# Patient Record
Sex: Female | Born: 1940 | Race: White | Hispanic: No | Smoking: Former smoker
Health system: Southern US, Community
[De-identification: ages and names within clinical notes are randomized; demographics above are authoritative.]

## PROBLEM LIST (undated history)

## (undated) DIAGNOSIS — F32A Depression, unspecified: Secondary | ICD-10-CM

## (undated) DIAGNOSIS — D649 Anemia, unspecified: Secondary | ICD-10-CM

## (undated) DIAGNOSIS — E119 Type 2 diabetes mellitus without complications: Secondary | ICD-10-CM

## (undated) DIAGNOSIS — E039 Hypothyroidism, unspecified: Secondary | ICD-10-CM

## (undated) DIAGNOSIS — Z85038 Personal history of other malignant neoplasm of large intestine: Secondary | ICD-10-CM

## (undated) DIAGNOSIS — C801 Malignant (primary) neoplasm, unspecified: Secondary | ICD-10-CM

## (undated) DIAGNOSIS — N840 Polyp of corpus uteri: Secondary | ICD-10-CM

## (undated) DIAGNOSIS — F329 Major depressive disorder, single episode, unspecified: Secondary | ICD-10-CM

## (undated) DIAGNOSIS — E538 Deficiency of other specified B group vitamins: Secondary | ICD-10-CM

## (undated) HISTORY — DX: Type 2 diabetes mellitus without complications: E11.9

## (undated) HISTORY — PX: CHOLECYSTECTOMY: SHX55

## (undated) HISTORY — DX: Personal history of other malignant neoplasm of large intestine: Z85.038

## (undated) HISTORY — DX: Malignant (primary) neoplasm, unspecified: C80.1

## (undated) HISTORY — DX: Deficiency of other specified B group vitamins: E53.8

## (undated) HISTORY — DX: Anemia, unspecified: D64.9

## (undated) HISTORY — DX: Depression, unspecified: F32.A

## (undated) HISTORY — DX: Hypothyroidism, unspecified: E03.9

## (undated) HISTORY — DX: Polyp of corpus uteri: N84.0

## (undated) HISTORY — DX: Major depressive disorder, single episode, unspecified: F32.9

---

## 1995-10-08 HISTORY — PX: BREAST BIOPSY: SHX20

## 2000-05-02 ENCOUNTER — Encounter: Payer: Self-pay | Admitting: General Surgery

## 2000-05-08 ENCOUNTER — Encounter (INDEPENDENT_AMBULATORY_CARE_PROVIDER_SITE_OTHER): Payer: Self-pay | Admitting: Specialist

## 2000-05-08 ENCOUNTER — Encounter: Payer: Self-pay | Admitting: General Surgery

## 2000-05-09 ENCOUNTER — Encounter: Payer: Self-pay | Admitting: General Surgery

## 2000-05-09 ENCOUNTER — Inpatient Hospital Stay (HOSPITAL_COMMUNITY): Admission: RE | Admit: 2000-05-09 | Discharge: 2000-05-10 | Payer: Self-pay | Admitting: General Surgery

## 2000-09-23 ENCOUNTER — Other Ambulatory Visit: Admission: RE | Admit: 2000-09-23 | Discharge: 2000-09-23 | Payer: Self-pay | Admitting: Obstetrics and Gynecology

## 2004-08-17 ENCOUNTER — Other Ambulatory Visit: Admission: RE | Admit: 2004-08-17 | Discharge: 2004-08-17 | Payer: Self-pay | Admitting: Obstetrics and Gynecology

## 2005-03-18 ENCOUNTER — Emergency Department (HOSPITAL_COMMUNITY): Admission: EM | Admit: 2005-03-18 | Discharge: 2005-03-19 | Payer: Self-pay | Admitting: Emergency Medicine

## 2006-07-23 DIAGNOSIS — Z85038 Personal history of other malignant neoplasm of large intestine: Secondary | ICD-10-CM

## 2006-07-23 HISTORY — PX: COLON SURGERY: SHX602

## 2006-07-23 HISTORY — DX: Personal history of other malignant neoplasm of large intestine: Z85.038

## 2007-01-22 ENCOUNTER — Ambulatory Visit: Payer: Self-pay | Admitting: Internal Medicine

## 2007-01-27 ENCOUNTER — Ambulatory Visit: Payer: Self-pay | Admitting: Internal Medicine

## 2007-01-27 LAB — CONVERTED CEMR LAB
ALT: 12 units/L (ref 0–35)
Albumin: 3.2 g/dL — ABNORMAL LOW (ref 3.5–5.2)
BUN: 17 mg/dL (ref 6–23)
Basophils Absolute: 0.1 10*3/uL (ref 0.0–0.1)
Basophils Relative: 1.1 % — ABNORMAL HIGH (ref 0.0–1.0)
Bilirubin, Direct: 0.1 mg/dL (ref 0.0–0.3)
CO2: 27 meq/L (ref 19–32)
Chloride: 113 meq/L — ABNORMAL HIGH (ref 96–112)
Cholesterol: 162 mg/dL (ref 0–200)
Creatinine, Ser: 1 mg/dL (ref 0.4–1.2)
Eosinophils Relative: 6.3 % — ABNORMAL HIGH (ref 0.0–5.0)
Glucose, Bld: 125 mg/dL — ABNORMAL HIGH (ref 70–99)
HCT: 20.8 % — CL (ref 36.0–46.0)
Hemoglobin, Urine: NEGATIVE
Ketones, ur: NEGATIVE mg/dL
LDL Cholesterol: 114 mg/dL — ABNORMAL HIGH (ref 0–99)
Lymphocytes Relative: 25.9 % (ref 12.0–46.0)
MCHC: 30.2 g/dL (ref 30.0–36.0)
Monocytes Relative: 10 % (ref 3.0–11.0)
Platelets: 557 10*3/uL — ABNORMAL HIGH (ref 150–400)
Potassium: 4.7 meq/L (ref 3.5–5.1)
Sodium: 142 meq/L (ref 135–145)
TSH: 6.82 microintl units/mL — ABNORMAL HIGH (ref 0.35–5.50)
Total Bilirubin: 0.5 mg/dL (ref 0.3–1.2)
Total CHOL/HDL Ratio: 5.4
Total Protein: 6.9 g/dL (ref 6.0–8.3)
WBC: 7.6 10*3/uL (ref 4.5–10.5)
pH: 6 (ref 5.0–8.0)

## 2007-01-29 ENCOUNTER — Inpatient Hospital Stay (HOSPITAL_COMMUNITY): Admission: EM | Admit: 2007-01-29 | Discharge: 2007-01-31 | Payer: Self-pay | Admitting: Emergency Medicine

## 2007-01-29 ENCOUNTER — Ambulatory Visit: Payer: Self-pay | Admitting: Internal Medicine

## 2007-01-30 ENCOUNTER — Ambulatory Visit: Payer: Self-pay | Admitting: Internal Medicine

## 2007-01-31 ENCOUNTER — Encounter (INDEPENDENT_AMBULATORY_CARE_PROVIDER_SITE_OTHER): Payer: Self-pay | Admitting: Gastroenterology

## 2007-04-03 ENCOUNTER — Encounter: Admission: RE | Admit: 2007-04-03 | Discharge: 2007-04-03 | Payer: Self-pay | Admitting: Gastroenterology

## 2007-04-18 ENCOUNTER — Ambulatory Visit: Payer: Self-pay | Admitting: Internal Medicine

## 2007-04-19 ENCOUNTER — Encounter: Payer: Self-pay | Admitting: Internal Medicine

## 2007-04-19 DIAGNOSIS — D509 Iron deficiency anemia, unspecified: Secondary | ICD-10-CM

## 2007-04-19 DIAGNOSIS — E039 Hypothyroidism, unspecified: Secondary | ICD-10-CM

## 2007-05-06 ENCOUNTER — Inpatient Hospital Stay (HOSPITAL_COMMUNITY): Admission: RE | Admit: 2007-05-06 | Discharge: 2007-05-12 | Payer: Self-pay | Admitting: General Surgery

## 2007-05-06 ENCOUNTER — Encounter (INDEPENDENT_AMBULATORY_CARE_PROVIDER_SITE_OTHER): Payer: Self-pay | Admitting: General Surgery

## 2007-05-30 ENCOUNTER — Ambulatory Visit: Payer: Self-pay | Admitting: Hematology and Oncology

## 2007-06-03 ENCOUNTER — Encounter: Payer: Self-pay | Admitting: Internal Medicine

## 2007-06-03 LAB — CBC WITH DIFFERENTIAL/PLATELET
BASO%: 0.5 % (ref 0.0–2.0)
Eosinophils Absolute: 0.4 10*3/uL (ref 0.0–0.5)
HCT: 38 % (ref 34.8–46.6)
MCHC: 34 g/dL (ref 32.0–36.0)
MONO#: 0.6 10*3/uL (ref 0.1–0.9)
NEUT#: 5.3 10*3/uL (ref 1.5–6.5)
NEUT%: 65.6 % (ref 39.6–76.8)
RBC: 4.67 10*6/uL (ref 3.70–5.32)
WBC: 8 10*3/uL (ref 3.9–10.0)
lymph#: 1.8 10*3/uL (ref 0.9–3.3)

## 2007-06-03 LAB — CEA: CEA: 1.4 ng/mL (ref 0.0–5.0)

## 2007-06-03 LAB — COMPREHENSIVE METABOLIC PANEL
ALT: 18 U/L (ref 0–35)
CO2: 24 mEq/L (ref 19–32)
Calcium: 10 mg/dL (ref 8.4–10.5)
Chloride: 103 mEq/L (ref 96–112)
Sodium: 141 mEq/L (ref 135–145)
Total Protein: 7.6 g/dL (ref 6.0–8.3)

## 2007-06-10 ENCOUNTER — Ambulatory Visit (HOSPITAL_COMMUNITY): Admission: RE | Admit: 2007-06-10 | Discharge: 2007-06-10 | Payer: Self-pay | Admitting: Hematology and Oncology

## 2007-07-29 ENCOUNTER — Encounter: Admission: RE | Admit: 2007-07-29 | Discharge: 2007-07-29 | Payer: Self-pay | Admitting: Obstetrics and Gynecology

## 2007-09-01 ENCOUNTER — Ambulatory Visit: Payer: Self-pay | Admitting: Hematology and Oncology

## 2007-09-04 LAB — CBC WITH DIFFERENTIAL/PLATELET
Basophils Absolute: 0 10*3/uL (ref 0.0–0.1)
Eosinophils Absolute: 0.4 10*3/uL (ref 0.0–0.5)
HCT: 38.5 % (ref 34.8–46.6)
HGB: 12.6 g/dL (ref 11.6–15.9)
LYMPH%: 22 % (ref 14.0–48.0)
MCV: 83.9 fL (ref 81.0–101.0)
MONO#: 0.6 10*3/uL (ref 0.1–0.9)
MONO%: 6.7 % (ref 0.0–13.0)
NEUT#: 5.8 10*3/uL (ref 1.5–6.5)
NEUT%: 65.9 % (ref 39.6–76.8)
Platelets: 391 10*3/uL (ref 145–400)
WBC: 8.9 10*3/uL (ref 3.9–10.0)

## 2007-09-05 LAB — COMPREHENSIVE METABOLIC PANEL
ALT: 20 U/L (ref 0–35)
BUN: 14 mg/dL (ref 6–23)
CO2: 25 mEq/L (ref 19–32)
Calcium: 9.6 mg/dL (ref 8.4–10.5)
Chloride: 103 mEq/L (ref 96–112)
Creatinine, Ser: 0.89 mg/dL (ref 0.40–1.20)
Glucose, Bld: 89 mg/dL (ref 70–99)

## 2007-09-05 LAB — CEA: CEA: 1.5 ng/mL (ref 0.0–5.0)

## 2007-09-11 ENCOUNTER — Encounter: Payer: Self-pay | Admitting: Internal Medicine

## 2007-12-25 ENCOUNTER — Ambulatory Visit: Payer: Self-pay | Admitting: Hematology and Oncology

## 2007-12-29 ENCOUNTER — Ambulatory Visit (HOSPITAL_COMMUNITY): Admission: RE | Admit: 2007-12-29 | Discharge: 2007-12-29 | Payer: Self-pay | Admitting: Hematology and Oncology

## 2007-12-29 LAB — CBC WITH DIFFERENTIAL/PLATELET
BASO%: 0.4 % (ref 0.0–2.0)
Eosinophils Absolute: 0.2 10*3/uL (ref 0.0–0.5)
MCHC: 34.2 g/dL (ref 32.0–36.0)
MONO#: 0.6 10*3/uL (ref 0.1–0.9)
NEUT#: 4.2 10*3/uL (ref 1.5–6.5)
RBC: 4.54 10*6/uL (ref 3.70–5.32)
RDW: 13.6 % (ref 11.3–14.5)
WBC: 6.7 10*3/uL (ref 3.9–10.0)

## 2007-12-29 LAB — COMPREHENSIVE METABOLIC PANEL
ALT: 27 U/L (ref 0–35)
Albumin: 4 g/dL (ref 3.5–5.2)
Alkaline Phosphatase: 90 U/L (ref 39–117)
CO2: 26 mEq/L (ref 19–32)
Glucose, Bld: 114 mg/dL — ABNORMAL HIGH (ref 70–99)
Potassium: 4.5 mEq/L (ref 3.5–5.3)
Sodium: 137 mEq/L (ref 135–145)
Total Bilirubin: 0.7 mg/dL (ref 0.3–1.2)
Total Protein: 7.6 g/dL (ref 6.0–8.3)

## 2008-01-01 ENCOUNTER — Encounter: Payer: Self-pay | Admitting: Internal Medicine

## 2008-04-29 ENCOUNTER — Telehealth: Payer: Self-pay | Admitting: Internal Medicine

## 2008-05-07 ENCOUNTER — Ambulatory Visit: Payer: Self-pay | Admitting: Internal Medicine

## 2008-05-12 ENCOUNTER — Encounter: Payer: Self-pay | Admitting: Internal Medicine

## 2008-05-21 ENCOUNTER — Ambulatory Visit: Payer: Self-pay | Admitting: Internal Medicine

## 2008-05-26 ENCOUNTER — Ambulatory Visit: Payer: Self-pay | Admitting: Internal Medicine

## 2008-05-26 DIAGNOSIS — Z85038 Personal history of other malignant neoplasm of large intestine: Secondary | ICD-10-CM | POA: Insufficient documentation

## 2008-06-22 ENCOUNTER — Encounter: Payer: Self-pay | Admitting: Internal Medicine

## 2008-06-24 ENCOUNTER — Ambulatory Visit: Payer: Self-pay | Admitting: Hematology and Oncology

## 2008-06-29 LAB — CBC WITH DIFFERENTIAL/PLATELET
Basophils Absolute: 0 10*3/uL (ref 0.0–0.1)
Eosinophils Absolute: 0.2 10*3/uL (ref 0.0–0.5)
HGB: 13 g/dL (ref 11.6–15.9)
MCV: 87.3 fL (ref 81.0–101.0)
MONO#: 0.7 10*3/uL (ref 0.1–0.9)
MONO%: 8.2 % (ref 0.0–13.0)
NEUT#: 5.4 10*3/uL (ref 1.5–6.5)
RDW: 13.6 % (ref 11.3–14.5)
WBC: 8 10*3/uL (ref 3.9–10.0)
lymph#: 1.8 10*3/uL (ref 0.9–3.3)

## 2008-06-30 LAB — COMPREHENSIVE METABOLIC PANEL
Albumin: 4.5 g/dL (ref 3.5–5.2)
BUN: 14 mg/dL (ref 6–23)
CO2: 26 mEq/L (ref 19–32)
Calcium: 9.6 mg/dL (ref 8.4–10.5)
Chloride: 104 mEq/L (ref 96–112)
Glucose, Bld: 95 mg/dL (ref 70–99)
Potassium: 3.9 mEq/L (ref 3.5–5.3)
Total Protein: 7.5 g/dL (ref 6.0–8.3)

## 2008-06-30 LAB — CEA: CEA: 1.4 ng/mL (ref 0.0–5.0)

## 2008-07-14 ENCOUNTER — Encounter: Payer: Self-pay | Admitting: Internal Medicine

## 2008-09-21 ENCOUNTER — Encounter: Admission: RE | Admit: 2008-09-21 | Discharge: 2008-09-21 | Payer: Self-pay | Admitting: Obstetrics and Gynecology

## 2008-11-24 ENCOUNTER — Ambulatory Visit: Payer: Self-pay | Admitting: Internal Medicine

## 2008-11-26 LAB — CONVERTED CEMR LAB
Albumin: 3.9 g/dL (ref 3.5–5.2)
Alkaline Phosphatase: 113 units/L (ref 39–117)
BUN: 11 mg/dL (ref 6–23)
Bilirubin, Direct: 0.2 mg/dL (ref 0.0–0.3)
CO2: 29 meq/L (ref 19–32)
Chloride: 110 meq/L (ref 96–112)
Creatinine, Ser: 0.8 mg/dL (ref 0.4–1.2)
GFR calc non Af Amer: 76.2 mL/min (ref >60)
Iron: 53 ug/dL (ref 42–145)
Potassium: 4.9 meq/L (ref 3.5–5.1)
Total Protein: 7.7 g/dL (ref 6.0–8.3)
Vitamin B-12: 392 pg/mL (ref 211–911)

## 2008-11-29 ENCOUNTER — Ambulatory Visit: Payer: Self-pay | Admitting: Internal Medicine

## 2008-12-30 ENCOUNTER — Ambulatory Visit: Payer: Self-pay | Admitting: Hematology and Oncology

## 2009-01-03 ENCOUNTER — Ambulatory Visit (HOSPITAL_COMMUNITY): Admission: RE | Admit: 2009-01-03 | Discharge: 2009-01-03 | Payer: Self-pay | Admitting: Hematology and Oncology

## 2009-01-03 LAB — COMPREHENSIVE METABOLIC PANEL
AST: 20 U/L (ref 0–37)
Albumin: 3.9 g/dL (ref 3.5–5.2)
Alkaline Phosphatase: 114 U/L (ref 39–117)
BUN: 10 mg/dL (ref 6–23)
Creatinine, Ser: 0.74 mg/dL (ref 0.40–1.20)
Potassium: 4.2 mEq/L (ref 3.5–5.3)

## 2009-01-03 LAB — CBC WITH DIFFERENTIAL/PLATELET
BASO%: 0.4 % (ref 0.0–2.0)
Basophils Absolute: 0 10*3/uL (ref 0.0–0.1)
EOS%: 2 % (ref 0.0–7.0)
HGB: 13.3 g/dL (ref 11.6–15.9)
MCH: 29.8 pg (ref 25.1–34.0)
MCHC: 34.7 g/dL (ref 31.5–36.0)
MCV: 85.9 fL (ref 79.5–101.0)
MONO%: 7.3 % (ref 0.0–14.0)
RDW: 13.6 % (ref 11.2–14.5)

## 2009-01-04 ENCOUNTER — Telehealth (INDEPENDENT_AMBULATORY_CARE_PROVIDER_SITE_OTHER): Payer: Self-pay | Admitting: *Deleted

## 2009-01-05 ENCOUNTER — Encounter: Payer: Self-pay | Admitting: Internal Medicine

## 2009-02-25 ENCOUNTER — Ambulatory Visit: Payer: Self-pay | Admitting: Internal Medicine

## 2009-02-25 LAB — CONVERTED CEMR LAB
Basophils Absolute: 0 10*3/uL (ref 0.0–0.1)
Chloride: 108 meq/L (ref 96–112)
Creatinine, Ser: 0.8 mg/dL (ref 0.4–1.2)
Eosinophils Absolute: 0.2 10*3/uL (ref 0.0–0.7)
Glucose, Bld: 118 mg/dL — ABNORMAL HIGH (ref 70–99)
Hemoglobin: 13 g/dL (ref 12.0–15.0)
Iron: 40 ug/dL — ABNORMAL LOW (ref 42–145)
MCV: 86.6 fL (ref 78.0–100.0)
Monocytes Absolute: 0.7 10*3/uL (ref 0.1–1.0)
Neutro Abs: 5.3 10*3/uL (ref 1.4–7.7)
Platelets: 323 10*3/uL (ref 150.0–400.0)
Potassium: 3.9 meq/L (ref 3.5–5.1)
RBC: 4.21 M/uL (ref 3.87–5.11)
RDW: 13.5 % (ref 11.5–14.6)
Saturation Ratios: 17.4 % — ABNORMAL LOW (ref 20.0–50.0)
Transferrin: 164.1 mg/dL — ABNORMAL LOW (ref 212.0–360.0)

## 2009-03-02 ENCOUNTER — Ambulatory Visit: Payer: Self-pay | Admitting: Internal Medicine

## 2009-03-02 DIAGNOSIS — R197 Diarrhea, unspecified: Secondary | ICD-10-CM | POA: Insufficient documentation

## 2009-03-02 DIAGNOSIS — L299 Pruritus, unspecified: Secondary | ICD-10-CM | POA: Insufficient documentation

## 2009-03-02 LAB — CONVERTED CEMR LAB
Hemoglobin, Urine: NEGATIVE
Ketones, ur: NEGATIVE mg/dL
Leukocytes, UA: NEGATIVE
Urine Glucose: NEGATIVE mg/dL
Urobilinogen, UA: 0.2 (ref 0.0–1.0)
pH: 6.5 (ref 5.0–8.0)

## 2009-03-04 ENCOUNTER — Encounter: Payer: Self-pay | Admitting: Internal Medicine

## 2009-03-04 LAB — CONVERTED CEMR LAB
5-HIAA, 24 Hr Urine: 2.8 mg/(24.h) (ref <=6.0)
AST: 24 units/L (ref 0–37)
Albumin: 4.1 g/dL (ref 3.5–5.2)
Alkaline Phosphatase: 122 units/L — ABNORMAL HIGH (ref 39–117)
Basophils Relative: 0 % (ref 0.0–3.0)
Bilirubin, Direct: 0.1 mg/dL (ref 0.0–0.3)
CO2: 29 meq/L (ref 19–32)
Dopamine 24 Hr Urine: 114 mcg/24hr (ref <500)
GFR calc non Af Amer: 66.46 mL/min (ref >60)
Hemoglobin: 13.1 g/dL (ref 12.0–15.0)
Metaneph Total, Ur: 328 ug/24hr (ref 224–832)
Metanephrines, Ur: 83 — ABNORMAL LOW (ref 90–315)
Norepinephrine 24 Hr Urine: 35 mcg/24hr (ref <80)
Normetanephrine, 24H Ur: 245 (ref 122–676)
RBC: 4.34 M/uL (ref 3.87–5.11)
Sodium: 138 meq/L (ref 135–145)
Total Protein: 7.7 g/dL (ref 6.0–8.3)
Vitamin B-12: 289 pg/mL (ref 211–911)

## 2009-04-04 ENCOUNTER — Ambulatory Visit: Payer: Self-pay | Admitting: Internal Medicine

## 2009-04-04 DIAGNOSIS — F329 Major depressive disorder, single episode, unspecified: Secondary | ICD-10-CM

## 2009-04-04 DIAGNOSIS — E538 Deficiency of other specified B group vitamins: Secondary | ICD-10-CM

## 2009-04-04 DIAGNOSIS — F3289 Other specified depressive episodes: Secondary | ICD-10-CM | POA: Insufficient documentation

## 2009-05-05 ENCOUNTER — Ambulatory Visit: Payer: Self-pay | Admitting: Internal Medicine

## 2009-09-30 ENCOUNTER — Ambulatory Visit: Payer: Self-pay | Admitting: Hematology and Oncology

## 2009-10-03 ENCOUNTER — Ambulatory Visit: Payer: Self-pay | Admitting: Internal Medicine

## 2009-10-04 LAB — COMPREHENSIVE METABOLIC PANEL
ALT: 15 U/L (ref 0–35)
AST: 16 U/L (ref 0–37)
Albumin: 4.4 g/dL (ref 3.5–5.2)
CO2: 28 mEq/L (ref 19–32)
Chloride: 102 mEq/L (ref 96–112)
Creatinine, Ser: 0.82 mg/dL (ref 0.40–1.20)
Glucose, Bld: 87 mg/dL (ref 70–99)
Potassium: 3.7 mEq/L (ref 3.5–5.3)
Sodium: 141 mEq/L (ref 135–145)
Total Bilirubin: 0.5 mg/dL (ref 0.3–1.2)
Total Protein: 7.3 g/dL (ref 6.0–8.3)

## 2009-10-04 LAB — CBC WITH DIFFERENTIAL/PLATELET
Basophils Absolute: 0 10*3/uL (ref 0.0–0.1)
EOS%: 3.8 % (ref 0.0–7.0)
HCT: 36.9 % (ref 34.8–46.6)
LYMPH%: 21.4 % (ref 14.0–49.7)
MCH: 30.5 pg (ref 25.1–34.0)
MONO%: 8.5 % (ref 0.0–14.0)
Platelets: 345 10*3/uL (ref 145–400)
lymph#: 1.7 10*3/uL (ref 0.9–3.3)

## 2009-10-05 LAB — CONVERTED CEMR LAB
ALT: 19 units/L (ref 0–35)
Alkaline Phosphatase: 104 units/L (ref 39–117)
BUN: 12 mg/dL (ref 6–23)
CO2: 29 meq/L (ref 19–32)
Chloride: 108 meq/L (ref 96–112)
Creatinine, Ser: 0.8 mg/dL (ref 0.4–1.2)
Glucose, Bld: 116 mg/dL — ABNORMAL HIGH (ref 70–99)
Potassium: 4 meq/L (ref 3.5–5.1)
Total Bilirubin: 0.5 mg/dL (ref 0.3–1.2)
Vitamin B-12: 327 pg/mL (ref 211–911)

## 2009-10-06 ENCOUNTER — Encounter: Payer: Self-pay | Admitting: Internal Medicine

## 2009-10-07 ENCOUNTER — Ambulatory Visit: Payer: Self-pay | Admitting: Internal Medicine

## 2009-10-07 DIAGNOSIS — Z87891 Personal history of nicotine dependence: Secondary | ICD-10-CM | POA: Insufficient documentation

## 2009-10-11 ENCOUNTER — Encounter: Admission: RE | Admit: 2009-10-11 | Discharge: 2009-10-11 | Payer: Self-pay | Admitting: Hematology and Oncology

## 2009-10-11 LAB — HM MAMMOGRAPHY: HM Mammogram: NEGATIVE

## 2010-02-03 ENCOUNTER — Ambulatory Visit: Payer: Self-pay | Admitting: Internal Medicine

## 2010-02-03 LAB — CONVERTED CEMR LAB
BUN: 16 mg/dL (ref 6–23)
Basophils Relative: 0.5 % (ref 0.0–3.0)
Eosinophils Absolute: 0.2 10*3/uL (ref 0.0–0.7)
Eosinophils Relative: 2.8 % (ref 0.0–5.0)
HCT: 35.6 % — ABNORMAL LOW (ref 36.0–46.0)
Hemoglobin: 12.4 g/dL (ref 12.0–15.0)
Hgb A1c MFr Bld: 6.1 % (ref 4.6–6.5)
MCV: 87.7 fL (ref 78.0–100.0)
Monocytes Absolute: 0.6 10*3/uL (ref 0.1–1.0)
Monocytes Relative: 9 % (ref 3.0–12.0)
Neutro Abs: 4.2 10*3/uL (ref 1.4–7.7)
Neutrophils Relative %: 61.4 % (ref 43.0–77.0)
Platelets: 297 10*3/uL (ref 150.0–400.0)

## 2010-02-07 ENCOUNTER — Ambulatory Visit: Payer: Self-pay | Admitting: Internal Medicine

## 2010-06-23 ENCOUNTER — Ambulatory Visit: Payer: Self-pay | Admitting: Hematology and Oncology

## 2010-06-27 ENCOUNTER — Ambulatory Visit (HOSPITAL_COMMUNITY)
Admission: RE | Admit: 2010-06-27 | Discharge: 2010-06-27 | Payer: Self-pay | Source: Home / Self Care | Attending: Hematology and Oncology | Admitting: Hematology and Oncology

## 2010-06-27 LAB — CBC WITH DIFFERENTIAL/PLATELET
Basophils Absolute: 0.1 10*3/uL (ref 0.0–0.1)
HCT: 38 % (ref 34.8–46.6)
HGB: 13 g/dL (ref 11.6–15.9)
MCH: 30.1 pg (ref 25.1–34.0)
MCV: 87.8 fL (ref 79.5–101.0)
MONO#: 0.7 10*3/uL (ref 0.1–0.9)
NEUT#: 6.6 10*3/uL — ABNORMAL HIGH (ref 1.5–6.5)
WBC: 9.7 10*3/uL (ref 3.9–10.3)

## 2010-06-27 LAB — COMPREHENSIVE METABOLIC PANEL
ALT: 22 U/L (ref 0–35)
AST: 21 U/L (ref 0–37)
Albumin: 4 g/dL (ref 3.5–5.2)
Calcium: 9.8 mg/dL (ref 8.4–10.5)
Chloride: 99 mEq/L (ref 96–112)
Potassium: 3.6 mEq/L (ref 3.5–5.3)
Sodium: 137 mEq/L (ref 135–145)
Total Bilirubin: 0.5 mg/dL (ref 0.3–1.2)
Total Protein: 7.6 g/dL (ref 6.0–8.3)

## 2010-06-27 LAB — CEA: CEA: 1.5 ng/mL (ref 0.0–5.0)

## 2010-08-24 NOTE — Assessment & Plan Note (Signed)
Summary: 6 mos f/u # cd   Vital Signs:  Patient profile:   70 year old female Weight:      160 pounds BMI:     29.37 Temp:     97.9 degrees F oral Pulse rate:   77 / minute BP sitting:   140 / 70  (left arm)  Vitals Entered By: Tora Perches (October 07, 2009 2:23 PM) CC: f/u Is Patient Diabetic? No   CC:  f/u.  History of Present Illness: The patient presents for a follow up of hypothyroidism, B 12 def, stress. Seeing a councellor.   Preventive Screening-Counseling & Management  Alcohol-Tobacco     Smoking Status: quit  Current Medications (verified): 1)  Synthroid 100 Mcg Tabs (Levothyroxine Sodium) .Marland Kitchen.. 1 Qd 2)  Vitamin B-12 Cr 1000 Mcg  Tbcr (Cyanocobalamin) .... Take One Tablet By Mouth Daily  Allergies: 1)  ! Codeine Phosphate (Codeine Phosphate) 2)  ! 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 ingidient B12 low 2010  Past Surgical History: Last updated: 05/26/2008 Cholecystectomy Colon resection 2008  Social History: Married - stressful marriage Former Smoker Retired Materials engineer  Review of Systems       stressed  Physical Exam  General:  overweight-appearing.   Nose:  External nasal examination shows no deformity or inflammation. Nasal mucosa are pink and moist without lesions or exudates. Mouth:  Oral mucosa and oropharynx without lesions or exudates.  Teeth in good repair. Lungs:  Normal respiratory effort, chest expands symmetrically. Lungs are clear to auscultation, no crackles or wheezes. Heart:  Normal rate and regular rhythm. S1 and S2 normal without gallop, murmur, click, rub or other extra sounds. Abdomen:  Bowel sounds positive,abdomen soft and non-tender without masses, organomegaly or hernias  noted. Msk:  No deformity or scoliosis noted of thoracic or lumbar spine.   Extremities:  No clubbing, cyanosis, edema, or deformity noted with normal full range of motion of all joints.   Neurologic:  No cranial nerve deficits noted. Station and gait are normal. Plantar reflexes are down-going bilaterally. DTRs are symmetrical throughout. Sensory, motor and coordinative functions appear intact. Skin:  Intact without suspicious lesions or rashes Psych:  Cognition and judgment appear intact. Alert and cooperative with normal attention span and concentration.not suicidal and depressed affect.     Impression & Recommendations:  Problem # 1:  DEPRESSION (ICD-311) Assessment Unchanged Refused Rx. Cont w/councelling  Problem # 2:  HYPOTHYROIDISM (ICD-244.9) Assessment: Unchanged  Her updated medication list for this problem includes:    Synthroid 100 Mcg Tabs (Levothyroxine sodium) .Marland Kitchen... 1 qd  Problem # 3:  ANEMIA-IRON DEFICIENCY (ICD-280.9)  The following medications were removed from the medication list:    Ferrous Sulfate 325 (65 Fe) Mg Tbec (Ferrous sulfate) .Marland Kitchen... 1 by mouth qd Her updated medication list for this problem includes:    Vitamin B-12 Cr 1000 Mcg Tbcr (Cyanocobalamin) .Marland Kitchen... Take one tablet by mouth daily  Problem # 4:  VITAMIN B12 DEFICIENCY (ICD-266.2) Assessment: Comment Only Risks of noncompliance with treatment discussed. Compliance encouraged.   Complete Medication List: 1)  Synthroid 100 Mcg Tabs (Levothyroxine sodium) .Marland Kitchen.. 1 qd 2)  Vitamin B-12 Cr 1000 Mcg Tbcr (Cyanocobalamin) .... Take one tablet by mouth daily 3)  Vitamin D 1000 Unit Tabs (Cholecalciferol) .Marland KitchenMarland KitchenMarland Kitchen 1  by mouth qd  Patient Instructions: 1)  Please schedule a follow-up appointment in 4 months. 2)  BMP prior to visit, ICD-9: 3)  CBC w/ Diff prior to visit, ICD-9: 4)  HbgA1C prior to visit, ICD-9:790.29  995.20 266.20 5)  Try to eat more raw plant food, fresh and dry fruit, raw almonds, leafy  vegetables, whole foods and less red meat, less animal fat. Poultry and fish is better for you than pork and beef. Avoid processed foods (canned soups, hot dogs, sausage, bacon , frozen dinners). Avoid corn syrup, high fructose syrup or aspartam and Splenda  containing drinks. Honey, Agave and Stevia are better sweeteners. Make your own  dressing with olive oil, wine vinegar, lemon juce, garlic etc. for your salads.

## 2010-08-24 NOTE — Assessment & Plan Note (Signed)
Summary: 4 mos f/u / #/ cd   Vital Signs:  Patient profile:   70 year old female Height:      62 inches (157.48 cm) Weight:      156 pounds (70.91 kg) BMI:     28.64 O2 Sat:      96 % on Room air Temp:     98.3 degrees F (36.83 degrees C) oral Pulse rate:   74 / minute Pulse rhythm:   regular Resp:     16 per minute BP sitting:   140 / 82  (right arm) Cuff size:   large  Vitals Entered By: Lanier Prude, CMA(AAMA) (February 07, 2010 1:28 PM)  O2 Flow:  Room air CC: 4 mo f/u Is Patient Diabetic? No Comments pt is not taking Vit B12   CC:  4 mo f/u.  History of Present Illness: The patient presents for a follow up of hypertension, depression, Vit B12 C/o stress   Current Medications (verified): 1)  Synthroid 100 Mcg Tabs (Levothyroxine Sodium) .Marland Kitchen.. 1 Qd 2)  Vitamin B-12 Cr 1000 Mcg  Tbcr (Cyanocobalamin) .... Take One Tablet By Mouth Daily 3)  Vitamin D 1000 Unit Tabs (Cholecalciferol) .Marland Kitchen.. 1 By Mouth Qd 4)  Calcium Gluconate 500 Mg Tabs (Calcium Gluconate) .... 2 Once Daily  Allergies (verified): 1)  ! Codeine Phosphate (Codeine Phosphate) 2)  ! Penicillin V Potassium (Penicillin V Potassium)  Past History:  Past Medical History: Last updated: 04/04/2009 Hypothyroidism Anemia-iron deficiency from GI bleed 08 Colon cancer, hx of 2008  Dr  Purnell Shoemaker, Dr [facility] Uterine polyp Dr Rana Snare Depression Food allergy to ? ingidient B12 low 2010  Past Surgical History: Last updated: 05/26/2008 Cholecystectomy Colon resection 2008  Social History: Last updated: 10/07/2009 Married - stressful marriage Former Smoker Retired Materials engineer  Review of Systems       The patient complains of depression.  The patient denies anorexia, fever, weight loss, weight gain, dyspnea on exertion, prolonged cough, and abdominal pain.    Physical Exam  General:  overweight-appearing.   Nose:  External nasal examination shows no deformity or inflammation. Nasal mucosa are pink  and moist without lesions or exudates. Mouth:  Oral mucosa and oropharynx without lesions or exudates.  Teeth in good repair. Neck:  No deformities, masses, or tenderness noted. Lungs:  Normal respiratory effort, chest expands symmetrically. Lungs are clear to auscultation, no crackles or wheezes. Heart:  Normal rate and regular rhythm. S1 and S2 normal without gallop, murmur, click, rub or other extra sounds. Abdomen:  Bowel sounds positive,abdomen soft and non-tender without masses, organomegaly or hernias noted. Msk:  No deformity or scoliosis noted of thoracic or lumbar spine.   Extremities:  No clubbing, cyanosis, edema, or deformity noted with normal full range of motion of all joints.   Neurologic:  No cranial nerve deficits noted. Station and gait are normal. Plantar reflexes are down-going bilaterally. DTRs are symmetrical throughout. Sensory, motor and coordinative functions appear intact. Skin:  Intact without suspicious lesions or rashes Psych:  Cognition and judgment appear intact. Alert and cooperative with normal attention span and concentration.not suicidal and depressed affect.     Impression & Recommendations:  Problem # 1:  VITAMIN B12 DEFICIENCY (ICD-266.2) Assessment Improved  Restart Rx  Orders: Vit B12 1000 mcg (J3420) Admin of Therapeutic Inj  intramuscular or subcutaneous (62130)  Problem # 2:  DEPRESSION (ICD-311) Assessment: Deteriorated  Her updated medication list for this problem includes:    Fluoxetine Hcl 10 Mg  Tabs (Fluoxetine hcl) .Marland Kitchen... 1 by mouth once daily    Lorazepam 0.5 Mg Tabs (Lorazepam) .Marland Kitchen... 1 by mouth two times a day as needed anxiety  Problem # 3:  HYPOTHYROIDISM (ICD-244.9) Assessment: Improved  Her updated medication list for this problem includes:    Synthroid 100 Mcg Tabs (Levothyroxine sodium) .Marland Kitchen... 1 qd  Problem # 4:  COLON CANCER, HX OF (ICD-V10.05) Assessment: Comment Only  Problem # 5:  ANEMIA-IRON DEFICIENCY  (ICD-280.9) Assessment: Improved  Her updated medication list for this problem includes:    Vitamin B-12 Cr 1000 Mcg Tbcr (Cyanocobalamin) .Marland Kitchen... Take one tablet by mouth daily  Hgb: 12.4 (02/03/2010)   Hct: 35.6 (02/03/2010)   Platelets: 297.0 (02/03/2010) RBC: 4.06 (02/03/2010)   RDW: 13.8 (02/03/2010)   WBC: 6.8 (02/03/2010) MCV: 87.7 (02/03/2010)   MCHC: 34.9 (02/03/2010) Iron: 40 (02/25/2009)   % Sat: 17.4 (02/25/2009) B12: 327 (10/03/2009)   TSH: 2.08 (11/24/2008)  Complete Medication List: 1)  Synthroid 100 Mcg Tabs (Levothyroxine sodium) .Marland Kitchen.. 1 qd 2)  Vitamin B-12 Cr 1000 Mcg Tbcr (Cyanocobalamin) .... Take one tablet by mouth daily 3)  Vitamin D 1000 Unit Tabs (Cholecalciferol) .Marland Kitchen.. 1 by mouth qd 4)  Calcium Gluconate 500 Mg Tabs (Calcium gluconate) .... 2 once daily 5)  Fluoxetine Hcl 10 Mg Tabs (Fluoxetine hcl) .Marland Kitchen.. 1 by mouth once daily 6)  Lorazepam 0.5 Mg Tabs (Lorazepam) .Marland Kitchen.. 1 by mouth two times a day as needed anxiety  Patient Instructions: 1)  Please schedule a follow-up appointment in 3 months. Prescriptions: LORAZEPAM 0.5 MG TABS (LORAZEPAM) 1 by mouth two times a day as needed anxiety  #30 x 1   Entered and Authorized by:   Tresa Garter MD   Signed by:   Tresa Garter MD on 02/07/2010   Method used:   Print then Give to Patient   RxID:   [id number] FLUOXETINE HCL 10 MG TABS (FLUOXETINE HCL) 1 by mouth once daily  #30 x 6   Entered and Authorized by:   Tresa Garter MD   Signed by:   Tresa Garter MD on 02/07/2010   Method used:   Print then Give to Patient   RxID:   [id number]    Medication Administration  Injection # 1:    Medication: Vit B12 1000 mcg    Diagnosis: VITAMIN B12 DEFICIENCY (ICD-266.2)    Route: IM    Site: L deltoid    Exp Date: 10/22/2011    Lot #: [id number]    Mfr: [employer]    Patient tolerated injection without complications    Given by: Lanier Prude, CMA(AAMA) (February 07, 2010 2:18  PM)  Orders Added: 1)  Vit B12 1000 mcg [J3420] 2)  Admin of Therapeutic Inj  intramuscular or subcutaneous [96372] 3)  Est. Patient Level IV [29528]

## 2010-08-24 NOTE — Miscellaneous (Signed)
[facility]   Imported By: Lester Shelby 10/14/2009 09:44:05  _____________________________________________________________________  External Attachment:    Type:   Image     Comment:   External Document

## 2010-08-24 NOTE — Letter (Signed)
[facility]   Imported By: Lennie Odor 10/20/2009 15:29:17  _____________________________________________________________________  External Attachment:    Type:   Image     Comment:   External Document

## 2010-11-15 ENCOUNTER — Other Ambulatory Visit: Payer: Self-pay | Admitting: Internal Medicine

## 2010-11-15 DIAGNOSIS — Z1231 Encounter for screening mammogram for malignant neoplasm of breast: Secondary | ICD-10-CM

## 2010-11-24 ENCOUNTER — Ambulatory Visit
Admission: RE | Admit: 2010-11-24 | Discharge: 2010-11-24 | Disposition: A | Discharge status: Home / Self Care | Payer: Medicare Other | Source: Ambulatory Visit | Attending: Internal Medicine | Admitting: Internal Medicine

## 2010-11-24 DIAGNOSIS — Z1231 Encounter for screening mammogram for malignant neoplasm of breast: Secondary | ICD-10-CM

## 2010-11-29 ENCOUNTER — Other Ambulatory Visit: Payer: Self-pay | Admitting: Internal Medicine

## 2010-12-05 NOTE — Discharge Summary (Signed)
NAMECHRISHAWNA, Whitney [name]          ACCOUNT NO.:  [id number]   MEDICAL RECORD NO.:  [id number]          PATIENT TYPE:  INP   LOCATION:  1405                         FACILITY[facility]  [facility]   PHYSICIAN:  Georgina Quint. Plotnikov, MDDATE OF BIRTH:  21-Jan-1941   DATE OF ADMISSION:  01/29/2007  DATE OF DISCHARGE:                               DISCHARGE SUMMARY   DISCHARGE DIAGNOSES:  1. Profound anemia, likely of blood loss, unknown source.  2. Weakness and dyspnea due to problem #1.  3. Upper respiratory tract infection/bronchitis.  4. Hypothyroidism.   DISCHARGE MEDICATIONS:  1. Levoxyl 100 mg daily.  2. Levaquin to finish at home (three days left).  3. Protonix 40 mg daily for two months.  4. Niferex Forte 1 daily for anemia.   FOLLOW-UP PLANS:  1. Dr. Ewing Schlein in 1-3 weeks. Capsule video enteroscopy; colonoscopy      recommended as an outpatient.  2. Dr. Posey Rea in two months with blood work.   CONSULTATIONS:  GI consult, Dr. Matthias Hughs.   PROCEDURES:  EGD on January 31, 2007 with normal results.  RBC transfusion 4u.   DISCHARGE INSTRUCTIONS:  Increase activity slowly.  To work on February 03, 2007 if okay.  Call if problems.   HISTORY:  The patient is a 70 year old female who was hospitalized for  profound weakness and severe anemia.  It was thought that the source of  anemia was gastrointestinal blood loss.  Dr. Matthias Hughs saw the patient in  the hospital.  EGD was performed on the day of discharge without acute  findings.   DISCHARGE PHYSICAL EXAMINATION:  GENERAL:  On the day of discharge, she  is feeling well.  She is not in any distress.  VITAL SIGNS:  Temperature 96.7, heart rate 69, respirations 20, blood  pressure 129/68.  Sats 98% on room air. Hemoglobin 11.3.  HEENT:  No pallor.  Moist mucosa.  NECK:  Supple.  No thyromegaly or bruit.  LUNGS:  Clear.  No wheezes or rales.  HEART:  S1 and S2.  No murmur, no gallop.  ABDOMEN:  Soft and nontender.  No organomegaly.  No masses felt.  LOWER EXTREMITIES:  Without edema.  NEUROLOGIC:  She is alert, oriented, and cooperative.   LABS:  Normal.  Sodium 134, potassium 3.8, glucose 124.  White count  11.8; originally with a hemoglobin of 6.5.  Iron 10, TIBC 317, percent  saturation 3.      Georgina Quint. Plotnikov, MD  Electronically Signed     AVP/MEDQ  D:  01/31/2007  T:  02/01/2007  Job:  [id number]   cc:   Petra Kuba, M.D.  Fax: [phone]

## 2010-12-05 NOTE — Consult Note (Signed)
NAMEMICHALINA, Whitney [name]          ACCOUNT NO.:  [id number]   MEDICAL RECORD NO.:  [id number]          PATIENT TYPE:  INP   LOCATION:  1405                         [facility]ACILITY:  [facility]   PHYSICIAN:  Bernette Redbird, M.D.   DATE OF BIRTH:  1940/10/22   DATE OF CONSULTATION:  DATE OF DISCHARGE:                                 CONSULTATION   REFERRING PHYSICIAN:  Dr. Raenette Rover. Leschber.   REASON FOR CONSULTATION:  Dr. Matthias Hughs was asked to see Whitney [name]  today in consultation for severe iron deficiency anemia by Dr. Felicity Coyer  of [facility]   HISTORY OF PRESENT ILLNESS:  This is a 70 year old female with a history  of a cholecystectomy and subsequent ERCP for common bile duct stone.  She presented on July 9 with a hemoglobin of 6.4 and an MCV value of  61.9/  She reports abdominal pain in her left upper quadrant that she  associates with her chronic cough, congestion and sinus problems.  She  reports positive for fatigue and weakness for 2 months, chronic cough  for many months.  She says her cough awakens her from sleep and she  feels like she has continuous sinus drainage.  She denies any change in  her bowel habits; she says that her bowel habits are relatively  __________ .  She denies any melena or hematochezia as well as vomiting  and hematemesis.  She denies any dysphasia, indigestion or heartburn.  She denies use of any aspirin or NSAIDs including Alka-Seltzer.   PAST MEDICAL HISTORY:  Significant for a colonoscopy that was done by  Dr. Ewing Schlein on April 02, 2005, which was negative.  She has had a  previous cholecystectomy in October 2001 followed by a subsequent ERCP  for a common bile duct stone in October 2001.  She had a cesarean  section at 70 years old.  She has a history of osteoporosis and  hypothyroidism.   PRIMARY CARE PHYSICIAN:  Dr. Posey Rea.   CURRENT MEDICATIONS:  Include Levaquin, which she has almost completed,  Synthroid, calcium and a supplement called  Q-10.   ALLERGIES:  She has allergies to CODEINE and PENICILLIN.   REVIEW OF SYSTEMS:  Significant for her chronic sinus congestion and  cough.   SOCIAL HISTORY:  She denies any alcohol or tobacco.  She is married and  has 1 son.   FAMILY HISTORY:  Significant for a first cousin with colon cancer.  No  ulcer disease in her family, but her mother did have a hiatal hernia.   PHYSICAL EXAM:  GENERAL:  She is alert and oriented, very pleasant to  speak with.  VITAL SIGNS:  Her temperature is 97.5, pulse 63, respirations are 16 and  blood pressure is 123/66.  HEART:  Regular rate and rhythm with no murmurs, rubs or gallops  appreciated.  LUNGS:  Clear to auscultation with no wheezes or crackles appreciated.  ABDOMEN:  Soft, nontender and nondistended with good bowel sounds.  RECTAL:  She did have external hemorrhoids; they were nonthrombosed.  She has brown stool in her rectal vault which was guaiac-negative.   ACCESSORY CLINICAL DATA:  On January 22, 2007, she had a chest x-ray done  that shows no active cardiopulmonary disease.   ASSESSMENT:  Dr. Molly Maduro Buccini has seen and examined the patient and  collected a history.  He lists the following as her gastrointestinal  issues:  1. Profound microcytic anemia, heme-negative at present, negative      colon 2 years ago, no clear localizing symptoms and no use of      aspirin or nonsteroidal anti-inflammatory drugs.  2. Nocturnal cough possibly gastroesophageal reflux disease.  3. Fleeting abdominal pain.   PLAN:  1. Check serum tissue transglutaminase.  2. EGD in the morning with Dr. Charlott Rakes.  Consider small-bowel      biopsies, if EGD is negative.  Possibly an outpatient repeat      colonoscopy plus or minus a capsule endoscopy of the small bowel.  3. Continue proton pump inhibitor for 2 months.   Thank you very much for this consultation.      Stephani Police, PA    ______________________________  Bernette Redbird,  M.D.    MLY/MEDQ  D:  01/30/2007  T:  01/31/2007  Job:  [id number]   cc:   Georgina Quint. Plotnikov, MD  [address]  [place]  [place] [address]   Vikki Ports A. Felicity Coyer, MD  [address] [place], [place] [address]   Petra Kuba, M.D.  Fax: [phone]   Bernette Redbird, M.D.  Fax: [phone]

## 2010-12-05 NOTE — H&P (Signed)
[name], [name]          ACCOUNT NO.:  [id number]   MEDICAL RECORD NO.:  [id number]          PATIENT TYPE:  INP   LOCATION:  1405                         FACILITY:  [facility]   PHYSICIAN:  Whitney [name], MDDATE OF BIRTH:  [birth year]   DATE OF ADMISSION:  01/29/2007  DATE OF DISCHARGE:                              HISTORY & PHYSICAL   CHIEF COMPLAINT:  Weakness.   HISTORY OF PRESENT ILLNESS:  The patient is a 70 year old female who was  seen recently in the office with the complaint of being tired over a  period of the last 6-8 weeks.  Denies chest pain, short of breath with  exertion.  Some vague lower abdominal discomfort.  Denies black tarry-  looking stool or bright red blood in the stool.  No nausea or vomiting.  No significant indigestion.   MEDICATIONS:  1. Synthroid 100 mcg q day.  2. Folic acid.  3. No excessive antiinflammatories.   ALLERGIES:  1. PENICILLIN.  2. CODEINE.   PAST MEDICAL HISTORY:  1. Hypothyroidism.  2. Cholecystectomy in 2001.  3. History of elevated glucose.   SOCIAL HISTORY:  She is married.  Does not smoke or drink.   FAMILY HISTORY:  Positive for gallstones.   REVIEW OF SYSTEMS:  No chest pain.  Abdominal pain as described above.  No urinary symptoms.  No melena.  No bright red blood in bowel  movements.  No syncope. Colonoscopy 2 years ago or so. The rest of the  18-point review of systems is negative.   PHYSICAL EXAMINATION:  VITAL SIGNS:  Blood pressure 151/75, pulse 88,  temperature 96.9, weight 166 pounds.  GENERAL:  She is in no acute distress.  Appears tired and slightly pale.  HEENT:  With moist mucosa.  NECK:  Supple.  No thyromegaly or bruits.  LUNGS:  Clear.  No wheezes or rales.  HEART:  S1, S2.  Grade 2/6 systolic murmur.  Slight tachycardia.  ABDOMEN:  Soft, nondistended.  No masses felt.  Bowel sounds present.  RECTAL:  Declined.  LOWER EXTREMITIES:  Without edema.  Calves nontender.  SKIN:  Clear.  No  rashes.  NEUROLOGIC:  She is alert, oriented, and cooperative.   LABORATORIES:  On January 27, 2007, hemoglobin 6.3, MCV 62.3, platelets  567,000, white cell count 7600.  Glucose 125, potassium 4.7.  Liver  tests normal.  Cholesterol 162, LDL 114.  A1c 5.9%.  TSH 6.82.  Urinalysis normal.   ASSESSMENT AND PLAN:  1. Profound subacute anemia, microcytic, most likely related to GI      blood loss.  GI consultation, Dr. Ewing [name].  Will transfuse with 4      units of red blood cells.  Premedicate with Benadryl and Tylenol.  2. Hypothyroidism, with elevated TSH.  Continue with the same dose of      thyroid replacement at present.  3. Profound fatigue due to the above.  4. Abdominal discomfort, lower.  Plan as above.  5. Upper respiratory infection.  She is finishing Levaquin orally.      Will continue for another 3 days.      Whitney Quint. Plotnikov,  MD  Electronically Signed     AVP/MEDQ  D:  01/29/2007  T:  01/30/2007  Job:  [id number]

## 2010-12-05 NOTE — Op Note (Signed)
Whitney [name], Whitney [name]          ACCOUNT NO.:  [id number]   MEDICAL RECORD NO.:  [id number]          PATIENT TYPE:  INP   LOCATION:  1405                         [facility]   PHYSICIAN:  Shirley Friar, MDDATE OF BIRTH:  1941/02/10   DATE OF PROCEDURE:  DATE OF DISCHARGE:                               OPERATIVE REPORT   UPPER ENDOSCOPY:   INDICATION:  Anemia.   MEDICATIONS:  Fentanyl 75 mcg IV, Versed 6 mg IV.   FINDINGS:  The endoscope was inserted into the oropharynx and the  esophagus was intubated, which was normal in its entirety.  The GE  junction was noted to be approximately 40 cm from incisors.  The  endoscope was advanced into the stomach, which  revealed normal stomach  mucosa throughout the stomach.  Retroflexion was done, which revealed a  normal proximal stomach.  The endoscope was straightened and advanced  into the duodenal bulb, which was normal in appearance.  It was then  advanced into the second portion of duodenum, which revealed normal-  appearing mucosa.  Six random biopsies were taken in the second portion  of the duodenum.  The endoscope was withdrawn to confirmed above  findings.   ASSESSMENT:  Normal esophagogastroduodenoscopy, status post duodenal  biopsies to evaluate for celiac sprue.   PLAN:  1. Follow up on pathology.  2. If pathology unrevealing, then the patient likely will need a small      bowel capsule endoscopy to further evaluate anemia.  Also may need      to consider repeat colonoscopy in the near future.      Shirley Friar, MD  Electronically Signed     VCS/MEDQ  D:  01/31/2007  T:  02/01/2007  Job:  [id number]   cc:   Georgina Quint. Plotnikov, MD  [address]  [place]  [place] [address]   Petra Kuba, M.D.  Fax: [phone]

## 2010-12-05 NOTE — Op Note (Signed)
Whitney [name], Whitney [name]          ACCOUNT NO.:  [id number]   MEDICAL RECORD NO.:  [id number]          PATIENT TYPE:  INP   LOCATION:  1525                         FACILITY:  [facility]   PHYSICIAN:  Adolph Pollack, M.D.DATE OF BIRTH:  Mar 31, 1941   DATE OF PROCEDURE:  05/06/2007  DATE OF DISCHARGE:                               OPERATIVE REPORT   PREOPERATIVE DIAGNOSIS:  Cecal cancer.   POSTOPERATIVE DIAGNOSIS:  Cecal cancer.   PROCEDURE:  Laparoscopic-assisted right colectomy.   SURGEON:  Adolph Pollack, M.D.   ASSISTANT:  Ollen Gross. Vernell Morgans, M.D.   ANESTHESIA:  General.   INDICATIONS FOR PROCEDURE:  Whitney [name] is a 70 year old female with  progressively increasing fatigue and was noted be profoundly anemic.  Workup included endoscopies.  Colonoscopy demonstrated a lesion in the  cecum that was biopsied and positive for adenocarcinoma.  She now  presents for the above procedure.   TECHNIQUE:  She was seen in the holding area and then brought to the  operating room, placed on the operating table, and general anesthetic  was administered.  A Foley catheter was inserted.  The abdominal wall  was sterilely prepped and draped.   A small incision was made in the left upper quadrant subcostal region.  Using a 5-mm Optiview, access was gained into the peritoneal cavity and  pneumoperitoneum was created.  Careful inspection below the trocar site  demonstrated no injury to intra-abdominal organs and no bleeding.   Following this, a 5-mm trocar was placed in the left midabdomen, 1 in  the subumbilical region, and 1 in the right lower quadrant.  We began by  inspecting the right colon and cecum and mobilizing it by dividing its  lateral attachments using blunt dissection to sweep the right colon  medially.  There were omental adhesions to the liver from previous  laparoscopic cholecystectomy, and these were taken down sharply.  The  hepatic flexure was then mobilized by dividing  its peritoneal  attachments.  I was able to mobilize the colon from the right side to  the mid transverse area so it could be pulled under the umbilicus.   Following this, I removed the subumbilical trocar and made a  periumbilical extraction site incision, dividing the skin, subcutaneous  tissue, fascia, and peritoneum.  A wound protection device was placed.  The distal ileum, right colon, and proximal half of the transverse colon  were then extracted through the incision.  I picked 2 points of  division, 1 in the proximal third of the transverse colon and the other  in the distal ileum proximal to the ileocecal valve.  I then divided the  mesentery in a wedge-shape using the LigaSure device as well as tying  the right colic and ileocolic vessels.  Following this, I then created a  side-to-side anastomosis between the distal ileum and transverse colon  using the GIA stapler.  I closed the common defect and completed the  resection using the linear non-cutting stapler.  The specimen was handed  off the field.   A crotch suture was placed.  I inspected the anastomosis.  It was  patent, viable, and under no tension.  There was no bleeding.   Gloves were changed.  I dropped the intestine back into the abdominal  cavity.  Irrigation was performed and fluid evacuated.  Needle, sponge,  and instrument counts were reported to be correct at this time.   The extraction site fascia was then closed with running #1 PDS suture.  The abdomen was reinsufflated, and the laparoscope was reintroduced.  I  noted some blood clots in the right upper quadrant region where I  mobilized the omentum off the liver.  I evacuated all these, and there  was a little bit of bleeding from the some of the appendices epiploica  off the transverse colon and some of the omentum which I controlled with  electrocautery.  I irrigated this area and inspected it very carefully,  and hemostasis was now adequate.  I evacuated  as much of the irrigation  fluid as possible.  I inspected the midline closure, and it was solid.   Following this, I removed the trocars and released the pneumoperitoneum.  All skin incisions were then closed with staples, and sterile dressings  were applied.   She tolerated the procedure without any apparent complications and was  taken to recovery in satisfactory condition.      Adolph Pollack, M.D.  Electronically Signed     TJR/MEDQ  D:  05/06/2007  T:  05/06/2007  Job:  [id number]   cc:   Whitney [name], M.D.  Fax: [phone]   Whitney Quint. Plotnikov, MD  [address]  [place]  [place] [address]

## 2010-12-05 NOTE — Discharge Summary (Signed)
Whitney [name], Whitney [name]          ACCOUNT NO.:  [id number]   MEDICAL RECORD NO.:  [id number]          PATIENT TYPE:  INP   LOCATION:  1525                         FACILITY:  [facility]   PHYSICIAN:  Adolph Pollack, M.D.DATE OF BIRTH:  [birth year]   DATE OF ADMISSION:  05/06/2007  DATE OF DISCHARGE:  05/12/2007                               DISCHARGE SUMMARY   PRINCIPAL DIAGNOSIS:  Stage II cecal cancer.   SECONDARY DIAGNOSES:  1. Postoperative ileus.  2. Hypothyroidism.  3. Asthma.  4. Acute on chronic anemia.   PROCEDURE:  Laparoscopic-assisted right colectomy May 06, 2007.   REASON FOR ADMISSION:  This 70 year old female was noted to have anemia  and workup for the anemia included a colonoscopy.  She had a lesion in  the cecum that was biopsied and positive for adenocarcinoma.  She was  admitted for the above procedure.   HOSPITAL COURSE:  She underwent laparoscopic-assisted right colectomy  and tolerated it well.  She did have a low hemoglobin preop and 17.7  postop and was given blood transfusion and hemoglobin rose to 9.9.  Pathology was T3N0 lesion consistent with stage II.  She had somewhat of  an ileus but this slowly started to improve.  By her 6th postoperative  day she was having bowel function, tolerating her diet, wound was clean,  dry and intact and she was able to be discharged.   DISPOSITION:  Discharged to home in satisfactory condition on May 12, 2007.  She was given discharge instructions and analgesic.  She will  follow up in the office in 2 to 3 weeks.      Adolph Pollack, M.D.  Electronically Signed     TJR/MEDQ  D:  06/06/2007  T:  06/06/2007  Job:  [id number]   cc:   Petra Kuba, M.D.  Fax: [phone]   Georgina Quint. Plotnikov, MD  [address]  [place]  [place] [address]

## 2010-12-05 NOTE — Discharge Summary (Signed)
Whitney [name], Whitney [name]          ACCOUNT NO.:  [id number]   MEDICAL RECORD NO.:  [id number]          PATIENT TYPE:  INP   LOCATION:  1405                         FACILITY:  [facility]   PHYSICIAN:  Georgina Quint. Plotnikov, MDDATE OF BIRTH:  1941-02-15   DATE OF ADMISSION:  01/29/2007  DATE OF DISCHARGE:  01/31/2007                               DISCHARGE SUMMARY   ADDENDUM TO JOB #[id number]:  __________      Georgina Quint. Plotnikov, MD  Electronically Signed     AVP/MEDQ  D:  01/31/2007  T:  02/02/2007  Job:  [id number]   cc:   Petra Kuba, M.D.  Fax: [phone]

## 2010-12-08 NOTE — Op Note (Signed)
[place]  Patient:    Whitney [name], Whitney [name]                 MRN: [id number] Proc. Date: 05/08/00 Adm. Date:  [id number] Attending:  Glenna Fellows Tappan                           Operative Report  PREOPERATIVE DIAGNOSES: 1. Cholelithiasis, cholecystitis. 2. Abnormal liver function tests.  POSTOPERATIVE DIAGNOSES: 1. Cholelithiasis, cholecystitis. 2. Abnormal liver function tests.  SURGICAL PROCEDURE:  Laparoscopic cholecystectomy with interoperative cholangiogram and core needle biopsy of the liver.  SURGEON:  Lorne Skeens. Hoxworth, M.D.  ASSISTANT:  Dr. Derrell Lolling  HISTORY OF PRESENT ILLNESS:  Ms. Marga Hoots is a 70 year old white female who has an approximately 2 year history of gradually worsening episodic upper abdominal pain associated with nausea.  She has also recently been found to have elevated liver function tests in a cholestatic pattern.  She has had extensive workup including bilateral ultrasound which shows some rounded mobile filling defects with nonshadowing felt to be likely secondary to gallstones.  She has had an extensive work-up for abnormal liver function tests that has been unrevealing and she is felt likely to have a cholestatic process based on her pattern of elevated liver function tests rather than common duct obstruction.  With her symptoms and abnormal gallbladder ultrasound and elevated liver function tests we have elected to proceed with laparoscopic cholecystectomy with intraoperative cholangiogram and core needle liver biopsy.  INDICATIONS:  The nature of the procedures, its indications, risks of bleeding, infection, bile leak, bile duct injury were discussed and understood preoperatively.  She is now brought to the operating room for this procedure.  DESCRIPTION OF PROCEDURE:  The patient was brought to the operating room, placed in the supine position on the operating table and general  endotracheal anesthesia was induced.  The abdomen was sterilely prepped and draped.  PSA hose were in place.  The patient received antibiotics preoperatively.  Local anesthesia was used to infiltrate the trocar sites.  A 1 cm incision was made in the umbilicus and dissection carried down to the midline fascia.  This was sharply incised for 1 cm and the peritoneum identified under direct vision.  A Hasson trocar was placed with a pursestring suture of 0 Vicryl and pneumoperitoneum established.  Under direct vision a 10 mm trocar was placed in the sub-xiphoid and two 5 mm trocars, one in the right subcostal margin. There were some inflammatory appearing adhesions of omentum up to the gallbladder that were taken down sharply and with cautery.  The gallbladder appeared somewhat contracted and chronically thickened and inflamed.  The fundus was grasped and the infundibulum retracted inferolaterally.  Fibrofatty tissue was stripped off the neck of the gallbladder and the toward the porta hepatis.  The cystic duct was identified and was dissected free for about 1 cm and cystic duct gallbladder junction dissected 360 degrees.  Following this operative operative cholangiograms were obtained through the cystic duct.  This showed a upper limits of normal common bile duct and intrahepatic ducts with free flow into the duodenum.  There was a questionable small filling defect at the distal common bile duct which did not persist throughout the entire series.  Following this, the cholangiocatheter was removed and the cystic duct was doubly clipped proximally and divided.  The gallbladder was then dissected free from its bed using hook and spatula cautery.  Anterior and posterior  branches of the cystic artery were identified coursing up the gallbladder wall and were divided between clips.  There appeared to be a lot of chronic inflammation posteriorly along the gallbladder wall which made the  dissection somewhat tedious but the gallbladder was removed laparoscopically through an endocatch bag.  The gallbladder fossa was irrigated and complete hemostasis was assured with cautery and Surgicel pack placed.  Following this two core biopsies of the right lobe of the liver were obtained through a small separate skin stabilized wound.  Good cores were obtained. Bleeding was controlled with cautery and a Surgicel patch placed over this. The right upper quadrant was then irrigated thoroughly and inspected for hemostasis which appeared complete. Trocars were removed under direct vision, and all CO2 evacuated from the peritoneal cavity.  A pursestring suture was carried was secured at the umbilicus.  Skin incisions were closed with interrupted subcuticular 4-0 Monocryl and Steri-Strips.  Sponge, needle and instrument counts were correct.  Dry sterile dressing was applied, and the patient taken to the recovery in good condition. DD:  05/08/00 TD:  05/08/00 Job: [id number]

## 2010-12-08 NOTE — Procedures (Signed)
[facility]  Patient:    Whitney [name], Whitney [name]                 MRN: [id number] Adm. Date:  [id number] Attending:  Delsa Bern CC:         Lorne Skeens. Hoxworth, M.D.                           Procedure Report  PROCEDURE:  Endoscopic retrograde cholangiopancreatography.  INDICATIONS:  This 70 year old white female underwent laparoscopic cholecystectomy yesterday for cholelithiasis. Intraoperative cholangiogram showed a retained common bile duct stone in the distal common bile duct, which appeared to be of normal diameter, now undergoing ERCP with removal of stone.  ENDOSCOPE:  Olympus single-channel side-viewing duodenoscope.  SEDATION:  Versed 8 mg IV, Demerol 30 mg IV, glucagon 1.5 mg IV.  FINDINGS:  Olympus single-channel side-viewing duodenoscope was advanced blindly through the posterior pharynx, into the esophagus, and into the stomach. The gastric mucosa and pylorus were unremarkable. The duodenal bulb and papilla were located and appeared normal. The papilla did not appear to be enlarged or edematous. It was cannulated with a sphincterotome and cholangiogram was obtained with fluoroscopy showing normal size common bile duct. The stone could not be seen, but after endoscopic sphincterotomy and sweep over the common bile duct with an 8.5 mm balloon there was a recovery of a 3 to 5 mm stone with a few fragments surrounding it and thick dark bile. Occlusion cholangiogram showed normal common bile duct, normal intrahepatic radicals, no retained stones after sphincterotomy. The main pancreatic duct was avoided.  The patient tolerated the procedure well.  IMPRESSION:  Choledocholithiasis status post endoscopic sphincterotomy and removal of common bile duct stone.  PLAN:  Observation today. Advance diet as per Dr. Johna Sheriff. DD:  05/09/00 TD:  05/09/00 Job: [id number]

## 2011-02-22 ENCOUNTER — Other Ambulatory Visit: Payer: Self-pay | Admitting: Internal Medicine

## 2011-04-03 ENCOUNTER — Ambulatory Visit (INDEPENDENT_AMBULATORY_CARE_PROVIDER_SITE_OTHER)
Admission: RE | Admit: 2011-04-03 | Discharge: 2011-04-03 | Disposition: A | Discharge status: Home / Self Care | Payer: BC Managed Care – PPO | Source: Ambulatory Visit | Attending: Endocrinology | Admitting: Endocrinology

## 2011-04-03 ENCOUNTER — Encounter: Payer: Self-pay | Admitting: Endocrinology

## 2011-04-03 ENCOUNTER — Ambulatory Visit (INDEPENDENT_AMBULATORY_CARE_PROVIDER_SITE_OTHER): Payer: BC Managed Care – PPO | Admitting: Endocrinology

## 2011-04-03 VITALS — BP 126/72 | HR 81 | Temp 99.2°F | Ht 61.0 in | Wt 158.0 lb

## 2011-04-03 DIAGNOSIS — R059 Cough, unspecified: Secondary | ICD-10-CM | POA: Insufficient documentation

## 2011-04-03 DIAGNOSIS — R05 Cough: Secondary | ICD-10-CM

## 2011-04-03 MED ORDER — DOXYCYCLINE HYCLATE 100 MG PO TABS: 100.0000 mg | ORAL_TABLET | Freq: Two times a day (BID) | ORAL | Status: AC

## 2011-04-03 MED ORDER — BENZONATATE 100 MG PO CAPS: 100.0000 mg | ORAL_CAPSULE | Freq: Three times a day (TID) | ORAL | Status: AC | PRN

## 2011-04-03 NOTE — Progress Notes (Signed)
  Subjective:    Patient ID: Whitney [name], female    DOB: [birth year], 70 y.o.   MRN: [id number]  HPI Pt states 1 week of mod prod-quality cough in the chest, and assoc pain.  She has some nasal congestion.   Past Medical History  Diagnosis Date  . Hypothyroidism   . Anemia     Iron def  . History of colon cancer 2008  . Uterine polyp   . Depression   . B12 deficiency     Past Surgical History  Procedure Date  . Cholecystectomy   . Colon surgery 2008    Resection    History   Social History  . Marital Status: Married    Spouse Name: N/A    Number of Children: N/A  . Years of Education: N/A   Occupational History  . Not on file.   Social History Main Topics  . Smoking status: Former Games developer  . Smokeless tobacco: Not on file  . Alcohol Use:   . Drug Use:   . Sexually Active:    Other Topics Concern  . Not on file   Social History Narrative  . No narrative on file    Current Outpatient Prescriptions on File Prior to Visit  Medication Sig Dispense Refill  . SYNTHROID 100 MCG tablet TAKE ONE TABLET BY MOUTH ONE TIME DAILY  30 each  4  . FLUoxetine (PROZAC) 10 MG capsule TAKE ONE CAPSULE BY MOUTH EVERY DAY  90 capsule  1    Allergies  Allergen Reactions  . Codeine Phosphate   . Penicillins     Family History  Problem Relation Age of Onset  . Heart disease Other     CAD and CHF    BP 126/72  Pulse 81  Temp(Src) 99.2 F (37.3 C) (Oral)  Ht 5\' 1"  (1.549 m)  Wt 158 lb (71.668 kg)  BMI 29.85 kg/m2  SpO2 96%  Review of Systems She has wheezing, but no fever.      Objective:   Physical Exam VITAL SIGNS:  See vs page GENERAL: no distress head: no deformity eyes: no periorbital swelling, no proptosis external nose and ears are normal mouth: no lesion seen Both eac's and tm's are normal Neck: supple.  There is no palpable thyroid enlargement.  No thyroid nodule is palpable.  No palpable lymphadenopathy at the anterior neck. LUNGS:  Clear to  auscultation HEART: Regular rate and rhythm without murmurs noted. Normal S1,S2.     Cxr: early infiltrate    Assessment & Plan:  Glenford Peers, new Early pneumonia, new.  There is risk of progression Wheezing, new

## 2011-04-03 NOTE — Patient Instructions (Addendum)
Let's check a chest-x-ray.  please call [phone] to hear your test results.  You will be prompted to enter the 9-digit "MRN" number that appears at the top left of this page, followed by #.  Then you will hear the message. here is a sample of "symbicort-80."  take 1 puff 2x a day.  rinse mouth after using. i have sent a prescription to your pharmacy, for and antibiotic,and non-drowsy cough pills. I hope you feel better soon.  If you don't feel better by next week, please call doctor plotnikov. (update: i left message on phone-tree:  rx as we discussed.  If you get better in the next few days, see dr plotnikov in 1 month to recheck cxr.  If not, please call him in a few days).

## 2011-04-16 ENCOUNTER — Encounter: Payer: Self-pay | Admitting: Internal Medicine

## 2011-04-16 ENCOUNTER — Ambulatory Visit (INDEPENDENT_AMBULATORY_CARE_PROVIDER_SITE_OTHER): Payer: BC Managed Care – PPO | Admitting: Internal Medicine

## 2011-04-16 ENCOUNTER — Ambulatory Visit (INDEPENDENT_AMBULATORY_CARE_PROVIDER_SITE_OTHER)
Admission: RE | Admit: 2011-04-16 | Discharge: 2011-04-16 | Disposition: A | Discharge status: Home / Self Care | Payer: BC Managed Care – PPO | Source: Ambulatory Visit | Attending: Internal Medicine | Admitting: Internal Medicine

## 2011-04-16 VITALS — BP 118/70 | HR 80 | Temp 99.3°F | Resp 16 | Wt 157.0 lb

## 2011-04-16 DIAGNOSIS — E039 Hypothyroidism, unspecified: Secondary | ICD-10-CM

## 2011-04-16 DIAGNOSIS — R05 Cough: Secondary | ICD-10-CM

## 2011-04-16 DIAGNOSIS — E538 Deficiency of other specified B group vitamins: Secondary | ICD-10-CM

## 2011-04-16 DIAGNOSIS — Z0000 Encounter for general adult medical examination without abnormal findings: Secondary | ICD-10-CM

## 2011-04-16 DIAGNOSIS — J40 Bronchitis, not specified as acute or chronic: Secondary | ICD-10-CM | POA: Insufficient documentation

## 2011-04-16 MED ORDER — CHLORPHENIRAMINE-HYDROCODONE 8-10 MG/5ML PO LQCR: 5.0000 mL | Freq: Two times a day (BID) | ORAL | Status: DC | PRN

## 2011-04-16 MED ORDER — VITAMIN D 1000 UNITS PO TABS: 1000.0000 [IU] | ORAL_TABLET | Freq: Every day | ORAL | Status: DC

## 2011-04-16 MED ORDER — VITAMIN B-12 1000 MCG SL SUBL: 1.0000 | SUBLINGUAL_TABLET | Freq: Every day | SUBLINGUAL | Status: AC

## 2011-04-16 MED ORDER — MOXIFLOXACIN HCL 400 MG PO TABS: 400.0000 mg | ORAL_TABLET | Freq: Every day | ORAL | Status: AC

## 2011-04-16 MED ORDER — CYANOCOBALAMIN 1000 MCG/ML IJ SOLN
1000.0000 ug | Freq: Once | INTRAMUSCULAR | Status: AC
  Administered 2011-04-16: 1000 ug via INTRAMUSCULAR

## 2011-04-16 MED ORDER — BUDESONIDE-FORMOTEROL FUMARATE 80-4.5 MCG/ACT IN AERO: 1.0000 | INHALATION_SPRAY | Freq: Two times a day (BID) | RESPIRATORY_TRACT | Status: DC

## 2011-04-16 NOTE — Assessment & Plan Note (Signed)
Risks associated with treatment noncompliance were discussed. Compliance was encouraged. 

## 2011-04-16 NOTE — Assessment & Plan Note (Signed)
R/o pneumonia Avelox x 10 d CXR

## 2011-04-16 NOTE — Assessment & Plan Note (Signed)
Continue with current prescription therapy as reflected on the Med list.  

## 2011-04-16 NOTE — Progress Notes (Signed)
  Subjective:    Patient ID: Whitney [name], female    DOB: 01-08-1941, 70 y.o.   MRN: [id number]  HPI   HPI  C/o URI sx's x  14-17 days. C/o ST, cough, weakness. Not better with OTC medicines. Actually, the patient is getting worse. The patient did not sleep last night due to cough.  Review of Systems  Constitutional: Positive for fever, chills and fatigue.  HENT: Positive for congestion, rhinorrhea, sneezing and postnasal drip.   Eyes: Positive for photophobia and pain. Negative for discharge and visual disturbance.  Respiratory: Positive for cough and wheezing.   Positive for chest pain.  Gastrointestinal: Negative for vomiting, abdominal pain, diarrhea and abdominal distention.  Genitourinary: Negative for dysuria and difficulty urinating.  Skin: Negative for rash.  Neurological: Positive for dizziness, weakness and light-headedness.      Review of Systems     Objective:   Physical Exam  Constitutional: She appears well-developed and well-nourished. No distress.  HENT:  Head: Normocephalic.  Right Ear: External ear normal.  Left Ear: External ear normal.  Nose: Nose normal.  Mouth/Throat: Oropharynx is clear and moist.  Eyes: Conjunctivae are normal. Pupils are equal, round, and reactive to light. Right eye exhibits no discharge. Left eye exhibits no discharge.  Neck: Normal range of motion. Neck supple. No JVD present. No tracheal deviation present. No thyromegaly present.  Cardiovascular: Normal rate, regular rhythm and normal heart sounds.   Pulmonary/Chest: No stridor. No respiratory distress. She has wheezes. She has rales (L base).  Abdominal: Soft. Bowel sounds are normal. She exhibits no distension and no mass. There is no tenderness. There is no rebound and no guarding.  Musculoskeletal: She exhibits no edema and no tenderness.  Lymphadenopathy:    She has no cervical adenopathy.  Neurological: She displays normal reflexes. No cranial nerve deficit. She  exhibits normal muscle tone. Coordination normal.  Skin: No rash noted. No erythema.  Psychiatric: She has a normal mood and affect. Her behavior is normal. Judgment and thought content normal.          Assessment & Plan:

## 2011-04-16 NOTE — Patient Instructions (Signed)
Use over-the-counter  "cold" medicines  such as "Tylenol cold" , "Advil cold",  "Mucinex" or" Mucinex D"  for cough and congestion.   Avoid decongestants if you have high blood pressure and use "Afrin" nasal spray for nasal congestion as directed instead. Use" Delsym" or" Robitussin" cough syrup varietis for cough.  You can use plain "Tylenol" or "Advil" for fever, chills and achyness.  Please, make an appointment if you are not better or if you're worse.  

## 2011-04-16 NOTE — Assessment & Plan Note (Signed)
Symbicort Tussionex

## 2011-04-17 ENCOUNTER — Telehealth: Payer: Self-pay | Admitting: Internal Medicine

## 2011-04-17 NOTE — Telephone Encounter (Signed)
Called patient and notified per MD 

## 2011-04-17 NOTE — Telephone Encounter (Signed)
Misty Stanley, please, inform patient that her CXR was OK Thx

## 2011-05-02 LAB — CBC
HCT: 29.7 — ABNORMAL LOW
Hemoglobin: 7.7 — CL
Hemoglobin: 9.9 — ABNORMAL LOW
MCHC: 32.5
MCV: 84.3
Platelets: 291
RBC: 2.83 — ABNORMAL LOW
WBC: 11.1 — ABNORMAL HIGH

## 2011-05-02 LAB — BASIC METABOLIC PANEL
CO2: 27
Calcium: 7.8 — ABNORMAL LOW
Chloride: 102
GFR calc Af Amer: >60
Sodium: 132 — ABNORMAL LOW

## 2011-05-03 LAB — DIFFERENTIAL
Basophils Absolute: 0
Eosinophils Absolute: 0.3
Eosinophils Relative: 4

## 2011-05-03 LAB — CROSSMATCH: ABO/RH(D): O POS

## 2011-05-03 LAB — COMPREHENSIVE METABOLIC PANEL
ALT: 23
AST: 22
Alkaline Phosphatase: 86
CO2: 27
Chloride: 104
GFR calc non Af Amer: >60
Potassium: 3.8
Total Bilirubin: 0.2 — ABNORMAL LOW

## 2011-05-03 LAB — CBC
HCT: 29.1 — ABNORMAL LOW
Hemoglobin: 9.5 — ABNORMAL LOW
MCHC: 32.6
Platelets: 427 — ABNORMAL HIGH
RDW: 17.3 — ABNORMAL HIGH

## 2011-05-08 LAB — DIFFERENTIAL
Basophils Absolute: 0.1
Eosinophils Absolute: 0.3
Lymphs Abs: 1.8
Monocytes Absolute: 0.9 — ABNORMAL HIGH
Monocytes Relative: 8

## 2011-05-08 LAB — CBC
HCT: 20.8 — ABNORMAL LOW
HCT: 35 — ABNORMAL LOW
Hemoglobin: 11.3 — ABNORMAL LOW
Hemoglobin: 11.5 — ABNORMAL LOW
MCHC: 31.2
MCHC: 32.6
Platelets: 448 — ABNORMAL HIGH
Platelets: 513 — ABNORMAL HIGH
RDW: 17.2 — ABNORMAL HIGH
RDW: 25.8 — ABNORMAL HIGH
WBC: 8

## 2011-05-08 LAB — BASIC METABOLIC PANEL
BUN: 15
BUN: 17
Calcium: 8.8
GFR calc non Af Amer: 58 — ABNORMAL LOW
GFR calc non Af Amer: >60
Glucose, Bld: 124 — ABNORMAL HIGH
Potassium: 3.8
Potassium: 4
Sodium: 139

## 2011-05-08 LAB — TISSUE TRANSGLUTAMINASE, IGA: Tissue Transglutaminase Ab, IgA: 1.2 U/mL (ref <7)

## 2011-05-08 LAB — ABO/RH: ABO/RH(D): O POS

## 2011-05-08 LAB — CROSSMATCH

## 2011-05-08 LAB — VITAMIN B12: Vitamin B-12: 1123 — ABNORMAL HIGH (ref 211–911)

## 2011-05-08 LAB — PREPARE RBC (CROSSMATCH)

## 2011-05-08 LAB — IRON AND TIBC
Saturation Ratios: 3 — ABNORMAL LOW
Saturation Ratios: 57 — ABNORMAL HIGH
TIBC: 311
UIBC: 135

## 2011-05-18 ENCOUNTER — Other Ambulatory Visit: Payer: Self-pay | Admitting: Internal Medicine

## 2011-07-03 ENCOUNTER — Other Ambulatory Visit (INDEPENDENT_AMBULATORY_CARE_PROVIDER_SITE_OTHER): Payer: BC Managed Care – PPO

## 2011-07-03 ENCOUNTER — Other Ambulatory Visit: Payer: Self-pay | Admitting: Internal Medicine

## 2011-07-03 DIAGNOSIS — Z0000 Encounter for general adult medical examination without abnormal findings: Secondary | ICD-10-CM

## 2011-07-03 DIAGNOSIS — E538 Deficiency of other specified B group vitamins: Secondary | ICD-10-CM

## 2011-07-03 LAB — URINALYSIS, ROUTINE W REFLEX MICROSCOPIC
Hgb urine dipstick: NEGATIVE
Nitrite: NEGATIVE
Specific Gravity, Urine: 1.025 (ref 1.000–1.030)
Total Protein, Urine: NEGATIVE
Urine Glucose: NEGATIVE
Urobilinogen, UA: 0.2 (ref 0.0–1.0)

## 2011-07-03 LAB — COMPREHENSIVE METABOLIC PANEL
Albumin: 4 g/dL (ref 3.5–5.2)
Alkaline Phosphatase: 92 U/L (ref 39–117)
BUN: 21 mg/dL (ref 6–23)
CO2: 27 mEq/L (ref 19–32)
Calcium: 9.3 mg/dL (ref 8.4–10.5)
Chloride: 105 mEq/L (ref 96–112)
GFR: 82.72 mL/min (ref >60.00)
Glucose, Bld: 104 mg/dL — ABNORMAL HIGH (ref 70–99)
Potassium: 4.3 mEq/L (ref 3.5–5.1)
Sodium: 139 mEq/L (ref 135–145)
Total Protein: 7.7 g/dL (ref 6.0–8.3)

## 2011-07-03 LAB — CBC WITH DIFFERENTIAL/PLATELET
Basophils Relative: 0.4 % (ref 0.0–3.0)
Eosinophils Relative: 2.8 % (ref 0.0–5.0)
Lymphocytes Relative: 24.4 % (ref 12.0–46.0)
MCV: 86.4 fl (ref 78.0–100.0)
Monocytes Absolute: 0.6 10*3/uL (ref 0.1–1.0)
Monocytes Relative: 7.2 % (ref 3.0–12.0)
Neutrophils Relative %: 65.2 % (ref 43.0–77.0)
Platelets: 318 10*3/uL (ref 150.0–400.0)
RBC: 4.24 Mil/uL (ref 3.87–5.11)
WBC: 7.9 10*3/uL (ref 4.5–10.5)

## 2011-07-03 LAB — LIPID PANEL: HDL: 38.1 mg/dL — ABNORMAL LOW (ref >39.00)

## 2011-07-03 LAB — TSH: TSH: 2.35 u[IU]/mL (ref 0.35–5.50)

## 2011-07-03 LAB — VITAMIN B12: Vitamin B-12: 590 pg/mL (ref 211–911)

## 2011-07-04 LAB — LDL CHOLESTEROL, DIRECT: Direct LDL: 121 mg/dL

## 2011-07-09 ENCOUNTER — Ambulatory Visit (INDEPENDENT_AMBULATORY_CARE_PROVIDER_SITE_OTHER): Payer: BC Managed Care – PPO | Admitting: Internal Medicine

## 2011-07-09 ENCOUNTER — Encounter: Payer: Self-pay | Admitting: Internal Medicine

## 2011-07-09 DIAGNOSIS — Z23 Encounter for immunization: Secondary | ICD-10-CM

## 2011-07-09 DIAGNOSIS — E538 Deficiency of other specified B group vitamins: Secondary | ICD-10-CM

## 2011-07-09 DIAGNOSIS — R21 Rash and other nonspecific skin eruption: Secondary | ICD-10-CM

## 2011-07-09 DIAGNOSIS — F329 Major depressive disorder, single episode, unspecified: Secondary | ICD-10-CM

## 2011-07-09 DIAGNOSIS — E039 Hypothyroidism, unspecified: Secondary | ICD-10-CM

## 2011-07-09 DIAGNOSIS — F3289 Other specified depressive episodes: Secondary | ICD-10-CM

## 2011-07-09 DIAGNOSIS — D485 Neoplasm of uncertain behavior of skin: Secondary | ICD-10-CM

## 2011-07-09 DIAGNOSIS — Z0000 Encounter for general adult medical examination without abnormal findings: Secondary | ICD-10-CM | POA: Insufficient documentation

## 2011-07-09 MED ORDER — TRIAMCINOLONE ACETONIDE 0.5 % EX CREA: TOPICAL_CREAM | Freq: Three times a day (TID) | CUTANEOUS | Status: AC

## 2011-07-09 NOTE — Assessment & Plan Note (Signed)
Continue with current prescription therapy as reflected on the Med list.  

## 2011-07-09 NOTE — Progress Notes (Signed)
  Subjective:    Patient ID: Whitney [name], female    DOB: Dec 06, 1940, 70 y.o.   MRN: [id number]  HPI The patient is here for a wellness exam. The patient has been doing well overall without major physical or psychological issues going on lately. The patient is here to follow up on chronic depression, anxiety, headaches and hypothyroidism controlled with medicines, diet and exercise. C/o rash behind R ear x 2 mo   Review of Systems  Constitutional: Positive for fatigue. Negative for chills, activity change, appetite change and unexpected weight change.  HENT: Negative for congestion, mouth sores and sinus pressure.   Eyes: Negative for visual disturbance.  Respiratory: Negative for cough and chest tightness.   Gastrointestinal: Negative for nausea and abdominal pain.  Genitourinary: Negative for frequency, difficulty urinating and vaginal pain.  Musculoskeletal: Negative for back pain and gait problem.  Skin: Positive for rash. Negative for pallor.  Neurological: Negative for dizziness, tremors, weakness, numbness and headaches.  Psychiatric/Behavioral: Negative for confusion and sleep disturbance.       Objective:   Physical Exam  Constitutional: She appears well-developed. No distress.       Obese   HENT:  Head: Normocephalic.  Right Ear: External ear normal.  Left Ear: External ear normal.  Nose: Nose normal.  Mouth/Throat: Oropharynx is clear and moist.  Eyes: Conjunctivae are normal. Pupils are equal, round, and reactive to light. Right eye exhibits no discharge. Left eye exhibits no discharge.  Neck: Normal range of motion. Neck supple. No JVD present. No tracheal deviation present. No thyromegaly present.  Cardiovascular: Normal rate, regular rhythm and normal heart sounds.   Pulmonary/Chest: No stridor. No respiratory distress. She has no wheezes.  Abdominal: Soft. Bowel sounds are normal. She exhibits no distension and no mass. There is no tenderness. There is no  rebound and no guarding.  Musculoskeletal: She exhibits no edema and no tenderness.  Lymphadenopathy:    She has no cervical adenopathy.  Neurological: She displays normal reflexes. No cranial nerve deficit. She exhibits normal muscle tone. Coordination normal.  Skin: Rash (behind R ear) noted. No erythema.  Psychiatric: She has a normal mood and affect. Her behavior is normal. Judgment and thought content normal.   R poster arm lesion  Lab Results  Component Value Date   WBC 7.9 07/03/2011   HGB 12.5 07/03/2011   HCT 36.6 07/03/2011   PLT 318.0 07/03/2011   GLUCOSE 104* 07/03/2011   CHOL 210* 07/03/2011   TRIG 240.0* 07/03/2011   HDL 38.10* 07/03/2011   LDLDIRECT 121.0 07/03/2011   LDLCALC 114* 01/27/2007   ALT 18 07/03/2011   AST 19 07/03/2011   NA 139 07/03/2011   K 4.3 07/03/2011   CL 105 07/03/2011   CREATININE 0.7 07/03/2011   BUN 21 07/03/2011   CO2 27 07/03/2011   TSH 2.35 07/03/2011   HGBA1C 6.1 02/03/2010        Assessment & Plan:

## 2011-07-09 NOTE — Assessment & Plan Note (Signed)
Skin bx 

## 2011-07-09 NOTE — Assessment & Plan Note (Signed)
We discussed age appropriate health related issues, including available/recomended screening tests and vaccinations. We discussed a need for adhering to healthy diet and exercise. Labs/EKG were reviewed/ordered. All questions were answered.  She'll see Dr Rana Snare for a GYN exam Eye exam 2012 Colon 2012

## 2011-10-22 ENCOUNTER — Other Ambulatory Visit: Payer: Self-pay | Admitting: Internal Medicine

## 2011-10-22 DIAGNOSIS — Z1231 Encounter for screening mammogram for malignant neoplasm of breast: Secondary | ICD-10-CM

## 2011-11-30 ENCOUNTER — Ambulatory Visit
Admission: RE | Admit: 2011-11-30 | Discharge: 2011-11-30 | Disposition: A | Discharge status: Home / Self Care | Payer: BC Managed Care – PPO | Source: Ambulatory Visit | Attending: Internal Medicine | Admitting: Internal Medicine

## 2011-11-30 DIAGNOSIS — Z1231 Encounter for screening mammogram for malignant neoplasm of breast: Secondary | ICD-10-CM

## 2011-12-31 ENCOUNTER — Other Ambulatory Visit: Payer: Self-pay | Admitting: Internal Medicine

## 2011-12-31 NOTE — Telephone Encounter (Signed)
Pt is also requesting Rx for Zostavax to take to her local [facility].

## 2012-01-08 ENCOUNTER — Ambulatory Visit: Payer: BC Managed Care – PPO | Admitting: Internal Medicine

## 2012-01-14 ENCOUNTER — Encounter: Payer: Self-pay | Admitting: Internal Medicine

## 2012-01-14 ENCOUNTER — Ambulatory Visit (INDEPENDENT_AMBULATORY_CARE_PROVIDER_SITE_OTHER): Payer: BC Managed Care – PPO | Admitting: Internal Medicine

## 2012-01-14 VITALS — BP 148/90 | HR 76 | Temp 97.6°F | Resp 16 | Wt 155.0 lb

## 2012-01-14 DIAGNOSIS — E039 Hypothyroidism, unspecified: Secondary | ICD-10-CM

## 2012-01-14 DIAGNOSIS — E538 Deficiency of other specified B group vitamins: Secondary | ICD-10-CM

## 2012-01-14 DIAGNOSIS — F329 Major depressive disorder, single episode, unspecified: Secondary | ICD-10-CM

## 2012-01-14 DIAGNOSIS — Z8249 Family history of ischemic heart disease and other diseases of the circulatory system: Secondary | ICD-10-CM

## 2012-01-14 DIAGNOSIS — F3289 Other specified depressive episodes: Secondary | ICD-10-CM

## 2012-01-14 DIAGNOSIS — Z85038 Personal history of other malignant neoplasm of large intestine: Secondary | ICD-10-CM

## 2012-01-14 MED ORDER — ASPIRIN 81 MG PO CHEW: 81.0000 mg | CHEWABLE_TABLET | Freq: Every day | ORAL | Status: AC

## 2012-01-14 MED ORDER — ZOSTER VACCINE LIVE 19400 UNT/0.65ML ~~LOC~~ SOLR: 0.6500 mL | Freq: Once | SUBCUTANEOUS | Status: AC

## 2012-01-14 MED ORDER — SYNTHROID 100 MCG PO TABS: 100.0000 ug | ORAL_TABLET | Freq: Every day | ORAL | Status: DC

## 2012-01-14 NOTE — Assessment & Plan Note (Signed)
Continue with current prescription therapy as reflected on the Med list.  

## 2012-01-14 NOTE — Progress Notes (Signed)
Patient ID: Whitney [name], female   DOB: 11-17-1940, 71 y.o.   MRN: [id number]  Subjective:    Patient ID: HIYA POINT, female    DOB: 02-05-1941, 71 y.o.   MRN: [id number]  HPI  The patient is here to follow up on chronic depression, anxiety, B12 def and hypothyroidism controlled with medicines, diet and exercise.   Review of Systems  Constitutional: Positive for fatigue. Negative for chills, activity change, appetite change and unexpected weight change.  HENT: Negative for congestion, mouth sores and sinus pressure.   Eyes: Negative for visual disturbance.  Respiratory: Negative for cough and chest tightness.   Gastrointestinal: Negative for nausea and abdominal pain.  Genitourinary: Negative for frequency, difficulty urinating and vaginal pain.  Musculoskeletal: Negative for back pain and gait problem.  Skin: Negative for pallor and rash.  Neurological: Negative for dizziness, tremors, weakness, numbness and headaches.  Psychiatric/Behavioral: Negative for confusion and disturbed wake/sleep cycle.       Objective:   Physical Exam  Constitutional: She appears well-developed. No distress.       Obese   HENT:  Head: Normocephalic.  Right Ear: External ear normal.  Left Ear: External ear normal.  Nose: Nose normal.  Mouth/Throat: Oropharynx is clear and moist.  Eyes: Conjunctivae are normal. Pupils are equal, round, and reactive to light. Right eye exhibits no discharge. Left eye exhibits no discharge.  Neck: Normal range of motion. Neck supple. No JVD present. No tracheal deviation present. No thyromegaly present.  Cardiovascular: Normal rate, regular rhythm and normal heart sounds.   Pulmonary/Chest: No stridor. No respiratory distress. She has no wheezes.  Abdominal: Soft. Bowel sounds are normal. She exhibits no distension and no mass. There is no tenderness. There is no rebound and no guarding.  Musculoskeletal: She exhibits no edema and no tenderness.    Lymphadenopathy:    She has no cervical adenopathy.  Neurological: She displays normal reflexes. No cranial nerve deficit. She exhibits normal muscle tone. Coordination normal.  Skin: No rash (behind R ear) noted. No erythema.  Psychiatric: She has a normal mood and affect. Her behavior is normal. Judgment and thought content normal.   R poster arm lesion  Lab Results  Component Value Date   WBC 7.9 07/03/2011   HGB 12.5 07/03/2011   HCT 36.6 07/03/2011   PLT 318.0 07/03/2011   GLUCOSE 104* 07/03/2011   CHOL 210* 07/03/2011   TRIG 240.0* 07/03/2011   HDL 38.10* 07/03/2011   LDLDIRECT 121.0 07/03/2011   LDLCALC 114* 01/27/2007   ALT 18 07/03/2011   AST 19 07/03/2011   NA 139 07/03/2011   K 4.3 07/03/2011   CL 105 07/03/2011   CREATININE 0.7 07/03/2011   BUN 21 07/03/2011   CO2 27 07/03/2011   TSH 2.35 07/03/2011   HGBA1C 6.1 02/03/2010        Assessment & Plan:

## 2012-01-14 NOTE — Assessment & Plan Note (Signed)
F/ou w/Oncology

## 2012-01-14 NOTE — Assessment & Plan Note (Addendum)
F died at 56 w/MI Start baby asa qd Declined a stress test

## 2012-07-29 ENCOUNTER — Ambulatory Visit: Payer: BC Managed Care – PPO | Admitting: Internal Medicine

## 2013-02-09 ENCOUNTER — Other Ambulatory Visit: Payer: Self-pay | Admitting: Internal Medicine

## 2013-02-11 ENCOUNTER — Other Ambulatory Visit: Payer: Self-pay

## 2013-02-11 DIAGNOSIS — Z1231 Encounter for screening mammogram for malignant neoplasm of breast: Secondary | ICD-10-CM

## 2013-02-24 ENCOUNTER — Ambulatory Visit
Admission: RE | Admit: 2013-02-24 | Discharge: 2013-02-24 | Disposition: A | Discharge status: Home / Self Care | Payer: BC Managed Care – PPO | Source: Ambulatory Visit

## 2013-02-24 DIAGNOSIS — Z1231 Encounter for screening mammogram for malignant neoplasm of breast: Secondary | ICD-10-CM

## 2013-05-27 ENCOUNTER — Other Ambulatory Visit: Payer: Self-pay | Admitting: Internal Medicine

## 2013-06-19 ENCOUNTER — Ambulatory Visit: Payer: BC Managed Care – PPO | Admitting: Family Medicine

## 2013-06-19 VITALS — BP 134/80 | HR 72 | Temp 98.8°F | Resp 16 | Ht 62.5 in | Wt 163.8 lb

## 2013-06-19 DIAGNOSIS — R05 Cough: Secondary | ICD-10-CM

## 2013-06-19 DIAGNOSIS — J019 Acute sinusitis, unspecified: Secondary | ICD-10-CM

## 2013-06-19 DIAGNOSIS — J0100 Acute maxillary sinusitis, unspecified: Secondary | ICD-10-CM

## 2013-06-19 DIAGNOSIS — R5381 Other malaise: Secondary | ICD-10-CM

## 2013-06-19 LAB — POCT INFLUENZA A/B: Influenza B, POC: NEGATIVE

## 2013-06-19 MED ORDER — DOXYCYCLINE HYCLATE 100 MG PO TABS: 100.0000 mg | ORAL_TABLET | Freq: Two times a day (BID) | ORAL | Status: DC

## 2013-06-19 MED ORDER — BENZONATATE 100 MG PO CAPS: 100.0000 mg | ORAL_CAPSULE | Freq: Three times a day (TID) | ORAL | Status: DC | PRN

## 2013-06-19 NOTE — Progress Notes (Signed)
[facility] [address], [place] [address] [phone]  Date:  06/19/2013   Name:  VERGENE MARLAND   DOB:  [birth year]   MRN:  [id number]  PCP:  Sonda Primes, MD    Chief Complaint: Cough, Sore Throat, Fever and Nasal Congestion   History of Present Illness:  KRYSTYNE TEWKSBURY is a 72 y.o. very pleasant female patient who presents with the following:  She is here today with illness.  She has been ill for 3 or 4 days.  She has a ST, drippy nose, cough.  She notes sinus congestion and pain.   She was feverish today but did not check her temperature.  She feels that she is getting worse.   She has noted some aches, no chills   Her cough can be productive.   No GI symptoms.   She is a former smoker Her husband has been ill recently.    Patient Active Problem List   Diagnosis Date Noted  . Family history of early CAD 01/14/2012  . Rash 07/09/2011  . Well adult exam 07/09/2011  . Neoplasm of uncertain behavior of skin 07/09/2011  . Bronchitis 04/16/2011  . TOBACCO USE, QUIT 10/07/2009  . VITAMIN B12 DEFICIENCY 04/04/2009  . DEPRESSION 04/04/2009  . PRURITUS 03/02/2009  . DIARRHEA 03/02/2009  . COLON CANCER, HX OF 05/26/2008  . HYPOTHYROIDISM 04/19/2007  . ANEMIA-IRON DEFICIENCY 04/19/2007    Past Medical History  Diagnosis Date  . Hypothyroidism   . Anemia     Iron def  . History of colon cancer 2008  . Uterine polyp   . Depression   . B12 deficiency     Past Surgical History  Procedure Laterality Date  . Cholecystectomy    . Colon surgery  2008    Resection    History  Substance Use Topics  . Smoking status: Former Games developer  . Smokeless tobacco: Not on file  . Alcohol Use: No    Family History  Problem Relation Age of Onset  . Heart disease Other     CAD and CHF  . Hypertension Mother   . Heart disease Mother 6    CHF  . Heart disease Father 33    MI died    Allergies  Allergen Reactions  . Codeine Phosphate   .  Penicillins     Medication list has been reviewed and updated.  Current Outpatient Prescriptions on File Prior to Visit  Medication Sig Dispense Refill  . acetaminophen (TYLENOL) 500 MG tablet Take 500 mg by mouth every 6 (six) hours as needed.        . cholecalciferol (VITAMIN D) 1000 UNITS tablet Take 1 tablet (1,000 Units total) by mouth daily.  100 tablet  3  . Cyanocobalamin (VITAMIN B-12) 1000 MCG SUBL Place 1 tablet (1,000 mcg total) under the tongue daily.  100 tablet  3  . SYNTHROID 100 MCG tablet TAKE ONE TABLET BY MOUTH ONCE DAILY  30 tablet  0  . budesonide-formoterol (SYMBICORT) 80-4.5 MCG/ACT inhaler Inhale 1 puff into the lungs 2 (two) times daily.  1 Inhaler  3  . FLUoxetine (PROZAC) 10 MG capsule TAKE ONE CAPSULE BY MOUTH EVERY DAY  90 capsule  1  . LORazepam (ATIVAN) 0.5 MG tablet Take 0.5 mg by mouth 2 (two) times daily as needed.         No current facility-administered medications on file prior to visit.    Review of Systems:  As  per HPI- otherwise negative.   Physical Examination: Filed Vitals:   06/19/13 1802  BP: 134/80  Pulse: 72  Temp: 98.8 F (37.1 C)  Resp: 16   Filed Vitals:   06/19/13 1802  Height: 5' 2.5" (1.588 m)  Weight: 163 lb 12.8 oz (74.299 kg)   Body mass index is 29.46 kg/(m^2). Ideal Body Weight: Weight in (lb) to have BMI = 25: 138.6  GEN: WDWN, NAD, Non-toxic, A & O x 3, overweight, looks well HEENT: Atraumatic, Normocephalic. Neck supple. No masses, No LAD.  Bilateral TM wnl, oropharynx normal.  PEERL,EOMI.   Nasal cavity inflamed Ears and Nose: No external deformity. CV: RRR, No M/G/R. No JVD. No thrill. No extra heart sounds. PULM: CTA B, no wheezes, crackles, rhonchi. No retractions. No resp. distress. No accessory muscle use. EXTR: No c/c/e NEURO Normal gait.  PSYCH: Normally interactive. Conversant. Not depressed or anxious appearing.  Calm demeanor.   Results for orders placed in visit on 06/19/13  POCT INFLUENZA A/B       Result Value Range   Influenza A, POC Negative     Influenza B, POC Negative      Assessment and Plan: Other malaise and fatigue - Plan: POCT Influenza A/B  Sinusitis, acute - Plan: doxycycline (VIBRA-TABS) 100 MG tablet  Cough - Plan: benzonatate (TESSALON) 100 MG capsule  Sinus congestion and discomfort.  Treat with doxycycline and tessalon.  Let us know if not better in the next couple of days- Sooner if worse.     Signed Abbe Amsterdam, MD

## 2013-06-19 NOTE — Patient Instructions (Signed)
Use the doxycycline for a sinus infection and the tessalon perles as needed for cough.  Let me know if you are not better in the next few days- Sooner if worse.

## 2013-07-07 ENCOUNTER — Other Ambulatory Visit: Payer: Self-pay | Admitting: *Deleted

## 2013-07-07 MED ORDER — SYNTHROID 100 MCG PO TABS: 100.0000 ug | ORAL_TABLET | Freq: Every day | ORAL | Status: DC

## 2013-07-27 ENCOUNTER — Other Ambulatory Visit: Payer: Self-pay | Admitting: *Deleted

## 2013-07-27 MED ORDER — SYNTHROID 100 MCG PO TABS
100.0000 ug | ORAL_TABLET | Freq: Every day | ORAL | Status: DC
Start: 2013-07-27 — End: 2013-08-03

## 2013-08-03 ENCOUNTER — Encounter: Payer: Self-pay | Admitting: Internal Medicine

## 2013-08-03 ENCOUNTER — Ambulatory Visit (INDEPENDENT_AMBULATORY_CARE_PROVIDER_SITE_OTHER): Payer: BC Managed Care – PPO | Admitting: Internal Medicine

## 2013-08-03 VITALS — BP 138/82 | HR 80 | Temp 97.0°F | Resp 16 | Wt 161.0 lb

## 2013-08-03 DIAGNOSIS — Z85038 Personal history of other malignant neoplasm of large intestine: Secondary | ICD-10-CM

## 2013-08-03 DIAGNOSIS — F3289 Other specified depressive episodes: Secondary | ICD-10-CM

## 2013-08-03 DIAGNOSIS — D509 Iron deficiency anemia, unspecified: Secondary | ICD-10-CM

## 2013-08-03 DIAGNOSIS — E039 Hypothyroidism, unspecified: Secondary | ICD-10-CM

## 2013-08-03 DIAGNOSIS — E538 Deficiency of other specified B group vitamins: Secondary | ICD-10-CM

## 2013-08-03 DIAGNOSIS — F329 Major depressive disorder, single episode, unspecified: Secondary | ICD-10-CM

## 2013-08-03 MED ORDER — SYNTHROID 100 MCG PO TABS
100.0000 ug | ORAL_TABLET | Freq: Every day | ORAL | Status: DC
Start: 2013-08-03 — End: 2013-10-01

## 2013-08-03 NOTE — Assessment & Plan Note (Signed)
CBC

## 2013-08-03 NOTE — Assessment & Plan Note (Signed)
CEA

## 2013-08-03 NOTE — Assessment & Plan Note (Signed)
Continue with current prescription therapy as reflected on the Med list.  

## 2013-08-03 NOTE — Progress Notes (Signed)
Pre visit review using our clinic review tool, if applicable. No additional management support is needed unless otherwise documented below in the visit note. 

## 2013-08-03 NOTE — Assessment & Plan Note (Signed)
Labs  Continue with current prescription therapy as reflected on the Med list.  

## 2013-08-03 NOTE — Progress Notes (Signed)
   Subjective:     HPI  The patient is here to follow up on colon Ca, chronic depression, anxiety, B12 def and hypothyroidism controlled with medicines, diet and exercise.  Wt Readings from Last 3 Encounters:  08/03/13 161 lb (73.029 kg)  06/19/13 163 lb 12.8 oz (74.299 kg)  01/14/12 155 lb (70.308 kg)   BP Readings from Last 3 Encounters:  08/03/13 138/82  06/19/13 134/80  01/14/12 148/90       Review of Systems  Constitutional: Positive for fatigue. Negative for chills, activity change, appetite change and unexpected weight change.  HENT: Negative for congestion, mouth sores and sinus pressure.   Eyes: Negative for visual disturbance.  Respiratory: Negative for cough and chest tightness.   Gastrointestinal: Negative for nausea and abdominal pain.  Genitourinary: Negative for frequency, difficulty urinating and vaginal pain.  Musculoskeletal: Negative for back pain and gait problem.  Skin: Negative for pallor and rash.  Neurological: Negative for dizziness, tremors, weakness, numbness and headaches.  Psychiatric/Behavioral: Negative for confusion and sleep disturbance.       Objective:   Physical Exam  Constitutional: She appears well-developed. No distress.  Obese   HENT:  Head: Normocephalic.  Right Ear: External ear normal.  Left Ear: External ear normal.  Nose: Nose normal.  Mouth/Throat: Oropharynx is clear and moist.  Eyes: Conjunctivae are normal. Pupils are equal, round, and reactive to light. Right eye exhibits no discharge. Left eye exhibits no discharge.  Neck: Normal range of motion. Neck supple. No JVD present. No tracheal deviation present. No thyromegaly present.  Cardiovascular: Normal rate, regular rhythm and normal heart sounds.   Pulmonary/Chest: No stridor. No respiratory distress. She has no wheezes.  Abdominal: Soft. Bowel sounds are normal. She exhibits no distension and no mass. There is no tenderness. There is no rebound and no guarding.   Musculoskeletal: She exhibits no edema and no tenderness.  Lymphadenopathy:    She has no cervical adenopathy.  Neurological: She displays normal reflexes. No cranial nerve deficit. She exhibits normal muscle tone. Coordination normal.  Skin: No rash (behind R ear) noted. No erythema.  Psychiatric: She has a normal mood and affect. Her behavior is normal. Judgment and thought content normal.   R poster arm lesion  Lab Results  Component Value Date   WBC 7.9 07/03/2011   HGB 12.5 07/03/2011   HCT 36.6 07/03/2011   PLT 318.0 07/03/2011   GLUCOSE 104* 07/03/2011   CHOL 210* 07/03/2011   TRIG 240.0* 07/03/2011   HDL 38.10* 07/03/2011   LDLDIRECT 121.0 07/03/2011   LDLCALC 114* 01/27/2007   ALT 18 07/03/2011   AST 19 07/03/2011   NA 139 07/03/2011   K 4.3 07/03/2011   CL 105 07/03/2011   CREATININE 0.7 07/03/2011   BUN 21 07/03/2011   CO2 27 07/03/2011   TSH 2.35 07/03/2011   HGBA1C 6.1 02/03/2010        Assessment & Plan:

## 2013-08-10 ENCOUNTER — Other Ambulatory Visit (INDEPENDENT_AMBULATORY_CARE_PROVIDER_SITE_OTHER): Payer: BC Managed Care – PPO

## 2013-08-10 ENCOUNTER — Other Ambulatory Visit: Payer: Self-pay

## 2013-08-10 DIAGNOSIS — Z1231 Encounter for screening mammogram for malignant neoplasm of breast: Secondary | ICD-10-CM

## 2013-08-10 DIAGNOSIS — Z85038 Personal history of other malignant neoplasm of large intestine: Secondary | ICD-10-CM

## 2013-08-10 DIAGNOSIS — F3289 Other specified depressive episodes: Secondary | ICD-10-CM

## 2013-08-10 DIAGNOSIS — E538 Deficiency of other specified B group vitamins: Secondary | ICD-10-CM

## 2013-08-10 DIAGNOSIS — E039 Hypothyroidism, unspecified: Secondary | ICD-10-CM

## 2013-08-10 DIAGNOSIS — F329 Major depressive disorder, single episode, unspecified: Secondary | ICD-10-CM

## 2013-08-10 DIAGNOSIS — D509 Iron deficiency anemia, unspecified: Secondary | ICD-10-CM

## 2013-08-10 LAB — CBC WITH DIFFERENTIAL/PLATELET
BASOS ABS: 0.1 10*3/uL (ref 0.0–0.1)
Basophils Relative: 0.8 % (ref 0.0–3.0)
EOS ABS: 0.3 10*3/uL (ref 0.0–0.7)
Eosinophils Relative: 3.4 % (ref 0.0–5.0)
HCT: 38.9 % (ref 36.0–46.0)
HEMOGLOBIN: 12.9 g/dL (ref 12.0–15.0)
LYMPHS ABS: 1.9 10*3/uL (ref 0.7–4.0)
Lymphocytes Relative: 24.3 % (ref 12.0–46.0)
MCHC: 33.3 g/dL (ref 30.0–36.0)
MCV: 86.7 fl (ref 78.0–100.0)
MONO ABS: 0.5 10*3/uL (ref 0.1–1.0)
MONOS PCT: 6.8 % (ref 3.0–12.0)
NEUTROS ABS: 5.1 10*3/uL (ref 1.4–7.7)
Neutrophils Relative %: 64.7 % (ref 43.0–77.0)
Platelets: 331 10*3/uL (ref 150.0–400.0)
RBC: 4.49 Mil/uL (ref 3.87–5.11)
RDW: 14 % (ref 11.5–14.6)
WBC: 7.9 10*3/uL (ref 4.5–10.5)

## 2013-08-10 LAB — CEA: CEA: 2.3 ng/mL (ref 0.0–5.0)

## 2013-08-10 LAB — BASIC METABOLIC PANEL
BUN: 20 mg/dL (ref 6–23)
CO2: 27 mEq/L (ref 19–32)
Calcium: 9.5 mg/dL (ref 8.4–10.5)
Chloride: 105 mEq/L (ref 96–112)
Creatinine, Ser: 0.9 mg/dL (ref 0.4–1.2)
GFR: 62.38 mL/min (ref >60.00)
Glucose, Bld: 115 mg/dL — ABNORMAL HIGH (ref 70–99)
Potassium: 4.2 mEq/L (ref 3.5–5.1)
SODIUM: 139 meq/L (ref 135–145)

## 2013-08-10 LAB — URINALYSIS
Bilirubin Urine: NEGATIVE
HGB URINE DIPSTICK: NEGATIVE
Ketones, ur: NEGATIVE
Leukocytes, UA: NEGATIVE
NITRITE: NEGATIVE
Specific Gravity, Urine: 1.025 (ref 1.000–1.030)
TOTAL PROTEIN, URINE-UPE24: NEGATIVE
UROBILINOGEN UA: 0.2 (ref 0.0–1.0)
Urine Glucose: NEGATIVE
pH: 5.5 (ref 5.0–8.0)

## 2013-08-10 LAB — LIPID PANEL
CHOLESTEROL: 206 mg/dL — AB (ref 0–200)
HDL: 39.5 mg/dL (ref >39.00)
Total CHOL/HDL Ratio: 5
Triglycerides: 217 mg/dL — ABNORMAL HIGH (ref 0.0–149.0)
VLDL: 43.4 mg/dL — ABNORMAL HIGH (ref 0.0–40.0)

## 2013-08-10 LAB — HEPATIC FUNCTION PANEL
ALBUMIN: 4.1 g/dL (ref 3.5–5.2)
ALT: 20 U/L (ref 0–35)
AST: 19 U/L (ref 0–37)
Alkaline Phosphatase: 81 U/L (ref 39–117)
Bilirubin, Direct: 0 mg/dL (ref 0.0–0.3)
Total Bilirubin: 0.4 mg/dL (ref 0.3–1.2)
Total Protein: 8 g/dL (ref 6.0–8.3)

## 2013-08-10 LAB — LDL CHOLESTEROL, DIRECT: Direct LDL: 131.6 mg/dL

## 2013-08-10 LAB — VITAMIN B12: VITAMIN B 12: 1190 pg/mL — AB (ref 211–911)

## 2013-08-10 LAB — TSH: TSH: 22.81 u[IU]/mL — AB (ref 0.35–5.50)

## 2013-08-11 ENCOUNTER — Other Ambulatory Visit: Payer: Self-pay | Admitting: Internal Medicine

## 2013-08-11 DIAGNOSIS — Z85038 Personal history of other malignant neoplasm of large intestine: Secondary | ICD-10-CM

## 2013-08-12 ENCOUNTER — Other Ambulatory Visit: Payer: Self-pay | Admitting: *Deleted

## 2013-08-12 DIAGNOSIS — E039 Hypothyroidism, unspecified: Secondary | ICD-10-CM

## 2013-09-30 ENCOUNTER — Encounter: Payer: Self-pay | Admitting: Internal Medicine

## 2013-09-30 ENCOUNTER — Ambulatory Visit (INDEPENDENT_AMBULATORY_CARE_PROVIDER_SITE_OTHER): Payer: BC Managed Care – PPO | Admitting: Internal Medicine

## 2013-09-30 ENCOUNTER — Other Ambulatory Visit (INDEPENDENT_AMBULATORY_CARE_PROVIDER_SITE_OTHER): Payer: BC Managed Care – PPO

## 2013-09-30 VITALS — BP 150/82 | HR 62 | Temp 98.5°F | Wt 160.0 lb

## 2013-09-30 DIAGNOSIS — F419 Anxiety disorder, unspecified: Secondary | ICD-10-CM | POA: Insufficient documentation

## 2013-09-30 DIAGNOSIS — Z85038 Personal history of other malignant neoplasm of large intestine: Secondary | ICD-10-CM

## 2013-09-30 DIAGNOSIS — F411 Generalized anxiety disorder: Secondary | ICD-10-CM

## 2013-09-30 DIAGNOSIS — E039 Hypothyroidism, unspecified: Secondary | ICD-10-CM

## 2013-09-30 DIAGNOSIS — E538 Deficiency of other specified B group vitamins: Secondary | ICD-10-CM

## 2013-09-30 DIAGNOSIS — H8110 Benign paroxysmal vertigo, unspecified ear: Secondary | ICD-10-CM | POA: Insufficient documentation

## 2013-09-30 LAB — TSH: TSH: 13.62 u[IU]/mL — ABNORMAL HIGH (ref 0.35–5.50)

## 2013-09-30 MED ORDER — MECLIZINE HCL 12.5 MG PO TABS: 12.5000 mg | ORAL_TABLET | Freq: Three times a day (TID) | ORAL | Status: DC | PRN

## 2013-09-30 MED ORDER — LORAZEPAM 0.5 MG PO TABS: 0.5000 mg | ORAL_TABLET | Freq: Two times a day (BID) | ORAL | Status: DC | PRN

## 2013-09-30 NOTE — Assessment & Plan Note (Signed)
Lorazepam prn 

## 2013-09-30 NOTE — Patient Instructions (Signed)
Benign Positional Vertigo symptoms Start Meclizine. Start Brandt - Daroff exercise several times a day as dirrected.  

## 2013-09-30 NOTE — Progress Notes (Signed)
Pre visit review using our clinic review tool, if applicable. No additional management support is needed unless otherwise documented below in the visit note. 

## 2013-09-30 NOTE — Assessment & Plan Note (Signed)
Continue with current prescription therapy as reflected on the Med list.  

## 2013-09-30 NOTE — Assessment & Plan Note (Addendum)
Benign Positional Vertigo symptoms  Start Meclizine. Start Laruth Bouchard - Daroff exercise several times a day as dirrected. No driving

## 2013-09-30 NOTE — Progress Notes (Signed)
Patient ID: Whitney [name], female   DOB: 1941-04-20, 73 y.o.   MRN: [id number]   Subjective:     Dizziness This is a new problem. The current episode started in the past 7 days (woke up on Sunday dizzy. Had a stomach virurus prior). The problem occurs 2 to 4 times per day. The problem has been waxing and waning. Associated symptoms include fatigue, vertigo and vomiting (on Sun). Pertinent negatives include no abdominal pain, chills, congestion, coughing, diaphoresis, headaches, nausea, numbness, rash, visual change or weakness.    The patient is here to follow up on colon Ca, chronic depression, anxiety, B12 def and hypothyroidism controlled with medicines, diet and exercise.  Wt Readings from Last 3 Encounters:  09/30/13 160 lb (72.576 kg)  08/03/13 161 lb (73.029 kg)  06/19/13 163 lb 12.8 oz (74.299 kg)   BP Readings from Last 3 Encounters:  09/30/13 150/82  08/03/13 138/82  06/19/13 134/80       Review of Systems  Constitutional: Positive for fatigue. Negative for chills, diaphoresis, activity change, appetite change and unexpected weight change.  HENT: Negative for congestion, mouth sores and sinus pressure.   Eyes: Negative for visual disturbance.  Respiratory: Negative for cough and chest tightness.   Gastrointestinal: Positive for vomiting (on Sun). Negative for nausea and abdominal pain.  Genitourinary: Negative for frequency, difficulty urinating and vaginal pain.  Musculoskeletal: Negative for back pain and gait problem.  Skin: Negative for pallor and rash.  Neurological: Positive for vertigo. Negative for dizziness, tremors, weakness, numbness and headaches.  Psychiatric/Behavioral: Negative for confusion and sleep disturbance.       Objective:   Physical Exam  Constitutional: She appears well-developed. No distress.  Obese   HENT:  Head: Normocephalic.  Right Ear: External ear normal.  Left Ear: External ear normal.  Nose: Nose normal.  Mouth/Throat:  Oropharynx is clear and moist.  Eyes: Conjunctivae are normal. Pupils are equal, round, and reactive to light. Right eye exhibits no discharge. Left eye exhibits no discharge.  Neck: Normal range of motion. Neck supple. No JVD present. No tracheal deviation present. No thyromegaly present.  Cardiovascular: Normal rate, regular rhythm and normal heart sounds.   Pulmonary/Chest: No stridor. No respiratory distress. She has no wheezes.  Abdominal: Soft. Bowel sounds are normal. She exhibits no distension and no mass. There is no tenderness. There is no rebound and no guarding.  Musculoskeletal: She exhibits no edema and no tenderness.  Lymphadenopathy:    She has no cervical adenopathy.  Neurological: She displays normal reflexes. No cranial nerve deficit. She exhibits normal muscle tone. Coordination normal.  Skin: No rash (behind R ear) noted. No erythema.  Psychiatric: She has a normal mood and affect. Her behavior is normal. Judgment and thought content normal.   R poster arm lesion  Lab Results  Component Value Date   WBC 7.9 08/10/2013   HGB 12.9 08/10/2013   HCT 38.9 08/10/2013   PLT 331.0 08/10/2013   GLUCOSE 115* 08/10/2013   CHOL 206* 08/10/2013   TRIG 217.0* 08/10/2013   HDL 39.50 08/10/2013   LDLDIRECT 131.6 08/10/2013   LDLCALC 114* 01/27/2007   ALT 20 08/10/2013   AST 19 08/10/2013   NA 139 08/10/2013   K 4.2 08/10/2013   CL 105 08/10/2013   CREATININE 0.9 08/10/2013   BUN 20 08/10/2013   CO2 27 08/10/2013   TSH 22.81* 08/10/2013   HGBA1C 6.1 02/03/2010        Assessment & Plan:

## 2013-10-01 LAB — CEA: CEA: 2.6 ng/mL (ref 0.0–5.0)

## 2013-10-01 MED ORDER — LEVOTHYROXINE SODIUM 125 MCG PO TABS: 125.0000 ug | ORAL_TABLET | Freq: Every day | ORAL | Status: DC

## 2013-10-01 NOTE — Addendum Note (Signed)
Addended by: Cassandria Anger on: 10/01/2013 11:44 PM   Modules accepted: Orders, Medications

## 2013-10-02 ENCOUNTER — Other Ambulatory Visit: Payer: Self-pay | Admitting: *Deleted

## 2013-10-02 DIAGNOSIS — E039 Hypothyroidism, unspecified: Secondary | ICD-10-CM

## 2014-01-15 ENCOUNTER — Telehealth: Payer: Self-pay | Admitting: Internal Medicine

## 2014-01-15 DIAGNOSIS — E039 Hypothyroidism, unspecified: Secondary | ICD-10-CM

## 2014-01-15 NOTE — Telephone Encounter (Signed)
Patient left VM on the triage line in regards to her increased dosage of Synthroid that she is now taking. She wants to know if she should go to the lab to have her TSH checked. Please advise.

## 2014-01-16 NOTE — Telephone Encounter (Signed)
Yes, pls check TSH, FT4 Thx

## 2014-01-18 ENCOUNTER — Other Ambulatory Visit (INDEPENDENT_AMBULATORY_CARE_PROVIDER_SITE_OTHER): Payer: BC Managed Care – PPO

## 2014-01-18 DIAGNOSIS — E039 Hypothyroidism, unspecified: Secondary | ICD-10-CM

## 2014-01-18 LAB — TSH: TSH: 1.29 u[IU]/mL (ref 0.35–4.50)

## 2014-01-18 LAB — T4, FREE: FREE T4: 0.93 ng/dL (ref 0.60–1.60)

## 2014-01-18 NOTE — Telephone Encounter (Signed)
Pt informed

## 2014-02-01 ENCOUNTER — Encounter: Payer: BC Managed Care – PPO | Admitting: Internal Medicine

## 2014-02-26 ENCOUNTER — Ambulatory Visit
Admission: RE | Admit: 2014-02-26 | Discharge: 2014-02-26 | Disposition: A | Discharge status: Home / Self Care | Payer: BC Managed Care – PPO | Source: Ambulatory Visit

## 2014-02-26 DIAGNOSIS — Z1231 Encounter for screening mammogram for malignant neoplasm of breast: Secondary | ICD-10-CM

## 2014-10-11 ENCOUNTER — Other Ambulatory Visit: Payer: Self-pay | Admitting: Internal Medicine

## 2014-11-04 ENCOUNTER — Other Ambulatory Visit: Payer: Self-pay | Admitting: Internal Medicine

## 2014-12-14 ENCOUNTER — Other Ambulatory Visit: Payer: Self-pay | Admitting: Internal Medicine

## 2014-12-15 ENCOUNTER — Other Ambulatory Visit: Payer: Self-pay | Admitting: Internal Medicine

## 2014-12-17 ENCOUNTER — Telehealth: Payer: Self-pay | Admitting: Internal Medicine

## 2014-12-17 ENCOUNTER — Other Ambulatory Visit (INDEPENDENT_AMBULATORY_CARE_PROVIDER_SITE_OTHER): Payer: BLUE CROSS/BLUE SHIELD

## 2014-12-17 ENCOUNTER — Encounter: Payer: Self-pay | Admitting: Internal Medicine

## 2014-12-17 ENCOUNTER — Ambulatory Visit (INDEPENDENT_AMBULATORY_CARE_PROVIDER_SITE_OTHER): Payer: BLUE CROSS/BLUE SHIELD | Admitting: Internal Medicine

## 2014-12-17 VITALS — BP 130/70 | HR 62 | Temp 98.1°F | Ht 62.5 in | Wt 163.8 lb

## 2014-12-17 DIAGNOSIS — E038 Other specified hypothyroidism: Secondary | ICD-10-CM

## 2014-12-17 DIAGNOSIS — E034 Atrophy of thyroid (acquired): Secondary | ICD-10-CM

## 2014-12-17 DIAGNOSIS — Z8249 Family history of ischemic heart disease and other diseases of the circulatory system: Secondary | ICD-10-CM

## 2014-12-17 DIAGNOSIS — Z85038 Personal history of other malignant neoplasm of large intestine: Secondary | ICD-10-CM

## 2014-12-17 DIAGNOSIS — E538 Deficiency of other specified B group vitamins: Secondary | ICD-10-CM

## 2014-12-17 DIAGNOSIS — Z23 Encounter for immunization: Secondary | ICD-10-CM | POA: Diagnosis not present

## 2014-12-17 LAB — BASIC METABOLIC PANEL
BUN: 16 mg/dL (ref 6–23)
CO2: 28 meq/L (ref 19–32)
Calcium: 9.8 mg/dL (ref 8.4–10.5)
Chloride: 103 mEq/L (ref 96–112)
Creatinine, Ser: 0.76 mg/dL (ref 0.40–1.20)
GFR: 79.42 mL/min (ref >60.00)
Glucose, Bld: 106 mg/dL — ABNORMAL HIGH (ref 70–99)
Potassium: 3.8 mEq/L (ref 3.5–5.1)
Sodium: 136 mEq/L (ref 135–145)

## 2014-12-17 LAB — VITAMIN D 25 HYDROXY (VIT D DEFICIENCY, FRACTURES): VITD: 35.58 ng/mL (ref 30.00–100.00)

## 2014-12-17 LAB — VITAMIN B12: Vitamin B-12: 1120 pg/mL — ABNORMAL HIGH (ref 211–911)

## 2014-12-17 LAB — CBC WITH DIFFERENTIAL/PLATELET
BASOS PCT: 0.6 % (ref 0.0–3.0)
Basophils Absolute: 0 10*3/uL (ref 0.0–0.1)
EOS ABS: 0.6 10*3/uL (ref 0.0–0.7)
Eosinophils Relative: 6.9 % — ABNORMAL HIGH (ref 0.0–5.0)
HEMATOCRIT: 37.1 % (ref 36.0–46.0)
Hemoglobin: 12.4 g/dL (ref 12.0–15.0)
LYMPHS PCT: 28 % (ref 12.0–46.0)
Lymphs Abs: 2.4 10*3/uL (ref 0.7–4.0)
MCHC: 33.4 g/dL (ref 30.0–36.0)
MCV: 85.3 fl (ref 78.0–100.0)
MONOS PCT: 7.2 % (ref 3.0–12.0)
Monocytes Absolute: 0.6 10*3/uL (ref 0.1–1.0)
Neutro Abs: 4.9 10*3/uL (ref 1.4–7.7)
Neutrophils Relative %: 57.3 % (ref 43.0–77.0)
Platelets: 394 10*3/uL (ref 150.0–400.0)
RBC: 4.35 Mil/uL (ref 3.87–5.11)
RDW: 14.2 % (ref 11.5–15.5)
WBC: 8.6 10*3/uL (ref 4.0–10.5)

## 2014-12-17 LAB — HEPATIC FUNCTION PANEL
ALK PHOS: 97 U/L (ref 39–117)
ALT: 16 U/L (ref 0–35)
AST: 16 U/L (ref 0–37)
Albumin: 4 g/dL (ref 3.5–5.2)
BILIRUBIN DIRECT: 0.1 mg/dL (ref 0.0–0.3)
Total Bilirubin: 0.5 mg/dL (ref 0.2–1.2)
Total Protein: 7.5 g/dL (ref 6.0–8.3)

## 2014-12-17 LAB — TSH: TSH: 1.04 u[IU]/mL (ref 0.35–4.50)

## 2014-12-17 LAB — T4, FREE: FREE T4: 1.29 ng/dL (ref 0.60–1.60)

## 2014-12-17 NOTE — Telephone Encounter (Signed)
Patient wanted to advise that synthroid 125 mcg tab abv- generic levothyroxine sodium is what her pill bottle says

## 2014-12-17 NOTE — Assessment & Plan Note (Signed)
CEA Colon is being sch Dr Watt Climes

## 2014-12-17 NOTE — Assessment & Plan Note (Signed)
On ASA 

## 2014-12-17 NOTE — Assessment & Plan Note (Signed)
B12 level 

## 2014-12-17 NOTE — Progress Notes (Signed)
   Subjective:     HPI  The patient is here to follow up on colon Ca, chronic depression, anxiety, B12 def and hypothyroidism controlled with medicines, diet and exercise.  Wt Readings from Last 3 Encounters:  12/17/14 163 lb 12 oz (74.277 kg)  09/30/13 160 lb (72.576 kg)  08/03/13 161 lb (73.029 kg)   BP Readings from Last 3 Encounters:  12/17/14 130/70  09/30/13 150/82  08/03/13 138/82       Review of Systems  Constitutional: Negative for activity change, appetite change and unexpected weight change.  HENT: Negative for mouth sores and sinus pressure.   Eyes: Negative for visual disturbance.  Respiratory: Negative for chest tightness.   Genitourinary: Negative for frequency, difficulty urinating and vaginal pain.  Musculoskeletal: Negative for back pain and gait problem.  Skin: Negative for pallor.  Neurological: Negative for tremors.  Psychiatric/Behavioral: Negative for confusion and sleep disturbance.       Objective:   Physical Exam  Constitutional: She appears well-developed. No distress.  Obese   HENT:  Head: Normocephalic.  Right Ear: External ear normal.  Left Ear: External ear normal.  Nose: Nose normal.  Mouth/Throat: Oropharynx is clear and moist.  Eyes: Conjunctivae are normal. Pupils are equal, round, and reactive to light. Right eye exhibits no discharge. Left eye exhibits no discharge.  Neck: Normal range of motion. Neck supple. No JVD present. No tracheal deviation present. No thyromegaly present.  Cardiovascular: Normal rate, regular rhythm and normal heart sounds.   Pulmonary/Chest: No stridor. No respiratory distress. She has no wheezes.  Abdominal: Soft. Bowel sounds are normal. She exhibits no distension and no mass. There is no tenderness. There is no rebound and no guarding.  Musculoskeletal: She exhibits no edema or tenderness.  Lymphadenopathy:    She has no cervical adenopathy.  Neurological: She displays normal reflexes. No cranial  nerve deficit. She exhibits normal muscle tone. Coordination normal.  Skin: No rash noted. No erythema.  Psychiatric: She has a normal mood and affect. Her behavior is normal. Judgment and thought content normal.     Lab Results  Component Value Date   WBC 7.9 08/10/2013   HGB 12.9 08/10/2013   HCT 38.9 08/10/2013   PLT 331.0 08/10/2013   GLUCOSE 115* 08/10/2013   CHOL 206* 08/10/2013   TRIG 217.0* 08/10/2013   HDL 39.50 08/10/2013   LDLDIRECT 131.6 08/10/2013   LDLCALC 114* 01/27/2007   ALT 20 08/10/2013   AST 19 08/10/2013   NA 139 08/10/2013   K 4.2 08/10/2013   CL 105 08/10/2013   CREATININE 0.9 08/10/2013   BUN 20 08/10/2013   CO2 27 08/10/2013   TSH 1.29 01/18/2014   HGBA1C 6.1 02/03/2010        Assessment & Plan:

## 2014-12-17 NOTE — Progress Notes (Signed)
Pre visit review using our clinic review tool, if applicable. No additional management support is needed unless otherwise documented below in the visit note. 

## 2014-12-17 NOTE — Assessment & Plan Note (Signed)
On Levothroid 

## 2014-12-18 ENCOUNTER — Other Ambulatory Visit: Payer: Self-pay | Admitting: Internal Medicine

## 2014-12-18 LAB — CEA: CEA: 2.3 ng/mL (ref 0.0–5.0)

## 2014-12-21 NOTE — Telephone Encounter (Signed)
Patient is needing her prescription for synthroid filled at [facility] on [address].

## 2014-12-21 NOTE — Telephone Encounter (Signed)
The refill was sent this morning. See meds. Left detailed mess informing pt.

## 2015-01-27 DIAGNOSIS — Z980 Intestinal bypass and anastomosis status: Secondary | ICD-10-CM | POA: Diagnosis not present

## 2015-01-27 DIAGNOSIS — Z09 Encounter for follow-up examination after completed treatment for conditions other than malignant neoplasm: Secondary | ICD-10-CM | POA: Diagnosis not present

## 2015-01-27 DIAGNOSIS — Z85038 Personal history of other malignant neoplasm of large intestine: Secondary | ICD-10-CM | POA: Diagnosis not present

## 2015-05-24 ENCOUNTER — Other Ambulatory Visit: Payer: Self-pay | Admitting: Internal Medicine

## 2015-06-27 ENCOUNTER — Encounter: Payer: BLUE CROSS/BLUE SHIELD | Admitting: Internal Medicine

## 2015-07-04 ENCOUNTER — Other Ambulatory Visit: Payer: Self-pay

## 2015-07-04 ENCOUNTER — Telehealth: Payer: Self-pay

## 2015-07-04 DIAGNOSIS — Z1231 Encounter for screening mammogram for malignant neoplasm of breast: Secondary | ICD-10-CM

## 2015-07-04 NOTE — Telephone Encounter (Signed)
Call to educate on Wellness; no answer to home number and no VM on cell;

## 2015-07-05 NOTE — Telephone Encounter (Signed)
Outreach 2nd time to both numbers; LVM for return call for AWV.

## 2015-08-04 ENCOUNTER — Ambulatory Visit
Admission: RE | Admit: 2015-08-04 | Discharge: 2015-08-04 | Disposition: A | Discharge status: Home / Self Care | Payer: BLUE CROSS/BLUE SHIELD | Source: Ambulatory Visit

## 2015-08-04 DIAGNOSIS — Z1231 Encounter for screening mammogram for malignant neoplasm of breast: Secondary | ICD-10-CM

## 2016-01-03 ENCOUNTER — Other Ambulatory Visit: Payer: Self-pay | Admitting: Internal Medicine

## 2016-02-01 ENCOUNTER — Other Ambulatory Visit: Payer: Self-pay | Admitting: Internal Medicine

## 2016-02-18 ENCOUNTER — Other Ambulatory Visit: Payer: Self-pay | Admitting: Internal Medicine

## 2016-03-01 ENCOUNTER — Other Ambulatory Visit (INDEPENDENT_AMBULATORY_CARE_PROVIDER_SITE_OTHER): Payer: BLUE CROSS/BLUE SHIELD

## 2016-03-01 ENCOUNTER — Encounter: Payer: Self-pay | Admitting: Internal Medicine

## 2016-03-01 ENCOUNTER — Ambulatory Visit (INDEPENDENT_AMBULATORY_CARE_PROVIDER_SITE_OTHER): Payer: BLUE CROSS/BLUE SHIELD | Admitting: Internal Medicine

## 2016-03-01 VITALS — BP 124/60 | HR 73 | Ht 63.0 in | Wt 160.0 lb

## 2016-03-01 DIAGNOSIS — E538 Deficiency of other specified B group vitamins: Secondary | ICD-10-CM | POA: Diagnosis not present

## 2016-03-01 DIAGNOSIS — E038 Other specified hypothyroidism: Secondary | ICD-10-CM

## 2016-03-01 DIAGNOSIS — Z0000 Encounter for general adult medical examination without abnormal findings: Secondary | ICD-10-CM | POA: Diagnosis not present

## 2016-03-01 DIAGNOSIS — Z23 Encounter for immunization: Secondary | ICD-10-CM | POA: Diagnosis not present

## 2016-03-01 DIAGNOSIS — Z8249 Family history of ischemic heart disease and other diseases of the circulatory system: Secondary | ICD-10-CM | POA: Diagnosis not present

## 2016-03-01 DIAGNOSIS — E034 Atrophy of thyroid (acquired): Secondary | ICD-10-CM

## 2016-03-01 LAB — URINALYSIS
BILIRUBIN URINE: NEGATIVE
HGB URINE DIPSTICK: NEGATIVE
Ketones, ur: NEGATIVE
Leukocytes, UA: NEGATIVE
NITRITE: NEGATIVE
PH: 5.5 (ref 5.0–8.0)
Specific Gravity, Urine: 1.025 (ref 1.000–1.030)
TOTAL PROTEIN, URINE-UPE24: NEGATIVE
URINE GLUCOSE: NEGATIVE
UROBILINOGEN UA: 0.2 (ref 0.0–1.0)

## 2016-03-01 LAB — CBC WITH DIFFERENTIAL/PLATELET
BASOS PCT: 0.4 % (ref 0.0–3.0)
Basophils Absolute: 0 10*3/uL (ref 0.0–0.1)
EOS PCT: 2.9 % (ref 0.0–5.0)
Eosinophils Absolute: 0.3 10*3/uL (ref 0.0–0.7)
HCT: 38.4 % (ref 36.0–46.0)
HEMOGLOBIN: 12.8 g/dL (ref 12.0–15.0)
Lymphocytes Relative: 25.4 % (ref 12.0–46.0)
Lymphs Abs: 2.4 10*3/uL (ref 0.7–4.0)
MCHC: 33.2 g/dL (ref 30.0–36.0)
MCV: 85 fl (ref 78.0–100.0)
MONO ABS: 0.7 10*3/uL (ref 0.1–1.0)
Monocytes Relative: 8 % (ref 3.0–12.0)
NEUTROS ABS: 5.9 10*3/uL (ref 1.4–7.7)
Neutrophils Relative %: 63.3 % (ref 43.0–77.0)
PLATELETS: 357 10*3/uL (ref 150.0–400.0)
RBC: 4.52 Mil/uL (ref 3.87–5.11)
RDW: 14.1 % (ref 11.5–15.5)
WBC: 9.3 10*3/uL (ref 4.0–10.5)

## 2016-03-01 LAB — HEPATIC FUNCTION PANEL
ALT: 15 U/L (ref 0–35)
AST: 13 U/L (ref 0–37)
Albumin: 3.9 g/dL (ref 3.5–5.2)
Alkaline Phosphatase: 103 U/L (ref 39–117)
BILIRUBIN DIRECT: 0.1 mg/dL (ref 0.0–0.3)
BILIRUBIN TOTAL: 0.4 mg/dL (ref 0.2–1.2)
Total Protein: 7.5 g/dL (ref 6.0–8.3)

## 2016-03-01 LAB — BASIC METABOLIC PANEL
BUN: 12 mg/dL (ref 6–23)
CHLORIDE: 103 meq/L (ref 96–112)
CO2: 29 meq/L (ref 19–32)
CREATININE: 0.78 mg/dL (ref 0.40–1.20)
Calcium: 9.6 mg/dL (ref 8.4–10.5)
GFR: 76.82 mL/min (ref >60.00)
GLUCOSE: 120 mg/dL — AB (ref 70–99)
POTASSIUM: 4.1 meq/L (ref 3.5–5.1)
Sodium: 138 mEq/L (ref 135–145)

## 2016-03-01 LAB — VITAMIN B12: Vitamin B-12: 905 pg/mL (ref 211–911)

## 2016-03-01 LAB — LIPID PANEL
CHOL/HDL RATIO: 5
Cholesterol: 183 mg/dL (ref 0–200)
HDL: 36.9 mg/dL — AB (ref >39.00)
NONHDL: 146.15
Triglycerides: 205 mg/dL — ABNORMAL HIGH (ref 0.0–149.0)
VLDL: 41 mg/dL — ABNORMAL HIGH (ref 0.0–40.0)

## 2016-03-01 LAB — TSH: TSH: 0.69 u[IU]/mL (ref 0.35–4.50)

## 2016-03-01 LAB — LDL CHOLESTEROL, DIRECT: LDL DIRECT: 116 mg/dL

## 2016-03-01 MED ORDER — SYNTHROID 125 MCG PO TABS: 125.0000 ug | ORAL_TABLET | Freq: Every day | ORAL | 11 refills | Status: DC

## 2016-03-01 NOTE — Progress Notes (Signed)
Subjective:  Patient ID: Whitney [name], female    DOB: 28-Mar-1941  Age: 75 y.o. MRN: [id number]  CC: No chief complaint on file.   HPI Alvis R Harten presents for a well exam C/o LBP last week - resolved C/o stress w/husbannd he was ill  Outpatient Medications Prior to Visit  Medication Sig Dispense Refill  . acetaminophen (TYLENOL) 500 MG tablet Take 500 mg by mouth every 6 (six) hours as needed.      Marland Kitchen aspirin 81 MG tablet Take 81 mg by mouth daily.    . cholecalciferol (VITAMIN D) 1000 UNITS tablet Take 1 tablet (1,000 Units total) by mouth daily. 100 tablet 3  . Cyanocobalamin (VITAMIN B-12) 1000 MCG SUBL Place 1 tablet (1,000 mcg total) under the tongue daily. 100 tablet 3  . FLUoxetine (PROZAC) 10 MG capsule TAKE ONE CAPSULE BY MOUTH EVERY DAY 90 capsule 0  . LORazepam (ATIVAN) 0.5 MG tablet Take 1 tablet (0.5 mg total) by mouth 2 (two) times daily as needed for anxiety. 30 tablet 0  . meclizine (ANTIVERT) 12.5 MG tablet Take 1 tablet (12.5 mg total) by mouth 3 (three) times daily as needed for dizziness. 60 tablet 0  . SYNTHROID 125 MCG tablet TAKE ONE TABLET BY MOUTH ONCE DAILY BEFORE  BREAKFAST 15 tablet 0   No facility-administered medications prior to visit.     ROS Review of Systems  Constitutional: Negative for activity change, appetite change, chills, fatigue and unexpected weight change.  HENT: Negative for congestion, mouth sores and sinus pressure.   Eyes: Negative for visual disturbance.  Respiratory: Negative for cough and chest tightness.   Gastrointestinal: Negative for abdominal pain and nausea.  Genitourinary: Negative for difficulty urinating, frequency and vaginal pain.  Musculoskeletal: Positive for back pain. Negative for gait problem.  Skin: Negative for pallor and rash.  Neurological: Negative for dizziness, tremors, weakness, numbness and headaches.  Psychiatric/Behavioral: Negative for confusion and sleep disturbance.    Objective:  BP  124/60   Pulse 73   Ht 5\' 3"  (1.6 m)   Wt 160 lb (72.6 kg)   SpO2 96%   BMI 28.34 kg/m   BP Readings from Last 3 Encounters:  03/01/16 124/60  12/17/14 130/70  09/30/13 (!) 150/82    Wt Readings from Last 3 Encounters:  03/01/16 160 lb (72.6 kg)  12/17/14 163 lb 12 oz (74.3 kg)  09/30/13 160 lb (72.6 kg)    Physical Exam  Constitutional: She appears well-developed. No distress.  HENT:  Head: Normocephalic.  Right Ear: External ear normal.  Left Ear: External ear normal.  Nose: Nose normal.  Mouth/Throat: Oropharynx is clear and moist.  Eyes: Conjunctivae are normal. Pupils are equal, round, and reactive to light. Right eye exhibits no discharge. Left eye exhibits no discharge.  Neck: Normal range of motion. Neck supple. No JVD present. No tracheal deviation present. No thyromegaly present.  Cardiovascular: Normal rate, regular rhythm and normal heart sounds.   Pulmonary/Chest: No stridor. No respiratory distress. She has no wheezes.  Abdominal: Soft. Bowel sounds are normal. She exhibits no distension and no mass. There is no tenderness. There is no rebound and no guarding.  Musculoskeletal: She exhibits no edema or tenderness.  Lymphadenopathy:    She has no cervical adenopathy.  Neurological: She displays normal reflexes. No cranial nerve deficit. She exhibits normal muscle tone. Coordination normal.  Skin: No rash noted. No erythema.  Psychiatric: She has a normal mood and affect. Her behavior is normal.  Judgment and thought content normal.  overweight  Procedure: EKG Indication: fam h/o CAD Impression: NSR. No acute changes.  Lab Results  Component Value Date   WBC 8.6 12/17/2014   HGB 12.4 12/17/2014   HCT 37.1 12/17/2014   PLT 394.0 12/17/2014   GLUCOSE 106 (H) 12/17/2014   CHOL 206 (H) 08/10/2013   TRIG 217.0 (H) 08/10/2013   HDL 39.50 08/10/2013   LDLDIRECT 131.6 08/10/2013   LDLCALC 114 (H) 01/27/2007   ALT 16 12/17/2014   AST 16 12/17/2014   NA 136  12/17/2014   K 3.8 12/17/2014   CL 103 12/17/2014   CREATININE 0.76 12/17/2014   BUN 16 12/17/2014   CO2 28 12/17/2014   TSH 1.04 12/17/2014   HGBA1C 6.1 02/03/2010    Mm Screening Breast Tomo Bilateral  Result Date: 08/05/2015 CLINICAL DATA:  Screening. EXAM: DIGITAL SCREENING BILATERAL MAMMOGRAM WITH 3D TOMO WITH CAD COMPARISON:  Previous exam(s). ACR Breast Density Category b: There are scattered areas of fibroglandular density. FINDINGS: There are no findings suspicious for malignancy. Images were processed with CAD. IMPRESSION: No mammographic evidence of malignancy. A result letter of this screening mammogram will be mailed directly to the patient. RECOMMENDATION: Screening mammogram in one year. (Code:SM-B-01Y) BI-RADS CATEGORY  1: Negative. Electronically Signed   By: Fidela Salisbury M.D.   On: 08/05/2015 07:55    Assessment & Plan:   There are no diagnoses linked to this encounter. I am having Ms. Dahle maintain her acetaminophen, cholecalciferol, Vitamin B-12, aspirin, LORazepam, meclizine, FLUoxetine, and SYNTHROID.  No orders of the defined types were placed in this encounter.    Follow-up: No Follow-up on file.  Walker Kehr, MD

## 2016-03-01 NOTE — Progress Notes (Signed)
Pre visit review using our clinic review tool, if applicable. No additional management support is needed unless otherwise documented below in the visit note. 

## 2016-03-01 NOTE — Assessment & Plan Note (Signed)
Discussed F died at 77 w/MI M died at 55 Pt declined a stress test again

## 2016-03-01 NOTE — Assessment & Plan Note (Signed)
Labs

## 2016-03-01 NOTE — Assessment & Plan Note (Signed)
We discussed age appropriate health related issues, including available/recomended screening tests and vaccinations. We discussed a need for adhering to healthy diet and exercise. Labs/EKG were reviewed/ordered. All questions were answered.   

## 2016-03-01 NOTE — Assessment & Plan Note (Signed)
On B12 

## 2016-09-06 ENCOUNTER — Other Ambulatory Visit: Payer: Self-pay | Admitting: Internal Medicine

## 2016-09-06 DIAGNOSIS — Z1231 Encounter for screening mammogram for malignant neoplasm of breast: Secondary | ICD-10-CM

## 2016-09-24 ENCOUNTER — Ambulatory Visit
Admission: RE | Admit: 2016-09-24 | Discharge: 2016-09-24 | Disposition: A | Discharge status: Home / Self Care | Payer: Medicare Other | Source: Ambulatory Visit | Attending: Internal Medicine | Admitting: Internal Medicine

## 2016-09-24 DIAGNOSIS — Z1231 Encounter for screening mammogram for malignant neoplasm of breast: Secondary | ICD-10-CM | POA: Diagnosis not present

## 2017-03-01 ENCOUNTER — Encounter: Payer: BLUE CROSS/BLUE SHIELD | Admitting: Internal Medicine

## 2017-03-08 ENCOUNTER — Other Ambulatory Visit: Payer: Self-pay | Admitting: Internal Medicine

## 2017-04-10 ENCOUNTER — Other Ambulatory Visit: Payer: Self-pay | Admitting: Internal Medicine

## 2017-04-16 ENCOUNTER — Other Ambulatory Visit: Payer: Self-pay | Admitting: Internal Medicine

## 2017-04-16 MED ORDER — FLUOXETINE HCL 10 MG PO CAPS: 10.0000 mg | ORAL_CAPSULE | Freq: Every day | ORAL | 0 refills | Status: DC

## 2017-04-16 NOTE — Telephone Encounter (Signed)
Per office policy sent 30 day to local pharmacy until appt.../lmb  

## 2017-04-16 NOTE — Telephone Encounter (Signed)
Pt called for a refill of this med,  CPE appt made for 10/24 Please advise  She would also like a refill of her FLUoxetine (PROZAC) 10 MG capsule

## 2017-05-13 ENCOUNTER — Other Ambulatory Visit: Payer: Self-pay | Admitting: Internal Medicine

## 2017-05-13 NOTE — Telephone Encounter (Signed)
sch f/u

## 2017-05-15 ENCOUNTER — Encounter: Payer: Medicare Other | Admitting: Internal Medicine

## 2017-05-15 ENCOUNTER — Other Ambulatory Visit: Payer: Self-pay | Admitting: Internal Medicine

## 2017-05-22 ENCOUNTER — Ambulatory Visit (INDEPENDENT_AMBULATORY_CARE_PROVIDER_SITE_OTHER): Payer: BLUE CROSS/BLUE SHIELD | Admitting: Internal Medicine

## 2017-05-22 ENCOUNTER — Other Ambulatory Visit: Payer: Self-pay | Admitting: Internal Medicine

## 2017-05-22 ENCOUNTER — Encounter: Payer: Self-pay | Admitting: Internal Medicine

## 2017-05-22 ENCOUNTER — Other Ambulatory Visit (INDEPENDENT_AMBULATORY_CARE_PROVIDER_SITE_OTHER): Payer: BLUE CROSS/BLUE SHIELD

## 2017-05-22 VITALS — BP 144/72 | HR 57 | Temp 97.7°F | Ht 63.0 in | Wt 153.0 lb

## 2017-05-22 DIAGNOSIS — Z0000 Encounter for general adult medical examination without abnormal findings: Secondary | ICD-10-CM | POA: Diagnosis not present

## 2017-05-22 DIAGNOSIS — D500 Iron deficiency anemia secondary to blood loss (chronic): Secondary | ICD-10-CM | POA: Diagnosis not present

## 2017-05-22 DIAGNOSIS — E538 Deficiency of other specified B group vitamins: Secondary | ICD-10-CM

## 2017-05-22 DIAGNOSIS — Z85038 Personal history of other malignant neoplasm of large intestine: Secondary | ICD-10-CM | POA: Diagnosis not present

## 2017-05-22 LAB — URINALYSIS, ROUTINE W REFLEX MICROSCOPIC
Bilirubin Urine: NEGATIVE
Hgb urine dipstick: NEGATIVE
Ketones, ur: NEGATIVE
Nitrite: NEGATIVE
PH: 6 (ref 5.0–8.0)
RBC / HPF: NONE SEEN (ref 0–?)
SPECIFIC GRAVITY, URINE: 1.025 (ref 1.000–1.030)
Total Protein, Urine: NEGATIVE
UROBILINOGEN UA: 0.2 (ref 0.0–1.0)
Urine Glucose: NEGATIVE

## 2017-05-22 LAB — CBC WITH DIFFERENTIAL/PLATELET
BASOS ABS: 0.1 10*3/uL (ref 0.0–0.1)
BASOS PCT: 0.7 % (ref 0.0–3.0)
Eosinophils Absolute: 0.2 10*3/uL (ref 0.0–0.7)
Eosinophils Relative: 2.8 % (ref 0.0–5.0)
HCT: 38.4 % (ref 36.0–46.0)
Hemoglobin: 12.6 g/dL (ref 12.0–15.0)
Lymphocytes Relative: 26.5 % (ref 12.0–46.0)
Lymphs Abs: 2.2 10*3/uL (ref 0.7–4.0)
MCHC: 32.9 g/dL (ref 30.0–36.0)
MCV: 88.7 fl (ref 78.0–100.0)
MONO ABS: 0.7 10*3/uL (ref 0.1–1.0)
Monocytes Relative: 8.2 % (ref 3.0–12.0)
NEUTROS ABS: 5.2 10*3/uL (ref 1.4–7.7)
NEUTROS PCT: 61.8 % (ref 43.0–77.0)
PLATELETS: 352 10*3/uL (ref 150.0–400.0)
RBC: 4.33 Mil/uL (ref 3.87–5.11)
RDW: 14.1 % (ref 11.5–15.5)
WBC: 8.4 10*3/uL (ref 4.0–10.5)

## 2017-05-22 LAB — LIPID PANEL
CHOLESTEROL: 193 mg/dL (ref 0–200)
HDL: 41.1 mg/dL (ref >39.00)
LDL CALC: 119 mg/dL — AB (ref 0–99)
NonHDL: 151.98
Total CHOL/HDL Ratio: 5
Triglycerides: 167 mg/dL — ABNORMAL HIGH (ref 0.0–149.0)
VLDL: 33.4 mg/dL (ref 0.0–40.0)

## 2017-05-22 LAB — HEPATIC FUNCTION PANEL
ALK PHOS: 88 U/L (ref 39–117)
ALT: 14 U/L (ref 0–35)
AST: 16 U/L (ref 0–37)
Albumin: 4.1 g/dL (ref 3.5–5.2)
BILIRUBIN DIRECT: 0.1 mg/dL (ref 0.0–0.3)
BILIRUBIN TOTAL: 0.5 mg/dL (ref 0.2–1.2)
Total Protein: 7.7 g/dL (ref 6.0–8.3)

## 2017-05-22 LAB — IRON: Iron: 49 ug/dL (ref 42–145)

## 2017-05-22 LAB — BASIC METABOLIC PANEL
BUN: 16 mg/dL (ref 6–23)
CHLORIDE: 104 meq/L (ref 96–112)
CO2: 29 mEq/L (ref 19–32)
Calcium: 9.7 mg/dL (ref 8.4–10.5)
Creatinine, Ser: 0.69 mg/dL (ref 0.40–1.20)
GFR: 88.2 mL/min (ref >60.00)
Glucose, Bld: 116 mg/dL — ABNORMAL HIGH (ref 70–99)
POTASSIUM: 4.5 meq/L (ref 3.5–5.1)
SODIUM: 137 meq/L (ref 135–145)

## 2017-05-22 LAB — VITAMIN B12: Vitamin B-12: 1002 pg/mL — ABNORMAL HIGH (ref 211–911)

## 2017-05-22 LAB — IBC PANEL
IRON: 49 ug/dL (ref 42–145)
Saturation Ratios: 19.9 % — ABNORMAL LOW (ref 20.0–50.0)
TRANSFERRIN: 176 mg/dL — AB (ref 212.0–360.0)

## 2017-05-22 LAB — FERRITIN: FERRITIN: 68.3 ng/mL (ref 10.0–291.0)

## 2017-05-22 LAB — TSH: TSH: 5.31 u[IU]/mL — ABNORMAL HIGH (ref 0.35–4.50)

## 2017-05-22 MED ORDER — SYNTHROID 125 MCG PO TABS: 125.0000 ug | ORAL_TABLET | Freq: Every day | ORAL | 3 refills | Status: DC

## 2017-05-22 NOTE — Assessment & Plan Note (Signed)
Labs

## 2017-05-22 NOTE — Progress Notes (Signed)
Subjective:  Patient ID: Whitney [name], female    DOB: 1940-11-12  Age: 76 y.o. MRN: [id number]  CC: No chief complaint on file.   HPI Whitney [name] presents for a well exam. Husband is on HD w/PVD, DM  Outpatient Medications Prior to Visit  Medication Sig Dispense Refill  . acetaminophen (TYLENOL) 500 MG tablet Take 500 mg by mouth every 6 (six) hours as needed.      Marland Kitchen aspirin 81 MG tablet Take 81 mg by mouth daily.    . cholecalciferol (VITAMIN D) 1000 UNITS tablet Take 1 tablet (1,000 Units total) by mouth daily. 100 tablet 3  . Cyanocobalamin (VITAMIN B-12) 1000 MCG SUBL Place 1 tablet (1,000 mcg total) under the tongue daily. 100 tablet 3  . FLUoxetine (PROZAC) 10 MG capsule TAKE 1 CAPSULE (10 MG TOTAL) BY MOUTH DAILY. MUST KEEP APPT FOR FUTURE REILLS 30 capsule 5  . LORazepam (ATIVAN) 0.5 MG tablet Take 1 tablet (0.5 mg total) by mouth 2 (two) times daily as needed for anxiety. 30 tablet 0  . meclizine (ANTIVERT) 12.5 MG tablet Take 1 tablet (12.5 mg total) by mouth 3 (three) times daily as needed for dizziness. 60 tablet 0  . SYNTHROID 125 MCG tablet Take 1 tablet (125 mcg total) by mouth daily before breakfast. Must keep appt for future refills 30 tablet 0   No facility-administered medications prior to visit.     ROS Review of Systems  Constitutional: Negative for activity change, appetite change, chills, fatigue and unexpected weight change.  HENT: Negative for congestion, mouth sores and sinus pressure.   Eyes: Negative for visual disturbance.  Respiratory: Negative for cough and chest tightness.   Gastrointestinal: Negative for abdominal pain and nausea.  Genitourinary: Negative for difficulty urinating, frequency and vaginal pain.  Musculoskeletal: Negative for back pain and gait problem.  Skin: Negative for pallor and rash.  Neurological: Negative for dizziness, tremors, weakness, numbness and headaches.  Psychiatric/Behavioral: Negative for confusion and  sleep disturbance.    Objective:  BP (!) 144/72 (BP Location: Left Arm, Patient Position: Sitting, Cuff Size: Large)   Pulse (!) 57   Temp 97.7 F (36.5 C) (Oral)   Ht 5\' 3"  (1.6 m)   Wt 153 lb (69.4 kg)   SpO2 99%   BMI 27.10 kg/m   BP Readings from Last 3 Encounters:  05/22/17 (!) 144/72  03/01/16 124/60  12/17/14 130/70    Wt Readings from Last 3 Encounters:  05/22/17 153 lb (69.4 kg)  03/01/16 160 lb (72.6 kg)  12/17/14 163 lb 12 oz (74.3 kg)    Physical Exam  Constitutional: She appears well-developed. No distress.  HENT:  Head: Normocephalic.  Right Ear: External ear normal.  Left Ear: External ear normal.  Nose: Nose normal.  Mouth/Throat: Oropharynx is clear and moist.  Eyes: Pupils are equal, round, and reactive to light. Conjunctivae are normal. Right eye exhibits no discharge. Left eye exhibits no discharge.  Neck: Normal range of motion. Neck supple. No JVD present. No tracheal deviation present. No thyromegaly present.  Cardiovascular: Normal rate, regular rhythm and normal heart sounds.   Pulmonary/Chest: No stridor. No respiratory distress. She has no wheezes.  Abdominal: Soft. Bowel sounds are normal. She exhibits no distension and no mass. There is no tenderness. There is no rebound and no guarding.  Musculoskeletal: She exhibits no edema or tenderness.  Lymphadenopathy:    She has no cervical adenopathy.  Neurological: She displays normal reflexes. No cranial nerve  deficit. She exhibits normal muscle tone. Coordination normal.  Skin: No rash noted. No erythema.  Psychiatric: She has a normal mood and affect. Her behavior is normal. Judgment and thought content normal.    Lab Results  Component Value Date   WBC 9.3 03/01/2016   HGB 12.8 03/01/2016   HCT 38.4 03/01/2016   PLT 357.0 03/01/2016   GLUCOSE 120 (H) 03/01/2016   CHOL 183 03/01/2016   TRIG 205.0 (H) 03/01/2016   HDL 36.90 (L) 03/01/2016   LDLDIRECT 116.0 03/01/2016   LDLCALC 114 (H)  01/27/2007   ALT 15 03/01/2016   AST 13 03/01/2016   NA 138 03/01/2016   K 4.1 03/01/2016   CL 103 03/01/2016   CREATININE 0.78 03/01/2016   BUN 12 03/01/2016   CO2 29 03/01/2016   TSH 0.69 03/01/2016   HGBA1C 6.1 02/03/2010    Mm Screening Breast Tomo Bilateral  Result Date: 09/24/2016 CLINICAL DATA:  Screening. EXAM: 2D DIGITAL SCREENING BILATERAL MAMMOGRAM WITH CAD AND ADJUNCT TOMO COMPARISON:  Previous exam(s). ACR Breast Density Category b: There are scattered areas of fibroglandular density. FINDINGS: There are no findings suspicious for malignancy. Images were processed with CAD. IMPRESSION: No mammographic evidence of malignancy. A result letter of this screening mammogram will be mailed directly to the patient. RECOMMENDATION: Screening mammogram in one year. (Code:SM-B-01Y) BI-RADS CATEGORY  1: Negative. Electronically Signed   By: Lovey Newcomer M.D.   On: 09/24/2016 16:32    Assessment & Plan:   There are no diagnoses linked to this encounter. I am having Ms. Keelin maintain her acetaminophen, cholecalciferol, Vitamin B-12, aspirin, LORazepam, meclizine, SYNTHROID, and FLUoxetine.  No orders of the defined types were placed in this encounter.    Follow-up: No Follow-up on file.  Walker Kehr, MD

## 2017-05-22 NOTE — Assessment & Plan Note (Signed)
Labs On B12 

## 2017-05-22 NOTE — Assessment & Plan Note (Addendum)
Here for medicare wellness/physical  Diet: heart healthy  Physical activity: not sedentary  Depression/mood screen: negative  Hearing: intact to whispered voice  Visual acuity: grossly normal w/glasses, performs annual eye exam  ADLs: capable  Fall risk: low to none  Home safety: good  Cognitive evaluation: intact to orientation, naming, recall and repetition  EOL planning: adv directives, full code/ I agree  I have personally reviewed and have noted  1. The patient's medical, surgical and social history  2. Their use of alcohol, tobacco or illicit drugs  3. Their current medications and supplements  4. The patient's functional ability including ADL's, fall risks, home safety risks and hearing or visual impairment.  5. Diet and physical activities  6. Evidence for depression or mood disorders - stress w/ill husband 7. The roster of all physicians providing medical care to patient - is listed in the Snapshot section of the chart and reviewed today.    Today patient counseled on age appropriate routine health concerns for screening and prevention, each reviewed and up to date or declined. Immunizations reviewed and up to date or declined. Labs ordered and reviewed. Risk factors for depression reviewed and negative. Hearing function and visual acuity are intact. ADLs screened and addressed as needed. Functional ability and level of safety reviewed and appropriate. Education, counseling and referrals performed based on assessed risks today. Patient provided with a copy of personalized plan for preventive services.   Colon 2016 Dr Watt Climes

## 2017-05-22 NOTE — Assessment & Plan Note (Signed)
F/u w/Dr Magod 

## 2017-05-24 ENCOUNTER — Telehealth: Payer: Self-pay | Admitting: Internal Medicine

## 2017-05-24 NOTE — Telephone Encounter (Signed)
Pt called checking on the results from her labs. She said that Dr Alain Marion was potentially going to be changing her thyroid medication depending on the results. Please advise.

## 2017-05-29 MED ORDER — CIPROFLOXACIN HCL 250 MG PO TABS: 250.0000 mg | ORAL_TABLET | Freq: Two times a day (BID) | ORAL | 0 refills | Status: DC

## 2017-05-29 MED ORDER — LEVOTHYROXINE SODIUM 150 MCG PO TABS: 150.0000 ug | ORAL_TABLET | Freq: Every day | ORAL | 3 refills | Status: DC

## 2017-05-29 NOTE — Telephone Encounter (Signed)
I emailed via MyChart:   Dear Mrs Belmonte, Your labs/tests are good, except for a little low iron, bladder infection and a need to take a higher dose of the Levothroid. Please take Levothyroxine 150 mcg a day - Rx emailed. I'll email an antibiotic Rx. Take over-the counter iron. Your sugar was up a little. Sincerely, Walker Kehr, MD  Thx

## 2017-06-27 ENCOUNTER — Other Ambulatory Visit: Payer: Self-pay | Admitting: Internal Medicine

## 2017-08-14 DIAGNOSIS — Z7689 Persons encountering health services in other specified circumstances: Secondary | ICD-10-CM | POA: Diagnosis not present

## 2017-08-14 DIAGNOSIS — J0100 Acute maxillary sinusitis, unspecified: Secondary | ICD-10-CM | POA: Diagnosis not present

## 2017-08-14 DIAGNOSIS — Z85038 Personal history of other malignant neoplasm of large intestine: Secondary | ICD-10-CM | POA: Insufficient documentation

## 2017-08-14 DIAGNOSIS — E039 Hypothyroidism, unspecified: Secondary | ICD-10-CM | POA: Diagnosis not present

## 2017-10-01 DIAGNOSIS — J329 Chronic sinusitis, unspecified: Secondary | ICD-10-CM | POA: Diagnosis not present

## 2017-12-24 ENCOUNTER — Other Ambulatory Visit: Payer: Self-pay | Admitting: Internal Medicine

## 2017-12-24 DIAGNOSIS — Z1231 Encounter for screening mammogram for malignant neoplasm of breast: Secondary | ICD-10-CM

## 2018-01-15 ENCOUNTER — Ambulatory Visit
Admission: RE | Admit: 2018-01-15 | Discharge: 2018-01-15 | Disposition: A | Discharge status: Home / Self Care | Payer: Medicare Other | Source: Ambulatory Visit | Attending: Internal Medicine | Admitting: Internal Medicine

## 2018-01-15 DIAGNOSIS — Z1231 Encounter for screening mammogram for malignant neoplasm of breast: Secondary | ICD-10-CM

## 2018-02-04 DIAGNOSIS — S8002XA Contusion of left knee, initial encounter: Secondary | ICD-10-CM | POA: Diagnosis not present

## 2018-02-04 DIAGNOSIS — M25562 Pain in left knee: Secondary | ICD-10-CM | POA: Diagnosis not present

## 2018-02-04 DIAGNOSIS — M1712 Unilateral primary osteoarthritis, left knee: Secondary | ICD-10-CM | POA: Diagnosis not present

## 2018-02-08 DIAGNOSIS — S8002XA Contusion of left knee, initial encounter: Secondary | ICD-10-CM | POA: Insufficient documentation

## 2018-05-27 DIAGNOSIS — Z85038 Personal history of other malignant neoplasm of large intestine: Secondary | ICD-10-CM | POA: Diagnosis not present

## 2018-05-27 DIAGNOSIS — E038 Other specified hypothyroidism: Secondary | ICD-10-CM | POA: Diagnosis not present

## 2018-05-27 LAB — CBC AND DIFFERENTIAL
HCT: 38 (ref 36–46)
Hemoglobin: 12.6 (ref 12.0–16.0)
Platelets: 333 (ref 150–399)
WBC: 6.1

## 2018-05-27 LAB — HEPATIC FUNCTION PANEL
ALT: 15 (ref 7–35)
AST: 15 (ref 13–35)
Alkaline Phosphatase: 90 (ref 25–125)

## 2018-05-27 LAB — BASIC METABOLIC PANEL
BUN: 16 (ref 4–21)
Creatinine: 0.8 (ref 0.5–1.1)
GLUCOSE: 103
POTASSIUM: 4 (ref 3.4–5.3)
Sodium: 140 (ref 137–147)

## 2018-05-27 LAB — TSH: TSH: 2.84 (ref 0.41–5.90)

## 2018-06-03 ENCOUNTER — Encounter: Payer: Self-pay | Admitting: Internal Medicine

## 2018-06-28 ENCOUNTER — Other Ambulatory Visit: Payer: Self-pay | Admitting: Internal Medicine

## 2018-08-01 DIAGNOSIS — K219 Gastro-esophageal reflux disease without esophagitis: Secondary | ICD-10-CM | POA: Diagnosis not present

## 2018-08-01 DIAGNOSIS — K921 Melena: Secondary | ICD-10-CM | POA: Diagnosis not present

## 2018-08-27 DIAGNOSIS — K449 Diaphragmatic hernia without obstruction or gangrene: Secondary | ICD-10-CM | POA: Diagnosis not present

## 2018-08-27 DIAGNOSIS — K921 Melena: Secondary | ICD-10-CM | POA: Diagnosis not present

## 2018-08-27 DIAGNOSIS — K5730 Diverticulosis of large intestine without perforation or abscess without bleeding: Secondary | ICD-10-CM | POA: Diagnosis not present

## 2018-08-27 DIAGNOSIS — K648 Other hemorrhoids: Secondary | ICD-10-CM | POA: Diagnosis not present

## 2018-08-27 DIAGNOSIS — Z85038 Personal history of other malignant neoplasm of large intestine: Secondary | ICD-10-CM | POA: Diagnosis not present

## 2018-08-27 DIAGNOSIS — K269 Duodenal ulcer, unspecified as acute or chronic, without hemorrhage or perforation: Secondary | ICD-10-CM | POA: Diagnosis not present

## 2018-08-27 DIAGNOSIS — Z980 Intestinal bypass and anastomosis status: Secondary | ICD-10-CM | POA: Diagnosis not present

## 2018-08-27 LAB — HM COLONOSCOPY

## 2018-09-15 ENCOUNTER — Encounter: Payer: Self-pay | Admitting: Internal Medicine

## 2018-09-15 ENCOUNTER — Ambulatory Visit (INDEPENDENT_AMBULATORY_CARE_PROVIDER_SITE_OTHER): Payer: BLUE CROSS/BLUE SHIELD | Admitting: Internal Medicine

## 2018-09-15 DIAGNOSIS — Z8249 Family history of ischemic heart disease and other diseases of the circulatory system: Secondary | ICD-10-CM | POA: Diagnosis not present

## 2018-09-15 DIAGNOSIS — E034 Atrophy of thyroid (acquired): Secondary | ICD-10-CM | POA: Diagnosis not present

## 2018-09-15 DIAGNOSIS — E538 Deficiency of other specified B group vitamins: Secondary | ICD-10-CM

## 2018-09-15 DIAGNOSIS — J40 Bronchitis, not specified as acute or chronic: Secondary | ICD-10-CM

## 2018-09-15 DIAGNOSIS — F411 Generalized anxiety disorder: Secondary | ICD-10-CM

## 2018-09-15 MED ORDER — AZITHROMYCIN 250 MG PO TABS: ORAL_TABLET | ORAL | 0 refills | Status: DC

## 2018-09-15 MED ORDER — HYDROCODONE-HOMATROPINE 5-1.5 MG/5ML PO SYRP: 5.0000 mL | ORAL_SOLUTION | Freq: Four times a day (QID) | ORAL | 0 refills | Status: DC | PRN

## 2018-09-15 NOTE — Patient Instructions (Addendum)
You can use over-the-counter  "cold" medicines  such as  "Afrin" nasal spray for nasal congestion as directed. Use " Delsym" or" Robitussin" cough syrup varietis for cough.  You can use plain "Tylenol" or "Advil" for fever, chills and achyness. Use Halls or Ricola cough drops.     Please, make an appointment if you are not better or if you're worse.     Cardiac CT calcium scoring test $150   Computed tomography, more commonly known as a CT or CAT scan, is a diagnostic medical imaging test. Like traditional x-rays, it produces multiple images or pictures of the inside of the body. The cross-sectional images generated during a CT scan can be reformatted in multiple planes. They can even generate three-dimensional images. These images can be viewed on a computer monitor, printed on film or by a 3D printer, or transferred to a CD or DVD. CT images of internal organs, bones, soft tissue and blood vessels provide greater detail than traditional x-rays, particularly of soft tissues and blood vessels. A cardiac CT scan for coronary calcium is a non-invasive way of obtaining information about the presence, location and extent of calcified plaque in the coronary arteries-the vessels that supply oxygen-containing blood to the heart muscle. Calcified plaque results when there is a build-up of fat and other substances under the inner layer of the artery. This material can calcify which signals the presence of atherosclerosis, a disease of the vessel wall, also called coronary artery disease (CAD). People with this disease have an increased risk for heart attacks. In addition, over time, progression of plaque build up (CAD) can narrow the arteries or even close off blood flow to the heart. The result may be chest pain, sometimes called "angina," or a heart attack. Because calcium is a marker of CAD, the amount of calcium detected on a cardiac CT scan is a helpful prognostic tool. The findings on cardiac CT are  expressed as a calcium score. Another name for this test is coronary artery calcium scoring.  What are some common uses of the procedure? The goal of cardiac CT scan for calcium scoring is to determine if CAD is present and to what extent, even if there are no symptoms. It is a screening study that may be recommended by a physician for patients with risk factors for CAD but no clinical symptoms. The major risk factors for CAD are: . high blood cholesterol levels  . family history of heart attacks  . diabetes  . high blood pressure  . cigarette smoking  . overweight or obese  . physical inactivity   A negative cardiac CT scan for calcium scoring shows no calcification within the coronary arteries. This suggests that CAD is absent or so minimal it cannot be seen by this technique. The chance of having a heart attack over the next two to five years is very low under these circumstances. A positive test means that CAD is present, regardless of whether or not the patient is experiencing any symptoms. The amount of calcification-expressed as the calcium score-may help to predict the likelihood of a myocardial infarction (heart attack) in the coming years and helps your medical doctor or cardiologist decide whether the patient may need to take preventive medicine or undertake other measures such as diet and exercise to lower the risk for heart attack. The extent of CAD is graded according to your calcium score:  Calcium Score  Presence of CAD  0 No evidence of CAD   1-10 Minimal  evidence of CAD  11-100 Mild evidence of CAD  101-400 Moderate evidence of CAD  Over 400 Extensive evidence of CAD

## 2018-09-15 NOTE — Assessment & Plan Note (Signed)
On B12 

## 2018-09-15 NOTE — Assessment & Plan Note (Signed)
A cardiac CT scan for calcium scoring offered 

## 2018-09-15 NOTE — Assessment & Plan Note (Signed)
Stress at home discussed

## 2018-09-15 NOTE — Assessment & Plan Note (Signed)
Hycodan Zpac

## 2018-09-15 NOTE — Assessment & Plan Note (Signed)
On Levothroid 

## 2018-09-15 NOTE — Progress Notes (Signed)
Subjective:  Patient ID: Whitney [name], female    DOB: 1941-04-30  Age: 78 y.o. MRN: [id number]  CC: No chief complaint on file.   HPI KAMILA BRODA presents for URI sx's x 1 week. A lot of stress w/sick husband  Outpatient Medications Prior to Visit  Medication Sig Dispense Refill  . acetaminophen (TYLENOL) 500 MG tablet Take 500 mg by mouth every 6 (six) hours as needed.      Marland Kitchen aspirin 81 MG tablet Take 81 mg by mouth daily.    . cholecalciferol (VITAMIN D) 1000 UNITS tablet Take 1 tablet (1,000 Units total) by mouth daily. 100 tablet 3  . Cyanocobalamin (VITAMIN B-12) 1000 MCG SUBL Place 1 tablet (1,000 mcg total) under the tongue daily. 100 tablet 3  . FLUoxetine (PROZAC) 10 MG capsule TAKE 1 CAPSULE (10 MG TOTAL) BY MOUTH DAILY. MUST KEEP APPT FOR FUTURE REILLS 30 capsule 5  . Multiple Vitamins-Minerals (WOMENS MULTIVITAMIN PO) Take by mouth.    . SYNTHROID 125 MCG tablet TAKE 1 TABLET BY MOUTH ONCE DAILY 30 tablet 11  . ciprofloxacin (CIPRO) 250 MG tablet Take 1 tablet (250 mg total) 2 (two) times daily by mouth. 6 tablet 0  . levothyroxine (SYNTHROID, LEVOTHROID) 150 MCG tablet Take 1 tablet (150 mcg total) daily by mouth. 90 tablet 3  . LORazepam (ATIVAN) 0.5 MG tablet Take 1 tablet (0.5 mg total) by mouth 2 (two) times daily as needed for anxiety. 30 tablet 0  . meclizine (ANTIVERT) 12.5 MG tablet Take 1 tablet (12.5 mg total) by mouth 3 (three) times daily as needed for dizziness. 60 tablet 0   No facility-administered medications prior to visit.     ROS: Review of Systems  Constitutional: Negative for activity change, appetite change, chills, fatigue and unexpected weight change.  HENT: Positive for congestion, rhinorrhea, sinus pressure and sore throat. Negative for mouth sores.   Eyes: Negative for visual disturbance.  Respiratory: Positive for cough. Negative for chest tightness.   Gastrointestinal: Negative for abdominal pain and nausea.  Genitourinary:  Negative for difficulty urinating, frequency and vaginal pain.  Musculoskeletal: Positive for arthralgias. Negative for back pain and gait problem.  Skin: Negative for pallor and rash.  Neurological: Negative for dizziness, tremors, weakness, numbness and headaches.  Psychiatric/Behavioral: Negative for confusion and sleep disturbance.    Objective:  BP 132/78 (BP Location: Left Arm, Patient Position: Sitting, Cuff Size: Normal)   Pulse 70   Temp 98.1 F (36.7 C) (Oral)   Ht 5\' 3"  (1.6 m)   Wt 153 lb (69.4 kg)   SpO2 97%   BMI 27.10 kg/m   BP Readings from Last 3 Encounters:  09/15/18 132/78  05/22/17 (!) 144/72  03/01/16 124/60    Wt Readings from Last 3 Encounters:  09/15/18 153 lb (69.4 kg)  05/22/17 153 lb (69.4 kg)  03/01/16 160 lb (72.6 kg)    Physical Exam Constitutional:      General: She is not in acute distress.    Appearance: She is well-developed.  HENT:     Head: Normocephalic.     Right Ear: External ear normal.     Left Ear: External ear normal.     Nose: Nose normal.  Eyes:     General:        Right eye: No discharge.        Left eye: No discharge.     Conjunctiva/sclera: Conjunctivae normal.     Pupils: Pupils are equal, round, and  reactive to light.  Neck:     Musculoskeletal: Normal range of motion and neck supple.     Thyroid: No thyromegaly.     Vascular: No JVD.     Trachea: No tracheal deviation.  Cardiovascular:     Rate and Rhythm: Normal rate and regular rhythm.     Heart sounds: Normal heart sounds.  Pulmonary:     Effort: No respiratory distress.     Breath sounds: No stridor. No wheezing.  Abdominal:     General: Bowel sounds are normal. There is no distension.     Palpations: Abdomen is soft. There is no mass.     Tenderness: There is no abdominal tenderness. There is no guarding or rebound.  Musculoskeletal:        General: No tenderness.  Lymphadenopathy:     Cervical: No cervical adenopathy.  Skin:    Findings: No  erythema or rash.  Neurological:     Cranial Nerves: No cranial nerve deficit.     Motor: No abnormal muscle tone.     Coordination: Coordination normal.     Deep Tendon Reflexes: Reflexes normal.  Psychiatric:        Behavior: Behavior normal.        Thought Content: Thought content normal.        Judgment: Judgment normal.     Lab Results  Component Value Date   WBC 6.1 05/27/2018   HGB 12.6 05/27/2018   HCT 38 05/27/2018   PLT 333 05/27/2018   GLUCOSE 116 (H) 05/22/2017   CHOL 193 05/22/2017   TRIG 167.0 (H) 05/22/2017   HDL 41.10 05/22/2017   LDLDIRECT 116.0 03/01/2016   LDLCALC 119 (H) 05/22/2017   ALT 15 05/27/2018   AST 15 05/27/2018   NA 140 05/27/2018   K 4.0 05/27/2018   CL 104 05/22/2017   CREATININE 0.8 05/27/2018   BUN 16 05/27/2018   CO2 29 05/22/2017   TSH 2.84 05/27/2018   HGBA1C 6.1 02/03/2010    Mm 3d Screen Breast Bilateral  Result Date: 01/15/2018 CLINICAL DATA:  Screening. EXAM: DIGITAL SCREENING BILATERAL MAMMOGRAM WITH TOMO AND CAD COMPARISON:  Previous exam(s). ACR Breast Density Category b: There are scattered areas of fibroglandular density. FINDINGS: There are no findings suspicious for malignancy. Images were processed with CAD. IMPRESSION: No mammographic evidence of malignancy. A result letter of this screening mammogram will be mailed directly to the patient. RECOMMENDATION: Screening mammogram in one year. (Code:SM-B-01Y) BI-RADS CATEGORY  1: Negative. Electronically Signed   By: Claudie Revering M.D.   On: 01/15/2018 16:01    Assessment & Plan:   There are no diagnoses linked to this encounter.   No orders of the defined types were placed in this encounter.    Follow-up: No follow-ups on file.  Walker Kehr, MD

## 2018-09-15 NOTE — Assessment & Plan Note (Signed)
Stress at home

## 2018-09-18 ENCOUNTER — Encounter: Payer: Self-pay | Admitting: Internal Medicine

## 2018-09-18 NOTE — Progress Notes (Signed)
Outside notes received. Information abstracted. Notes sent to scan.  

## 2018-12-30 ENCOUNTER — Other Ambulatory Visit: Payer: Self-pay | Admitting: Internal Medicine

## 2018-12-30 DIAGNOSIS — Z1231 Encounter for screening mammogram for malignant neoplasm of breast: Secondary | ICD-10-CM

## 2019-01-19 ENCOUNTER — Other Ambulatory Visit: Payer: Self-pay

## 2019-01-19 ENCOUNTER — Ambulatory Visit
Admission: RE | Admit: 2019-01-19 | Discharge: 2019-01-19 | Disposition: A | Discharge status: Home / Self Care | Payer: BC Managed Care – PPO | Source: Ambulatory Visit | Attending: Internal Medicine | Admitting: Internal Medicine

## 2019-01-19 DIAGNOSIS — Z1231 Encounter for screening mammogram for malignant neoplasm of breast: Secondary | ICD-10-CM

## 2019-04-20 DIAGNOSIS — E039 Hypothyroidism, unspecified: Secondary | ICD-10-CM | POA: Diagnosis not present

## 2019-04-20 DIAGNOSIS — R05 Cough: Secondary | ICD-10-CM | POA: Diagnosis not present

## 2019-04-20 DIAGNOSIS — J069 Acute upper respiratory infection, unspecified: Secondary | ICD-10-CM | POA: Diagnosis not present

## 2019-04-22 DIAGNOSIS — R7309 Other abnormal glucose: Secondary | ICD-10-CM | POA: Diagnosis not present

## 2019-04-22 DIAGNOSIS — E039 Hypothyroidism, unspecified: Secondary | ICD-10-CM | POA: Diagnosis not present

## 2019-04-22 DIAGNOSIS — E538 Deficiency of other specified B group vitamins: Secondary | ICD-10-CM | POA: Diagnosis not present

## 2019-04-22 DIAGNOSIS — Z79899 Other long term (current) drug therapy: Secondary | ICD-10-CM | POA: Diagnosis not present

## 2019-04-22 DIAGNOSIS — Z8249 Family history of ischemic heart disease and other diseases of the circulatory system: Secondary | ICD-10-CM | POA: Diagnosis not present

## 2019-04-22 DIAGNOSIS — Z79890 Hormone replacement therapy (postmenopausal): Secondary | ICD-10-CM | POA: Diagnosis not present

## 2019-07-16 ENCOUNTER — Other Ambulatory Visit: Payer: Self-pay | Admitting: Internal Medicine

## 2019-07-21 ENCOUNTER — Other Ambulatory Visit: Payer: Self-pay

## 2019-07-21 ENCOUNTER — Encounter: Payer: Self-pay | Admitting: Internal Medicine

## 2019-07-21 ENCOUNTER — Ambulatory Visit (INDEPENDENT_AMBULATORY_CARE_PROVIDER_SITE_OTHER): Payer: BC Managed Care – PPO | Admitting: Internal Medicine

## 2019-07-21 VITALS — BP 120/82 | HR 75 | Ht 63.0 in | Wt 149.0 lb

## 2019-07-21 DIAGNOSIS — D500 Iron deficiency anemia secondary to blood loss (chronic): Secondary | ICD-10-CM | POA: Diagnosis not present

## 2019-07-21 DIAGNOSIS — E538 Deficiency of other specified B group vitamins: Secondary | ICD-10-CM

## 2019-07-21 DIAGNOSIS — Z8249 Family history of ischemic heart disease and other diseases of the circulatory system: Secondary | ICD-10-CM

## 2019-07-21 DIAGNOSIS — E034 Atrophy of thyroid (acquired): Secondary | ICD-10-CM | POA: Diagnosis not present

## 2019-07-21 DIAGNOSIS — F4320 Adjustment disorder, unspecified: Secondary | ICD-10-CM | POA: Insufficient documentation

## 2019-07-21 DIAGNOSIS — Z0000 Encounter for general adult medical examination without abnormal findings: Secondary | ICD-10-CM

## 2019-07-21 DIAGNOSIS — E559 Vitamin D deficiency, unspecified: Secondary | ICD-10-CM

## 2019-07-21 DIAGNOSIS — F4321 Adjustment disorder with depressed mood: Secondary | ICD-10-CM | POA: Diagnosis not present

## 2019-07-21 MED ORDER — SYNTHROID 125 MCG PO TABS: ORAL_TABLET | ORAL | 3 refills | Status: DC

## 2019-07-21 MED ORDER — FLUOXETINE HCL 10 MG PO CAPS: 10.0000 mg | ORAL_CAPSULE | Freq: Every day | ORAL | 3 refills | Status: DC

## 2019-07-21 NOTE — Assessment & Plan Note (Signed)
Chronic Risks associated with treatment noncompliance were discussed. Compliance was encouraged.

## 2019-07-21 NOTE — Assessment & Plan Note (Signed)
On Levothroid 

## 2019-07-21 NOTE — Patient Instructions (Signed)

## 2019-07-21 NOTE — Assessment & Plan Note (Signed)
CBC/iron 

## 2019-07-21 NOTE — Progress Notes (Signed)
Subjective:  Patient ID: Whitney [name], female    DOB: [birth year]  Age: 78 y.o. MRN: [id number]  CC: No chief complaint on file.   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 acetaminophen (TYLENOL) 500 MG tablet Take 500 mg by mouth every 6 (six) hours as needed.      Marland Kitchen aspirin 81 MG tablet Take 81 mg by mouth daily.    . cholecalciferol (VITAMIN D) 1000 UNITS tablet Take 1 tablet (1,000 Units total) by mouth daily. 100 tablet 3  . Cyanocobalamin (VITAMIN B-12) 1000 MCG SUBL Place 1 tablet (1,000 mcg total) under the tongue daily. 100 tablet 3  . FLUoxetine (PROZAC) 10 MG capsule TAKE 1 CAPSULE (10 MG TOTAL) BY MOUTH DAILY. MUST KEEP APPT FOR FUTURE REILLS 30 capsule 5  . Multiple Vitamins-Minerals (WOMENS MULTIVITAMIN PO) Take by mouth.    . SYNTHROID 125 MCG tablet TAKE 1 TABLET BY MOUTH ONCE DAILY 30 tablet 11  . azithromycin (ZITHROMAX Z-PAK) 250 MG tablet As directed (Patient not taking: Reported on 07/21/2019) 6 tablet 0  . HYDROcodone-homatropine (HYCODAN) 5-1.5 MG/5ML syrup Take 5 mLs by mouth every 6 (six) hours as needed for cough. (Patient not taking: Reported on 07/21/2019) 240 mL 0   No facility-administered medications prior to visit.    ROS: Review of Systems  Constitutional: Negative for activity change, appetite change, chills, fatigue and unexpected weight change.  HENT: Negative for congestion, mouth sores and sinus pressure.   Eyes: Negative for visual disturbance.  Respiratory: Negative for cough and chest tightness.   Gastrointestinal: Negative for abdominal pain and nausea.  Genitourinary: Negative for difficulty urinating, frequency and vaginal pain.  Musculoskeletal: Negative for back pain and gait problem.  Skin: Negative for pallor and rash.  Neurological: Negative for dizziness, tremors, weakness, numbness and headaches.    Psychiatric/Behavioral: Positive for dysphoric mood. Negative for confusion and sleep disturbance. The patient is nervous/anxious.     Objective:  BP 120/82 (BP Location: Left Arm, Patient Position: Sitting, Cuff Size: Large)   Pulse 75   Ht 5\' 3"  (1.6 m)   Wt 149 lb (67.6 kg)   SpO2 98%   BMI 26.39 kg/m   BP Readings from Last 3 Encounters:  07/21/19 120/82  09/15/18 132/78  05/22/17 (!) 144/72    Wt Readings from Last 3 Encounters:  07/21/19 149 lb (67.6 kg)  09/15/18 153 lb (69.4 kg)  05/22/17 153 lb (69.4 kg)    Physical Exam Constitutional:      General: She is not in acute distress.    Appearance: She is well-developed.  HENT:     Head: Normocephalic.     Right Ear: External ear normal.     Left Ear: External ear normal.     Nose: Nose normal.  Eyes:     General:        Right eye: No discharge.        Left eye: No discharge.     Conjunctiva/sclera: Conjunctivae normal.     Pupils: Pupils are equal, round, and reactive to light.  Neck:     Thyroid: No thyromegaly.     Vascular: No JVD.     Trachea: No tracheal deviation.  Cardiovascular:     Rate and Rhythm: Normal rate and regular rhythm.     Heart sounds: Normal heart sounds.  Pulmonary:     Effort: No respiratory  distress.     Breath sounds: No stridor. No wheezing.  Abdominal:     General: Bowel sounds are normal. There is no distension.     Palpations: Abdomen is soft. There is no mass.     Tenderness: There is no abdominal tenderness. There is no guarding or rebound.  Musculoskeletal:        General: No tenderness.     Cervical back: Normal range of motion and neck supple.  Lymphadenopathy:     Cervical: No cervical adenopathy.  Skin:    Findings: No erythema or rash.  Neurological:     Mental Status: She is oriented to person, place, and time.     Cranial Nerves: No cranial nerve deficit.     Motor: No abnormal muscle tone.     Coordination: Coordination normal.     Deep Tendon Reflexes:  Reflexes normal.  Psychiatric:        Behavior: Behavior normal.        Thought Content: Thought content normal.        Judgment: Judgment normal.   sad  Lab Results  Component Value Date   WBC 6.1 05/27/2018   HGB 12.6 05/27/2018   HCT 38 05/27/2018   PLT 333 05/27/2018   GLUCOSE 116 (H) 05/22/2017   CHOL 193 05/22/2017   TRIG 167.0 (H) 05/22/2017   HDL 41.10 05/22/2017   LDLDIRECT 116.0 03/01/2016   LDLCALC 119 (H) 05/22/2017   ALT 15 05/27/2018   AST 15 05/27/2018   NA 140 05/27/2018   K 4.0 05/27/2018   CL 104 05/22/2017   CREATININE 0.8 05/27/2018   BUN 16 05/27/2018   CO2 29 05/22/2017   TSH 2.84 05/27/2018   HGBA1C 6.1 02/03/2010    MM 3D SCREEN BREAST BILATERAL  Result Date: 01/20/2019 CLINICAL DATA:  Screening. EXAM: DIGITAL SCREENING BILATERAL MAMMOGRAM WITH TOMO AND CAD COMPARISON:  Previous exam(s). ACR Breast Density Category b: There are scattered areas of fibroglandular density. FINDINGS: There are no findings suspicious for malignancy. Images were processed with CAD. IMPRESSION: No mammographic evidence of malignancy. A result letter of this screening mammogram will be mailed directly to the patient. RECOMMENDATION: Screening mammogram in one year. (Code:SM-B-01Y) BI-RADS CATEGORY  1: Negative. Electronically Signed   By: Everlean Alstrom M.D.   On: 01/20/2019 11:42    Assessment & Plan:   There are no diagnoses linked to this encounter.   No orders of the defined types were placed in this encounter.    Follow-up: No follow-ups on file.  Walker Kehr, MD

## 2019-07-21 NOTE — Assessment & Plan Note (Signed)
A cardiac CT scan for calcium scoring offered 

## 2019-07-21 NOTE — Assessment & Plan Note (Signed)
Husband died 12/20. Working Nurse, learning disability. Husband was on HD w/PVD, DM Prozac Discussed

## 2019-07-22 ENCOUNTER — Other Ambulatory Visit (INDEPENDENT_AMBULATORY_CARE_PROVIDER_SITE_OTHER): Payer: BC Managed Care – PPO

## 2019-07-22 DIAGNOSIS — Z0000 Encounter for general adult medical examination without abnormal findings: Secondary | ICD-10-CM

## 2019-07-22 DIAGNOSIS — D500 Iron deficiency anemia secondary to blood loss (chronic): Secondary | ICD-10-CM

## 2019-07-22 DIAGNOSIS — E559 Vitamin D deficiency, unspecified: Secondary | ICD-10-CM

## 2019-07-22 DIAGNOSIS — E538 Deficiency of other specified B group vitamins: Secondary | ICD-10-CM

## 2019-07-22 LAB — CBC WITH DIFFERENTIAL/PLATELET
Basophils Absolute: 0.1 10*3/uL (ref 0.0–0.1)
Basophils Relative: 0.8 % (ref 0.0–3.0)
Eosinophils Absolute: 0.2 10*3/uL (ref 0.0–0.7)
Eosinophils Relative: 2.6 % (ref 0.0–5.0)
HCT: 35.9 % — ABNORMAL LOW (ref 36.0–46.0)
Hemoglobin: 11.8 g/dL — ABNORMAL LOW (ref 12.0–15.0)
Lymphocytes Relative: 25.1 % (ref 12.0–46.0)
Lymphs Abs: 1.7 10*3/uL (ref 0.7–4.0)
MCHC: 32.9 g/dL (ref 30.0–36.0)
MCV: 87.8 fl (ref 78.0–100.0)
Monocytes Absolute: 0.5 10*3/uL (ref 0.1–1.0)
Monocytes Relative: 7.4 % (ref 3.0–12.0)
Neutro Abs: 4.4 10*3/uL (ref 1.4–7.7)
Neutrophils Relative %: 64.1 % (ref 43.0–77.0)
Platelets: 339 10*3/uL (ref 150.0–400.0)
RBC: 4.09 Mil/uL (ref 3.87–5.11)
RDW: 13.7 % (ref 11.5–15.5)
WBC: 6.9 10*3/uL (ref 4.0–10.5)

## 2019-07-22 LAB — HEPATIC FUNCTION PANEL
ALT: 16 U/L (ref 0–35)
AST: 16 U/L (ref 0–37)
Albumin: 4 g/dL (ref 3.5–5.2)
Alkaline Phosphatase: 88 U/L (ref 39–117)
Bilirubin, Direct: 0.1 mg/dL (ref 0.0–0.3)
Total Bilirubin: 0.5 mg/dL (ref 0.2–1.2)
Total Protein: 6.8 g/dL (ref 6.0–8.3)

## 2019-07-22 LAB — LIPID PANEL
Cholesterol: 192 mg/dL (ref 0–200)
HDL: 40.3 mg/dL (ref >39.00)
LDL Cholesterol: 120 mg/dL — ABNORMAL HIGH (ref 0–99)
NonHDL: 151.9
Total CHOL/HDL Ratio: 5
Triglycerides: 159 mg/dL — ABNORMAL HIGH (ref 0.0–149.0)
VLDL: 31.8 mg/dL (ref 0.0–40.0)

## 2019-07-22 LAB — BASIC METABOLIC PANEL
BUN: 15 mg/dL (ref 6–23)
CO2: 28 mEq/L (ref 19–32)
Calcium: 9.3 mg/dL (ref 8.4–10.5)
Chloride: 105 mEq/L (ref 96–112)
Creatinine, Ser: 0.75 mg/dL (ref 0.40–1.20)
GFR: 74.93 mL/min (ref >60.00)
Glucose, Bld: 121 mg/dL — ABNORMAL HIGH (ref 70–99)
Potassium: 4.2 mEq/L (ref 3.5–5.1)
Sodium: 138 mEq/L (ref 135–145)

## 2019-07-22 LAB — URINALYSIS, ROUTINE W REFLEX MICROSCOPIC
Bilirubin Urine: NEGATIVE
Hgb urine dipstick: NEGATIVE
Ketones, ur: NEGATIVE
Nitrite: NEGATIVE
RBC / HPF: NONE SEEN (ref 0–?)
Specific Gravity, Urine: 1.025 (ref 1.000–1.030)
Total Protein, Urine: NEGATIVE
Urine Glucose: NEGATIVE
Urobilinogen, UA: 0.2 (ref 0.0–1.0)
pH: 5.5 (ref 5.0–8.0)

## 2019-07-22 LAB — VITAMIN D 25 HYDROXY (VIT D DEFICIENCY, FRACTURES): VITD: 20.23 ng/mL — ABNORMAL LOW (ref 30.00–100.00)

## 2019-07-22 LAB — VITAMIN B12: Vitamin B-12: 1267 pg/mL — ABNORMAL HIGH (ref 211–911)

## 2019-07-22 LAB — TSH: TSH: 1.28 u[IU]/mL (ref 0.35–4.50)

## 2019-07-23 LAB — IRON,TIBC AND FERRITIN PANEL
%SAT: 24 % (calc) (ref 16–45)
Ferritin: 76 ng/mL (ref 16–288)
Iron: 48 ug/dL (ref 45–160)
TIBC: 202 mcg/dL (calc) — ABNORMAL LOW (ref 250–450)

## 2019-07-25 ENCOUNTER — Other Ambulatory Visit: Payer: Self-pay | Admitting: Internal Medicine

## 2019-07-25 MED ORDER — VITAMIN D3 1.25 MG (50000 UT) PO CAPS: 1.0000 | ORAL_CAPSULE | ORAL | 0 refills | Status: DC

## 2019-07-25 MED ORDER — VITAMIN D3 50 MCG (2000 UT) PO CAPS: 2000.0000 [IU] | ORAL_CAPSULE | Freq: Every day | ORAL | 3 refills | Status: AC

## 2019-09-17 ENCOUNTER — Other Ambulatory Visit: Payer: Self-pay | Admitting: Internal Medicine

## 2019-11-23 ENCOUNTER — Telehealth: Payer: Self-pay

## 2019-11-23 ENCOUNTER — Encounter: Payer: Self-pay | Admitting: Internal Medicine

## 2019-11-23 ENCOUNTER — Ambulatory Visit (INDEPENDENT_AMBULATORY_CARE_PROVIDER_SITE_OTHER): Payer: BC Managed Care – PPO | Admitting: Internal Medicine

## 2019-11-23 ENCOUNTER — Other Ambulatory Visit: Payer: Self-pay

## 2019-11-23 VITALS — BP 156/64 | HR 60 | Temp 98.4°F | Ht 63.0 in | Wt 150.0 lb

## 2019-11-23 DIAGNOSIS — R42 Dizziness and giddiness: Secondary | ICD-10-CM

## 2019-11-23 DIAGNOSIS — E785 Hyperlipidemia, unspecified: Secondary | ICD-10-CM | POA: Diagnosis not present

## 2019-11-23 DIAGNOSIS — R030 Elevated blood-pressure reading, without diagnosis of hypertension: Secondary | ICD-10-CM | POA: Diagnosis not present

## 2019-11-23 DIAGNOSIS — E538 Deficiency of other specified B group vitamins: Secondary | ICD-10-CM

## 2019-11-23 MED ORDER — LORATADINE 10 MG PO TABS: 10.0000 mg | ORAL_TABLET | Freq: Every day | ORAL | 11 refills | Status: DC

## 2019-11-23 MED ORDER — MECLIZINE HCL 12.5 MG PO TABS: 12.5000 mg | ORAL_TABLET | Freq: Three times a day (TID) | ORAL | 1 refills | Status: AC | PRN

## 2019-11-23 NOTE — Patient Instructions (Addendum)
Benign Positional Vertigo symptoms on the left. Start Meclizine. Start Brandt - Daroff exercise several times a day as dirrected. 

## 2019-11-23 NOTE — Assessment & Plan Note (Signed)
  BP nl at home 

## 2019-11-23 NOTE — Assessment & Plan Note (Signed)
On B12 

## 2019-11-23 NOTE — Progress Notes (Signed)
Subjective:  Patient ID: Whitney [name], female    DOB: 1941-01-28  Age: 79 y.o. MRN: [id number]  CC: No chief complaint on file.   HPI DARI MARTORELLA presents for vertigo, lightheadedness x 1 week In the morning you open your eyes are you dizzy or other you start to spend you around your past - spinning sensation. No LOC.  H/o vertigo.   BP never been high C/o allergies   Outpatient Medications Prior to Visit  Medication Sig Dispense Refill  . acetaminophen (TYLENOL) 500 MG tablet Take 500 mg by mouth every 6 (six) hours as needed.      Marland Kitchen aspirin 81 MG tablet Take 81 mg by mouth daily.    . Cholecalciferol (VITAMIN D3) 50 MCG (2000 UT) capsule Take 1 capsule (2,000 Units total) by mouth daily. 100 capsule 3  . Cyanocobalamin (VITAMIN B-12) 1000 MCG SUBL Place 1 tablet (1,000 mcg total) under the tongue daily. 100 tablet 3  . FLUoxetine (PROZAC) 10 MG capsule Take 1 capsule (10 mg total) by mouth daily. Must keep appt for future reills 90 capsule 3  . Multiple Vitamins-Minerals (WOMENS MULTIVITAMIN PO) Take by mouth.    . SYNTHROID 125 MCG tablet TAKE 1 TABLET BY MOUTH ONCE DAILY 90 tablet 3  . Cholecalciferol (VITAMIN D3) 1.25 MG (50000 UT) CAPS Take 1 capsule by mouth once a week. (Patient not taking: Reported on 11/23/2019) 6 capsule 0   No facility-administered medications prior to visit.    ROS: Review of Systems  Constitutional: Negative for activity change, appetite change, chills, fatigue and unexpected weight change.  HENT: Positive for congestion, postnasal drip and rhinorrhea. Negative for mouth sores and sinus pressure.   Eyes: Negative for visual disturbance.  Respiratory: Negative for cough and chest tightness.   Gastrointestinal: Negative for abdominal pain and nausea.  Genitourinary: Negative for difficulty urinating, frequency and vaginal pain.  Musculoskeletal: Negative for back pain and gait problem.  Skin: Negative for pallor and rash.    Neurological: Positive for dizziness and light-headedness. Negative for tremors, weakness, numbness and headaches.  Psychiatric/Behavioral: Negative for confusion and sleep disturbance.    Objective:  BP (!) 156/64 (BP Location: Left Arm, Patient Position: Standing, Cuff Size: Normal)   Pulse 60   Temp 98.4 F (36.9 C) (Oral)   Ht 5\' 3"  (1.6 m)   Wt 150 lb (68 kg)   SpO2 97%   BMI 26.57 kg/m   BP Readings from Last 3 Encounters:  11/23/19 (!) 156/64  07/21/19 120/82  09/15/18 132/78    Wt Readings from Last 3 Encounters:  11/23/19 150 lb (68 kg)  07/21/19 149 lb (67.6 kg)  09/15/18 153 lb (69.4 kg)    Physical Exam Constitutional:      General: She is not in acute distress.    Appearance: She is well-developed.  HENT:     Head: Normocephalic.     Right Ear: External ear normal.     Left Ear: External ear normal.     Nose: Nose normal.  Eyes:     General:        Right eye: No discharge.        Left eye: No discharge.     Conjunctiva/sclera: Conjunctivae normal.     Pupils: Pupils are equal, round, and reactive to light.  Neck:     Thyroid: No thyromegaly.     Vascular: No JVD.     Trachea: No tracheal deviation.  Cardiovascular:  Rate and Rhythm: Normal rate and regular rhythm.     Heart sounds: Normal heart sounds.  Pulmonary:     Effort: No respiratory distress.     Breath sounds: No stridor. No wheezing.  Abdominal:     General: Bowel sounds are normal. There is no distension.     Palpations: Abdomen is soft. There is no mass.     Tenderness: There is no abdominal tenderness. There is no guarding or rebound.  Musculoskeletal:        General: No tenderness.     Cervical back: Normal range of motion and neck supple.  Lymphadenopathy:     Cervical: No cervical adenopathy.  Skin:    Findings: No erythema or rash.  Neurological:     Cranial Nerves: No cranial nerve deficit.     Motor: No abnormal muscle tone.     Coordination: Coordination normal.      Deep Tendon Reflexes: Reflexes normal.  Psychiatric:        Behavior: Behavior normal.        Thought Content: Thought content normal.        Judgment: Judgment normal.    H-P (+) on the L  Lab Results  Component Value Date   WBC 6.9 07/22/2019   HGB 11.8 (L) 07/22/2019   HCT 35.9 (L) 07/22/2019   PLT 339.0 07/22/2019   GLUCOSE 121 (H) 07/22/2019   CHOL 192 07/22/2019   TRIG 159.0 (H) 07/22/2019   HDL 40.30 07/22/2019   LDLDIRECT 116.0 03/01/2016   LDLCALC 120 (H) 07/22/2019   ALT 16 07/22/2019   AST 16 07/22/2019   NA 138 07/22/2019   K 4.2 07/22/2019   CL 105 07/22/2019   CREATININE 0.75 07/22/2019   BUN 15 07/22/2019   CO2 28 07/22/2019   TSH 1.28 07/22/2019   HGBA1C 6.1 02/03/2010    MM 3D SCREEN BREAST BILATERAL  Result Date: 01/20/2019 CLINICAL DATA:  Screening. EXAM: DIGITAL SCREENING BILATERAL MAMMOGRAM WITH TOMO AND CAD COMPARISON:  Previous exam(s). ACR Breast Density Category b: There are scattered areas of fibroglandular density. FINDINGS: There are no findings suspicious for malignancy. Images were processed with CAD. IMPRESSION: No mammographic evidence of malignancy. A result letter of this screening mammogram will be mailed directly to the patient. RECOMMENDATION: Screening mammogram in one year. (Code:SM-B-01Y) BI-RADS CATEGORY  1: Negative. Electronically Signed   By: Everlean Alstrom M.D.   On: 01/20/2019 11:42    Assessment & Plan:   There are no diagnoses linked to this encounter.   No orders of the defined types were placed in this encounter.    Follow-up: No follow-ups on file.  Walker Kehr, MD

## 2019-11-23 NOTE — Telephone Encounter (Signed)
New message   Seen today   Calling back to update meds list every day   aspirin 81 MG tablet Cholecalciferol (VITAMIN D3) 50 MCG (2000 UT) capsule Cyanocobalamin (VITAMIN B-12) 1000 MCG SUBL FLUoxetine (PROZAC) 10 MG capsule as prn  SYNTHROID 125 MCG tablet loratadine (CLARITIN) 10 MG tablet- new  meclizine (ANTIVERT) 12.5 MG tablet - new  Over the counter -bitafusion two daily  Blood sugar syngy

## 2019-11-23 NOTE — Assessment & Plan Note (Signed)
Benign Positional Vertigo symptoms on the left. Start Meclizine. Start Brandt - Daroff exercise several times a day as dirrected. 

## 2019-11-23 NOTE — Telephone Encounter (Signed)
RXs updated

## 2019-11-30 ENCOUNTER — Ambulatory Visit (INDEPENDENT_AMBULATORY_CARE_PROVIDER_SITE_OTHER)
Admission: RE | Admit: 2019-11-30 | Discharge: 2019-11-30 | Disposition: A | Discharge status: Home / Self Care | Payer: Self-pay | Source: Ambulatory Visit | Attending: Internal Medicine | Admitting: Internal Medicine

## 2019-11-30 ENCOUNTER — Other Ambulatory Visit: Payer: Self-pay

## 2019-11-30 DIAGNOSIS — I700 Atherosclerosis of aorta: Secondary | ICD-10-CM | POA: Diagnosis not present

## 2019-11-30 DIAGNOSIS — E785 Hyperlipidemia, unspecified: Secondary | ICD-10-CM

## 2019-12-01 ENCOUNTER — Encounter: Payer: Self-pay | Admitting: Internal Medicine

## 2019-12-01 DIAGNOSIS — I2510 Atherosclerotic heart disease of native coronary artery without angina pectoris: Secondary | ICD-10-CM | POA: Insufficient documentation

## 2019-12-02 ENCOUNTER — Telehealth: Payer: Self-pay

## 2019-12-02 NOTE — Telephone Encounter (Signed)
LMTCB

## 2019-12-02 NOTE — Telephone Encounter (Signed)
New message    Calling for CT test results.

## 2019-12-03 NOTE — Telephone Encounter (Signed)
Pt notified and agreeded with the the Lipitor or Crestor and would like medication sent to [facility]

## 2019-12-03 NOTE — Telephone Encounter (Signed)
LMTCB  When pt returns call please transfer call

## 2019-12-03 NOTE — Telephone Encounter (Signed)
   Please return call to patient to discuss results of CT

## 2019-12-03 NOTE — Telephone Encounter (Signed)
   F/u  Patient returning call back to the [facility].

## 2019-12-03 NOTE — Telephone Encounter (Signed)
° ° °  Please return call to patient °

## 2019-12-04 ENCOUNTER — Other Ambulatory Visit: Payer: Self-pay | Admitting: Internal Medicine

## 2019-12-04 DIAGNOSIS — Z1231 Encounter for screening mammogram for malignant neoplasm of breast: Secondary | ICD-10-CM

## 2019-12-05 MED ORDER — ROSUVASTATIN CALCIUM 5 MG PO TABS
5.0000 mg | ORAL_TABLET | Freq: Every day | ORAL | 3 refills | Status: DC
Start: 2019-12-05 — End: 2020-11-21

## 2019-12-05 NOTE — Telephone Encounter (Signed)
Crestor prescription was emailed.  Follow-up with me in 3 months with LFTs, lipids checked prior.  Thanks

## 2020-01-20 ENCOUNTER — Ambulatory Visit
Admission: RE | Admit: 2020-01-20 | Discharge: 2020-01-20 | Disposition: A | Discharge status: Home / Self Care | Payer: Medicare Other | Source: Ambulatory Visit | Attending: Internal Medicine | Admitting: Internal Medicine

## 2020-01-20 ENCOUNTER — Other Ambulatory Visit: Payer: Self-pay

## 2020-01-20 DIAGNOSIS — Z1231 Encounter for screening mammogram for malignant neoplasm of breast: Secondary | ICD-10-CM | POA: Diagnosis not present

## 2020-01-26 ENCOUNTER — Other Ambulatory Visit: Payer: Self-pay | Admitting: Internal Medicine

## 2020-01-26 DIAGNOSIS — R928 Other abnormal and inconclusive findings on diagnostic imaging of breast: Secondary | ICD-10-CM

## 2020-02-08 ENCOUNTER — Ambulatory Visit: Payer: Medicare Other

## 2020-02-08 ENCOUNTER — Other Ambulatory Visit: Payer: Self-pay

## 2020-02-08 ENCOUNTER — Ambulatory Visit
Admission: RE | Admit: 2020-02-08 | Discharge: 2020-02-08 | Disposition: A | Discharge status: Home / Self Care | Payer: Medicare Other | Source: Ambulatory Visit | Attending: Internal Medicine | Admitting: Internal Medicine

## 2020-02-08 DIAGNOSIS — R928 Other abnormal and inconclusive findings on diagnostic imaging of breast: Secondary | ICD-10-CM

## 2020-03-26 ENCOUNTER — Emergency Department (HOSPITAL_BASED_OUTPATIENT_CLINIC_OR_DEPARTMENT_OTHER)
Admission: EM | Admit: 2020-03-26 | Discharge: 2020-03-26 | Disposition: A | Discharge status: Home / Self Care | Payer: Medicare Other | Attending: Emergency Medicine | Admitting: Emergency Medicine

## 2020-03-26 ENCOUNTER — Emergency Department (HOSPITAL_BASED_OUTPATIENT_CLINIC_OR_DEPARTMENT_OTHER): Payer: Medicare Other

## 2020-03-26 ENCOUNTER — Encounter (HOSPITAL_BASED_OUTPATIENT_CLINIC_OR_DEPARTMENT_OTHER): Payer: Self-pay | Admitting: Emergency Medicine

## 2020-03-26 DIAGNOSIS — S62626A Displaced fracture of medial phalanx of right little finger, initial encounter for closed fracture: Secondary | ICD-10-CM

## 2020-03-26 DIAGNOSIS — R030 Elevated blood-pressure reading, without diagnosis of hypertension: Secondary | ICD-10-CM | POA: Diagnosis not present

## 2020-03-26 DIAGNOSIS — S80211A Abrasion, right knee, initial encounter: Secondary | ICD-10-CM

## 2020-03-26 DIAGNOSIS — Z87891 Personal history of nicotine dependence: Secondary | ICD-10-CM | POA: Insufficient documentation

## 2020-03-26 DIAGNOSIS — Y939 Activity, unspecified: Secondary | ICD-10-CM | POA: Insufficient documentation

## 2020-03-26 DIAGNOSIS — W108XXA Fall (on) (from) other stairs and steps, initial encounter: Secondary | ICD-10-CM | POA: Insufficient documentation

## 2020-03-26 DIAGNOSIS — Z7982 Long term (current) use of aspirin: Secondary | ICD-10-CM | POA: Diagnosis not present

## 2020-03-26 DIAGNOSIS — Y929 Unspecified place or not applicable: Secondary | ICD-10-CM | POA: Diagnosis not present

## 2020-03-26 DIAGNOSIS — Y999 Unspecified external cause status: Secondary | ICD-10-CM | POA: Diagnosis not present

## 2020-03-26 DIAGNOSIS — Z79890 Hormone replacement therapy (postmenopausal): Secondary | ICD-10-CM | POA: Insufficient documentation

## 2020-03-26 DIAGNOSIS — Z79899 Other long term (current) drug therapy: Secondary | ICD-10-CM | POA: Insufficient documentation

## 2020-03-26 DIAGNOSIS — E039 Hypothyroidism, unspecified: Secondary | ICD-10-CM | POA: Diagnosis not present

## 2020-03-26 DIAGNOSIS — S60946A Unspecified superficial injury of right little finger, initial encounter: Secondary | ICD-10-CM | POA: Diagnosis present

## 2020-03-26 NOTE — Discharge Instructions (Addendum)
At this time there does not appear to be the presence of an emergent medical condition, however there is always the potential for conditions to change. Please read and follow the below instructions.  Please return to the Emergency Department immediately for any new or worsening symptoms. Please be sure to follow up with your Primary Care Provider within one week regarding your visit today; please call their office to schedule an appointment even if you are feeling better for a follow-up visit. Please call the hand specialist Dr. Fredna Dow on your discharge paperwork to schedule a follow-up appointment for further evaluation and treatment of your finger fracture.  Use rest ice and elevation to help with pain and swelling.  Please keep your abrasion on your right knee clean and apply a small amount of antibiotic ointment and keep it covered with a sterile bandages.  If you develop signs of infection return to the emergency department for evaluation.  Please discuss your tetanus status with your primary care doctor at your follow-up visit. Your blood pressure was elevated in the emergency department today.  Please have your blood pressure rechecked by your primary care doctor this week and discuss medication management at that time.  Be sure to take all of your daily medications as prescribed by your primary care doctor.  Get help right away if: Your finger gets numb or turns blue. Get a very bad headache. Start to feel mixed up (confused). Feel weak or numb. Feel faint. Have very bad pain in your: Chest. Belly (abdomen). Throw up more than once. Have trouble breathing. You have any new/concerning or worsening of symptoms  Please read the additional information packets attached to your discharge summary.  Do not take your medicine if  develop an itchy rash, swelling in your mouth or lips, or difficulty breathing; call 911 and seek immediate emergency medical attention if this occurs.  You may review  your lab tests and imaging results in their entirety on your MyChart account.  Please discuss all results of fully with your primary care provider and other specialist at your follow-up visit.  Note: Portions of this text may have been transcribed using voice recognition software. Every effort was made to ensure accuracy; however, inadvertent computerized transcription errors may still be present.

## 2020-03-26 NOTE — ED Provider Notes (Signed)
[facility] EMERGENCY DEPARTMENT Provider Note   CSN: [id number] Arrival date & time: 03/26/20  1053     History Chief Complaint  Patient presents with  . Fall  . Right hand swelling    Whitney [name] is a 79 y.o. female presents today for right fifth finger pain and swelling.  She reports Thursday night, 03/24/2020, she was walking up the stairs at a soccer game when she tripped on the stairs falling forward.  She caught herself on the next step with both of her hands, she struck her right fifth finger on the step had immediate pain which she describes as a moderate intensity throbbing pain worsened with movement palpation improved with time and rest, pain does not radiate.  Over the next few hours she developed swelling and bruising to her right fifth finger and to her ulnar hand.  She denies head injury, blood thinner use, headache, neck pain, back pain, chest pain, abdominal pain, numbness/weakness, tingling, skin break/wound, pain of the wrist, elbow, shoulder or any additional concerns.  HPI     Past Medical History:  Diagnosis Date  . Anemia    Iron def  . B12 deficiency   . Cancer ([place])    colon ca  . Depression   . History of colon cancer 2008  . Hypothyroidism   . Uterine polyp     Patient Active Problem List   Diagnosis Date Noted  . Coronary atherosclerosis 12/01/2019  . Vertigo 11/23/2019  . Elevated BP without diagnosis of hypertension 11/23/2019  . Grief reaction 07/21/2019  . Benign paroxysmal positional vertigo 09/30/2013  . Anxiety disorder 09/30/2013  . Family history of early CAD 01/14/2012  . Rash 07/09/2011  . Well adult exam 07/09/2011  . Neoplasm of uncertain behavior of skin 07/09/2011  . Bronchitis 04/16/2011  . TOBACCO USE, QUIT 10/07/2009  . B12 deficiency 04/04/2009  . DEPRESSION 04/04/2009  . PRURITUS 03/02/2009  . DIARRHEA 03/02/2009  . History of malignant neoplasm of large intestine 05/26/2008  . Hypothyroidism  04/19/2007  . ANEMIA-IRON DEFICIENCY 04/19/2007    Past Surgical History:  Procedure Laterality Date  . BREAST BIOPSY Right 10/08/1995   Benign Excisional Biopsy  . CHOLECYSTECTOMY    . COLON SURGERY  2008   Resection     OB History   No obstetric history on file.     Family History  Problem Relation Age of Onset  . Hypertension Mother   . Heart disease Mother 16       CHF  . Heart disease Father 30       MI died  . Heart disease Other        CAD and CHF  . Breast cancer Cousin     Social History   Tobacco Use  . Smoking status: Former Research scientist (life sciences)  . Smokeless tobacco: Never Used  Substance Use Topics  . Alcohol use: No  . Drug use: No    Home Medications Prior to Admission medications   Medication Sig Start Date End Date Taking? Authorizing Provider  acetaminophen (TYLENOL) 500 MG tablet Take 500 mg by mouth every 6 (six) hours as needed.      [provider]  aspirin 81 MG tablet Take 81 mg by mouth daily.    [provider]  Cholecalciferol (VITAMIN D3) 50 MCG (2000 UT) capsule Take 1 capsule (2,000 Units total) by mouth daily. 07/25/19   Plotnikov, Evie Lacks, MD  Cyanocobalamin (VITAMIN B-12) 1000 MCG SUBL Place 1 tablet (  1,000 mcg total) under the tongue daily. 04/16/11   Plotnikov, Evie Lacks, MD  FLUoxetine (PROZAC) 10 MG capsule Take 1 capsule (10 mg total) by mouth daily. Must keep appt for future reills 07/21/19   Plotnikov, Evie Lacks, MD  loratadine (CLARITIN) 10 MG tablet Take 1 tablet (10 mg total) by mouth daily. 11/23/19   Plotnikov, Evie Lacks, MD  meclizine (ANTIVERT) 12.5 MG tablet Take 1 tablet (12.5 mg total) by mouth 3 (three) times daily as needed for dizziness. 11/23/19 11/22/20  Plotnikov, Evie Lacks, MD  Multiple Vitamins-Minerals (WOMENS MULTIVITAMIN PO) Take by mouth.    [provider]  OVER THE COUNTER MEDICATION 2 (two) times daily. vitafusion    [provider]  OVER THE COUNTER MEDICATION blood sugar synergy     [provider]  rosuvastatin (CRESTOR) 5 MG tablet Take 1 tablet (5 mg total) by mouth daily. 12/05/19   Plotnikov, Evie Lacks, MD  SYNTHROID 125 MCG tablet TAKE 1 TABLET BY MOUTH ONCE DAILY 07/21/19   Plotnikov, Evie Lacks, MD    Allergies    Codeine phosphate and Penicillins  Review of Systems   Review of Systems  Constitutional: Negative.  Negative for chills and fever.  Cardiovascular: Negative.  Negative for chest pain.  Gastrointestinal: Negative.  Negative for abdominal pain.  Musculoskeletal: Positive for arthralgias. Negative for back pain and neck pain.  Skin: Negative for wound.  Neurological: Negative.  Negative for syncope, weakness, numbness and headaches.    Physical Exam Updated Vital Signs BP (!) 187/86 (BP Location: Left Arm)   Pulse 62   Temp 98.1 F (36.7 C)   Resp 16   SpO2 100%   Physical Exam Constitutional:      General: She is not in acute distress.    Appearance: Normal appearance. She is well-developed. She is not ill-appearing or diaphoretic.  HENT:     Head: Normocephalic and atraumatic. No abrasion or contusion.     Jaw: There is normal jaw occlusion. No trismus.     Right Ear: External ear normal.     Left Ear: External ear normal.     Nose: Nose normal.  Eyes:     General: Vision grossly intact. Gaze aligned appropriately.     Extraocular Movements: Extraocular movements intact.     Pupils: Pupils are equal, round, and reactive to light.  Neck:     Trachea: Trachea and phonation normal. No tracheal tenderness or tracheal deviation.  Cardiovascular:     Rate and Rhythm: Normal rate and regular rhythm.     Pulses:          Radial pulses are 2+ on the right side and 2+ on the left side.  Pulmonary:     Effort: Pulmonary effort is normal. No accessory muscle usage or respiratory distress.     Breath sounds: Normal air entry.  Chest:     Chest wall: No tenderness.  Abdominal:     General: There is no distension.     Palpations:  Abdomen is soft.     Tenderness: There is no abdominal tenderness. There is no guarding or rebound.  Musculoskeletal:     Right shoulder: Normal.     Left shoulder: Normal.     Right upper arm: Normal.     Left upper arm: Normal.     Right elbow: Normal range of motion. No tenderness.     Left elbow: Normal range of motion. No tenderness.     Right forearm: Normal.  Left forearm: Normal.     Right wrist: No deformity or tenderness. Normal range of motion.     Left wrist: No deformity or tenderness. Normal range of motion.     Right hand: Swelling and tenderness present. Decreased range of motion. Normal sensation. Normal capillary refill.     Left hand: Normal.       Hands:     Cervical back: Normal range of motion and neck supple. No spinous process tenderness or muscular tenderness.     Right hip: Normal range of motion.     Left hip: Normal range of motion.     Right knee: Normal range of motion. No tenderness.     Left knee: Normal range of motion. No tenderness.     Right ankle: No tenderness. Normal range of motion.     Left ankle: No tenderness. Normal range of motion.     Comments: No midline C/T/L spinal tenderness to palpation, no paraspinal muscle tenderness, no deformity, crepitus, or step-off noted.  Skin:    General: Skin is warm and dry.          Comments: Superficial abraision.  Neurological:     Mental Status: She is alert.     GCS: GCS eye subscore is 4. GCS verbal subscore is 5. GCS motor subscore is 6.     Comments: Speech is clear and goal oriented, follows commands Major Cranial nerves without deficit, no facial droop Moves extremities without ataxia, coordination intact  Psychiatric:        Behavior: Behavior normal.     ED Results / Procedures / Treatments   Labs (all labs ordered are listed, but only abnormal results are displayed) Labs Reviewed - No data to display  EKG None  Radiology DG Hand Complete Right  Result Date:  03/26/2020 CLINICAL DATA:  C/o fell 2 days ago and has pain/swelling/bruising in Rt little finger/metacarpal region EXAM: RIGHT HAND - COMPLETE 3+ VIEW COMPARISON:  None. FINDINGS: There is a dense ossified fragment versus a calcification along the dorsal aspect of the fifth finger PIP joint, avulsion fracture suggested on the oblique view from the dorsal ulnar base of the middle phalanx. No other evidence of a fracture. Joints are normally aligned. There is asymmetric joint space narrowing most evident D IP joints, index and middle fingers most prominently, with small marginal osteophytes consistent with osteoarthritis. Soft tissue swelling is noted at the PIP joint and proximal portion of the fifth finger. IMPRESSION: 1. Small avulsion fracture from the dorsal/ulnar base of the middle phalanx of the right fifth finger. There is associated soft tissue swelling. Electronically Signed   By: Lajean Manes M.D.   On: 03/26/2020 12:18    Procedures Procedures (including critical care time)  Medications Ordered in ED Medications - No data to display  ED Course  I have reviewed the triage vital signs and the nursing notes.  Pertinent labs & imaging results that were available during my care of the patient were reviewed by me and considered in my medical decision making (see chart for details).    MDM Rules/Calculators/A&P                          Additional history obtained from: 1. Nursing notes from this visit. --------------------------------------------- DG Right Hand:  IMPRESSION:  1. Small avulsion fracture from the dorsal/ulnar base of the middle  phalanx of the right fifth finger. There is associated soft tissue  swelling.  Physical examination consistent with avulsion fracture of the base of the right fifth middle phalanx seen on x-ray today.  She is good capillary refill and sensation distally, good range of motion as well at the joint but with some increased pain.  No evidence of  septic arthritis, cellulitis, DVT, compartment syndrome or other emergent pathologies.  She reports that she does not want any medication to help with pain she has been managing well at home with rest and elevation.  Encourage patient to follow-up with Dr. Fredna Dow for follow-up visit and further treatment of her finger fracture.  Additionally she has a small abrasion to her right knee, no evidence of infection and no pain with range of motion of the area, patient does not want any imaging of the area which I feel is appropriate, low suspicion for fracture/dislocation or other emergent pathologies, she is ambulatory without pain or difficulty.  Discussed abrasion home care and patient stated understanding.  She reports her Tdap is up-to-date within the last 10 years and will speak with her PCP about updating her Tdap at her follow-up appointment.  No history or evidence of head injury, injury of the neck chest back abdomen pelvis or other joints.  No indication for further imaging at this time.  Finally she was noted to have a high blood pressure in the emergency department today, I advised patient to take her blood pressure medication as prescribed and to follow-up with her PCP for blood pressure recheck this week.  She is asymptomatic regarding elevated blood pressure.  Signs/symptoms of hypertensive urgency/emergency given to patient and she is aware to return to the ER if they occur.  At this time there does not appear to be any evidence of an acute emergency medical condition and the patient appears stable for discharge with appropriate outpatient follow up. Diagnosis was discussed with patient who verbalizes understanding of care plan and is agreeable to discharge. I have discussed return precautions with patient list who verbalizes understanding. Patient encouraged to follow-up with their PCP and hand specialist. All questions answered.  Patient's case discussed with Dr. Sherry Ruffing who agrees with plan to  discharge with follow-up.   Note: Portions of this report may have been transcribed using voice recognition software. Every effort was made to ensure accuracy; however, inadvertent computerized transcription errors may still be present. Final Clinical Impression(s) / ED Diagnoses Final diagnoses:  Closed displaced fracture of middle phalanx of right little finger, initial encounter  Abrasion of right knee, initial encounter  Elevated blood pressure reading    Rx / DC Orders ED Discharge Orders    None       Gari Crown 03/26/20 1717    Tegeler, Gwenyth Allegra, MD 03/26/20 [phone]

## 2020-03-26 NOTE — ED Triage Notes (Signed)
Pt here after a fall days ago with right hand pain. Pinky finger bruised and swollen down hand.

## 2020-03-29 ENCOUNTER — Telehealth: Payer: Self-pay | Admitting: Internal Medicine

## 2020-03-29 NOTE — Telephone Encounter (Signed)
    Patient calling to report she has constipation. She has been taking Miralax , but no results Requesting advice/ prescription for medication Declined appointment because she is currently taking medication for congestion

## 2020-03-31 NOTE — Telephone Encounter (Signed)
Add OTC Dulcolax prn. High fiber diet. OV if not better Thx

## 2020-03-31 NOTE — Telephone Encounter (Signed)
Attemped to contact pt twice. She accidentally answering the phone so I cannot leave a vm. Will try again later

## 2020-04-07 NOTE — Telephone Encounter (Signed)
Left pt a detailed message of below

## 2020-04-25 DIAGNOSIS — S62626D Displaced fracture of medial phalanx of right little finger, subsequent encounter for fracture with routine healing: Secondary | ICD-10-CM | POA: Diagnosis not present

## 2020-04-25 DIAGNOSIS — S62626A Displaced fracture of medial phalanx of right little finger, initial encounter for closed fracture: Secondary | ICD-10-CM | POA: Insufficient documentation

## 2020-04-25 DIAGNOSIS — M79644 Pain in right finger(s): Secondary | ICD-10-CM | POA: Diagnosis not present

## 2020-07-17 ENCOUNTER — Other Ambulatory Visit: Payer: Self-pay | Admitting: Internal Medicine

## 2020-07-31 ENCOUNTER — Other Ambulatory Visit: Payer: Self-pay

## 2020-07-31 ENCOUNTER — Encounter: Payer: Self-pay | Admitting: Emergency Medicine

## 2020-07-31 ENCOUNTER — Ambulatory Visit
Admission: EM | Admit: 2020-07-31 | Discharge: 2020-07-31 | Disposition: A | Discharge status: Home / Self Care | Payer: Medicare HMO | Attending: Emergency Medicine | Admitting: Emergency Medicine

## 2020-07-31 DIAGNOSIS — Z20822 Contact with and (suspected) exposure to covid-19: Secondary | ICD-10-CM

## 2020-07-31 DIAGNOSIS — B9789 Other viral agents as the cause of diseases classified elsewhere: Secondary | ICD-10-CM

## 2020-07-31 MED ORDER — IPRATROPIUM BROMIDE 0.03 % NA SOLN: 2.0000 | Freq: Two times a day (BID) | NASAL | 0 refills | Status: DC

## 2020-07-31 MED ORDER — CETIRIZINE HCL 10 MG PO TABS: 10.0000 mg | ORAL_TABLET | Freq: Every day | ORAL | 0 refills | Status: DC

## 2020-07-31 NOTE — Discharge Instructions (Signed)
Your COVID 19 results will be available in 3-5 days. Negative results are immediately resulted to Mychart. Positive results will receive a follow-up call from our clinic. If symptoms are present, I recommend home quarantine until results are known. If COVID-19 positive quarantine for a total of 5 days per CDC guidelines.

## 2020-07-31 NOTE — ED Provider Notes (Signed)
[facility]    CSN: [id number] Arrival date & time: 07/31/20  1220      History   Chief Complaint Chief Complaint  Patient presents with  . Fatigue       . Cough    HPI Whitney [name] is a 80 y.o. female.   HPI  Patient presents with URI symptoms including cough, scratchy sore throat, nasal congestion, runny nose, and sinus pressure.  Unknown of COVID exposure. Denies worrisome symptoms of shortness of breath, weakness, N&V, chest pain or leg pain.  Past Medical History:  Diagnosis Date  . Anemia    Iron def  . B12 deficiency   . Cancer ([place])    colon ca  . Depression   . History of colon cancer 2008  . Hypothyroidism   . Uterine polyp     Patient Active Problem List   Diagnosis Date Noted  . Coronary atherosclerosis 12/01/2019  . Vertigo 11/23/2019  . Elevated BP without diagnosis of hypertension 11/23/2019  . Grief reaction 07/21/2019  . Benign paroxysmal positional vertigo 09/30/2013  . Anxiety disorder 09/30/2013  . Family history of early CAD 01/14/2012  . Rash 07/09/2011  . Well adult exam 07/09/2011  . Neoplasm of uncertain behavior of skin 07/09/2011  . Bronchitis 04/16/2011  . TOBACCO USE, QUIT 10/07/2009  . B12 deficiency 04/04/2009  . DEPRESSION 04/04/2009  . PRURITUS 03/02/2009  . DIARRHEA 03/02/2009  . History of malignant neoplasm of large intestine 05/26/2008  . Hypothyroidism 04/19/2007  . ANEMIA-IRON DEFICIENCY 04/19/2007    Past Surgical History:  Procedure Laterality Date  . BREAST BIOPSY Right 10/08/1995   Benign Excisional Biopsy  . CHOLECYSTECTOMY    . COLON SURGERY  2008   Resection    OB History   No obstetric history on file.      Home Medications    Prior to Admission medications   Medication Sig Start Date End Date Taking? Authorizing Provider  cetirizine (ZYRTEC) 10 MG tablet Take 1 tablet (10 mg total) by mouth daily. 07/31/20  Yes Scot Jun, FNP  ipratropium (ATROVENT) 0.03 % nasal  spray Place 2 sprays into both nostrils 2 (two) times daily. 07/31/20  Yes Scot Jun, FNP  acetaminophen (TYLENOL) 500 MG tablet Take 500 mg by mouth every 6 (six) hours as needed.      [provider]  aspirin 81 MG tablet Take 81 mg by mouth daily.    [provider]  Cholecalciferol (VITAMIN D3) 50 MCG (2000 UT) capsule Take 1 capsule (2,000 Units total) by mouth daily. 07/25/19   Plotnikov, Evie Lacks, MD  Cyanocobalamin (VITAMIN B-12) 1000 MCG SUBL Place 1 tablet (1,000 mcg total) under the tongue daily. 04/16/11   Plotnikov, Evie Lacks, MD  FLUoxetine (PROZAC) 10 MG capsule Take 1 capsule (10 mg total) by mouth daily. Must keep appt for future reills 07/21/19   Plotnikov, Evie Lacks, MD  loratadine (CLARITIN) 10 MG tablet Take 1 tablet (10 mg total) by mouth daily. 11/23/19   Plotnikov, Evie Lacks, MD  meclizine (ANTIVERT) 12.5 MG tablet Take 1 tablet (12.5 mg total) by mouth 3 (three) times daily as needed for dizziness. 11/23/19 11/22/20  Plotnikov, Evie Lacks, MD  Multiple Vitamins-Minerals (WOMENS MULTIVITAMIN PO) Take by mouth.    [provider]  OVER THE COUNTER MEDICATION 2 (two) times daily. vitafusion    [provider]  OVER THE COUNTER MEDICATION blood sugar synergy    [provider]  rosuvastatin (CRESTOR)  5 MG tablet Take 1 tablet (5 mg total) by mouth daily. 12/05/19   Plotnikov, Evie Lacks, MD  SYNTHROID 125 MCG tablet Take 1 tablet by mouth once daily 07/18/20   Plotnikov, Evie Lacks, MD    Family History Family History  Problem Relation Age of Onset  . Hypertension Mother   . Heart disease Mother 32       CHF  . Heart disease Father 31       MI died  . Heart disease Other        CAD and CHF  . Breast cancer Cousin     Social History Social History   Tobacco Use  . Smoking status: Former Research scientist (life sciences)  . Smokeless tobacco: Never Used  Substance Use Topics  . Alcohol use: No  . Drug use: No     Allergies   Codeine phosphate  and Penicillins   Review of Systems Review of Systems Pertinent negatives listed in HPI  Physical Exam Triage Vital Signs ED Triage Vitals [07/31/20 1309]  Enc Vitals Group     BP (!) 147/91     Pulse Rate 78     Resp 18     Temp 98.2 F (36.8 C)     Temp Source Oral     SpO2 94 %     Weight      Height      Head Circumference      Peak Flow      Pain Score 4     Pain Loc      Pain Edu?      Excl. in [place]?    No data found.  Updated Vital Signs BP (!) 147/91 (BP Location: Left Arm)   Pulse 78   Temp 98.2 F (36.8 C) (Oral)   Resp 18   SpO2 94%   Visual Acuity Right Eye Distance:   Left Eye Distance:   Bilateral Distance:    Right Eye Near:   Left Eye Near:    Bilateral Near:     Physical Exam Constitutional:      Appearance: She is not ill-appearing.  HENT:     Head: Normocephalic.     Nose: Congestion and rhinorrhea present.  Eyes:     Extraocular Movements: Extraocular movements intact.     Pupils: Pupils are equal, round, and reactive to light.  Cardiovascular:     Rate and Rhythm: Normal rate and regular rhythm.  Pulmonary:     Effort: Pulmonary effort is normal.     Breath sounds: Normal breath sounds. No wheezing or rhonchi.  Musculoskeletal:        General: Normal range of motion.     Cervical back: Normal range of motion and neck supple.  Skin:    Capillary Refill: Capillary refill takes less than 2 seconds.  Neurological:     General: No focal deficit present.     Mental Status: She is alert and oriented to person, place, and time.     Gait: Gait normal.  Psychiatric:        Mood and Affect: Mood normal.        Behavior: Behavior normal.        Thought Content: Thought content normal.        Judgment: Judgment normal.     UC Treatments / Results  Labs (all labs ordered are listed, but only abnormal results are displayed) Labs Reviewed  NOVEL CORONAVIRUS, NAA    EKG   Radiology No results  found.  Procedures Procedures  (including critical care time)  Medications Ordered in UC Medications - No data to display  Initial Impression / Assessment and Plan / UC Course  I have reviewed the triage vital signs and the nursing notes.  Pertinent labs & imaging results that were available during my care of the patient were reviewed by me and considered in my medical decision making (see chart for details).    COVID/Flu test pending. Symptom management warranted only.  Manage fever with Tylenol and ibuprofen.  Nasal symptoms with over-the-counter antihistamines recommended.  Treatment per discharge medications/discharge instructions.  Red flags/ER precautions given. The most current CDC isolation/quarantine recommendation advised.   Final Clinical Impressions(s) / UC Diagnoses   Final diagnoses:  Encounter for screening laboratory testing for COVID-19 virus  Viral respiratory illness     Discharge Instructions     Your COVID 19 results will be available in 3-5 days. Negative results are immediately resulted to Mychart. Positive results will receive a follow-up call from our clinic. If symptoms are present, I recommend home quarantine until results are known. If COVID-19 positive quarantine for a total of 5 days per CDC guidelines.      ED Prescriptions    Medication Sig Dispense Auth. Provider   cetirizine (ZYRTEC) 10 MG tablet Take 1 tablet (10 mg total) by mouth daily. 30 tablet Scot Jun, FNP   ipratropium (ATROVENT) 0.03 % nasal spray Place 2 sprays into both nostrils 2 (two) times daily. 30 mL Scot Jun, FNP     PDMP not reviewed this encounter.   Scot Jun, FNP 07/31/20 1344

## 2020-07-31 NOTE — ED Triage Notes (Signed)
Pt sts fatigue, cough and nasal congestion x 3 days

## 2020-08-04 LAB — NOVEL CORONAVIRUS, NAA: SARS-CoV-2, NAA: DETECTED — AB

## 2020-08-05 ENCOUNTER — Telehealth: Payer: Self-pay | Admitting: Unknown Physician Specialty

## 2020-08-05 NOTE — Telephone Encounter (Signed)
I connected by phone with Whitney [name] on 08/05/2020 at 10:46 AM to discuss the potential use of a new treatment for mild to moderate COVID-19 viral infection in non-hospitalized patients.  This patient is a 80 y.o. female that meets the FDA criteria for Emergency Use Authorization of COVID monoclonal antibody sotrovimab.  Has a (+) direct SARS-CoV-2 viral test result  Has mild or moderate COVID-19   Is NOT hospitalized due to COVID-19  Is within 10 days of symptom onset  Has at least one of the high risk factor(s) for progression to severe COVID-19 and/or hospitalization as defined in EUA.  Specific high risk criteria : Older age (>/= 78 yo)   I have spoken and communicated the following to the patient or parent/caregiver regarding COVID monoclonal antibody treatment:  1. FDA has authorized the emergency use for the treatment of mild to moderate COVID-19 in adults and pediatric patients with positive results of direct SARS-CoV-2 viral testing who are 47 years of age and older weighing at least 40 kg, and who are at high risk for progressing to severe COVID-19 and/or hospitalization.  2. The significant known and potential risks and benefits of COVID monoclonal antibody, and the extent to which such potential risks and benefits are unknown.  3. Information on available alternative treatments and the risks and benefits of those alternatives, including clinical trials.  4. Patients treated with COVID monoclonal antibody should continue to self-isolate and use infection control measures (e.g., wear mask, isolate, social distance, avoid sharing personal items, clean and disinfect "high touch" surfaces, and frequent handwashing) according to CDC guidelines.   5. The patient or parent/caregiver has the option to accept or refuse COVID monoclonal antibody treatment.  After reviewing this information with the patient, the patient has DECLINED offer to receive the infusion. Whitney Haddock,  NP 08/05/2020 10:46 AM

## 2020-08-21 ENCOUNTER — Other Ambulatory Visit: Payer: Self-pay | Admitting: Family Medicine

## 2020-08-22 NOTE — Telephone Encounter (Signed)
Sending to CMA 

## 2020-11-19 ENCOUNTER — Other Ambulatory Visit: Payer: Self-pay | Admitting: Internal Medicine

## 2020-12-20 ENCOUNTER — Other Ambulatory Visit: Payer: Self-pay | Admitting: Internal Medicine

## 2020-12-20 DIAGNOSIS — Z1231 Encounter for screening mammogram for malignant neoplasm of breast: Secondary | ICD-10-CM

## 2021-02-07 DIAGNOSIS — K5900 Constipation, unspecified: Secondary | ICD-10-CM | POA: Diagnosis not present

## 2021-02-11 ENCOUNTER — Other Ambulatory Visit: Payer: Self-pay | Admitting: Internal Medicine

## 2021-02-13 ENCOUNTER — Ambulatory Visit
Admission: RE | Admit: 2021-02-13 | Discharge: 2021-02-13 | Disposition: A | Discharge status: Home / Self Care | Payer: Medicare HMO | Source: Ambulatory Visit | Attending: Internal Medicine | Admitting: Internal Medicine

## 2021-02-13 ENCOUNTER — Other Ambulatory Visit: Payer: Self-pay

## 2021-02-13 DIAGNOSIS — Z1231 Encounter for screening mammogram for malignant neoplasm of breast: Secondary | ICD-10-CM | POA: Diagnosis not present

## 2021-02-22 ENCOUNTER — Ambulatory Visit: Payer: Medicare HMO

## 2021-03-15 ENCOUNTER — Other Ambulatory Visit: Payer: Self-pay

## 2021-03-15 ENCOUNTER — Ambulatory Visit (INDEPENDENT_AMBULATORY_CARE_PROVIDER_SITE_OTHER): Payer: Medicare HMO

## 2021-03-15 VITALS — BP 118/70 | HR 66 | Ht 64.0 in | Wt 153.2 lb

## 2021-03-15 DIAGNOSIS — Z0000 Encounter for general adult medical examination without abnormal findings: Secondary | ICD-10-CM | POA: Diagnosis not present

## 2021-03-15 NOTE — Progress Notes (Addendum)
Subjective:   Whitney [name] is a 80 y.o. female who presents for Medicare Annual (Subsequent) preventive examination.  Review of Systems     Cardiac Risk Factors include: advanced age (>46mn, >>49women);dyslipidemia;family history of premature cardiovascular disease     Objective:    Today's Vitals   03/15/21 1504  BP: 118/70  Pulse: 66  SpO2: 95%  Weight: 153 lb 3.2 oz (69.5 kg)  Height: '5\' 4"'$  (1.626 m)  PainSc: 0-No pain   Body mass index is 26.3 kg/m.  Advanced Directives 03/15/2021 03/26/2020  Does Patient Have a Medical Advance Directive? Yes Yes  Type of Advance Directive Living will;Healthcare Power of ANaylorin Chart? No - copy requested -    Current Medications (verified) Outpatient Encounter Medications as of 03/15/2021  Medication Sig   acetaminophen (TYLENOL) 500 MG tablet Take 500 mg by mouth every 6 (six) hours as needed.     aspirin 81 MG tablet Take 81 mg by mouth daily.   cetirizine (ZYRTEC) 10 MG tablet TAKE 1 TABLET BY MOUTH EVERY DAY   Cholecalciferol (VITAMIN D3) 50 MCG (2000 UT) capsule Take 1 capsule (2,000 Units total) by mouth daily.   Cyanocobalamin (VITAMIN B-12) 1000 MCG SUBL Place 1 tablet (1,000 mcg total) under the tongue daily.   FLUoxetine (PROZAC) 10 MG capsule Take 1 capsule (10 mg total) by mouth daily. Must keep appt for future reills   ipratropium (ATROVENT) 0.03 % nasal spray INSTILL 2 SPRAYS INTO EACH NOSTRIL TWICE A DAY   loratadine (CLARITIN) 10 MG tablet Take 1 tablet (10 mg total) by mouth daily.   Multiple Vitamins-Minerals (WOMENS MULTIVITAMIN PO) Take by mouth.   OVER THE COUNTER MEDICATION 2 (two) times daily. vitafusion   OVER THE COUNTER MEDICATION blood sugar synergy   rosuvastatin (CRESTOR) 5 MG tablet Take 1 tablet by mouth once daily Overdue for Annual appt w/ labs must see provider for future refills   SYNTHROID 125 MCG tablet Take 1 tablet by mouth once daily   No  facility-administered encounter medications on file as of 03/15/2021.    Allergies (verified) Codeine phosphate and Penicillins   History: Past Medical History:  Diagnosis Date   Anemia    Iron def   B12 deficiency    Cancer ([facility])    colon ca   Depression    History of colon cancer 2008   Hypothyroidism    Uterine polyp    Past Surgical History:  Procedure Laterality Date   BREAST BIOPSY Right 10/08/1995   Benign Excisional Biopsy   CHOLECYSTECTOMY     COLON SURGERY  2008   Resection   Family History  Problem Relation Age of Onset   Hypertension Mother    Heart disease Mother 729      CHF   Heart disease Father 460      MI died   Heart disease Other        CAD and CHF   Breast cancer Cousin    Social History   Socioeconomic History   Marital status: Widowed    Spouse name: Not on file   Number of children: Not on file   Years of education: Not on file   Highest education level: Not on file  Occupational History   Not on file  Tobacco Use   Smoking status: Former   Smokeless tobacco: Never  Substance and Sexual Activity   Alcohol use: No  Drug use: No   Sexual activity: Yes  Other Topics Concern   Not on file  Social History Narrative   Not on file   Social Determinants of Health   Financial Resource Strain: Low Risk    Difficulty of Paying Living Expenses: Not hard at all  Food Insecurity: No Food Insecurity   Worried About Charity fundraiser in the Last Year: Never true   [place] in the Last Year: Never true  Transportation Needs: No Transportation Needs   Lack of Transportation (Medical): No   Lack of Transportation (Non-Medical): No  Physical Activity: Sufficiently Active   Days of Exercise per Week: 5 days   Minutes of Exercise per Session: 30 min  Stress: No Stress Concern Present   Feeling of Stress : Not at all  Social Connections: Moderately Integrated   Frequency of Communication with Friends and Family: More than three  times a week   Frequency of Social Gatherings with Friends and Family: Twice a week   Attends Religious Services: More than 4 times per year   Active Member of [employer] or Organizations: Yes   Attends Archivist Meetings: More than 4 times per year   Marital Status: Widowed    Tobacco Counseling Counseling given: Not Answered   Clinical Intake:  Pre-visit preparation completed: Yes  Pain : No/denies pain Pain Score: 0-No pain     BMI - recorded: 26.3 Nutritional Status: BMI 25 -29 Overweight Nutritional Risks: None Diabetes: No  How often do you need to have someone help you when you read instructions, pamphlets, or other written materials from your doctor or pharmacy?: 1 - Never What is the last grade level you completed in school?: 1 year of college  Diabetic? no  Interpreter Needed?: No  Information entered by :: [name], LPN   Activities of Daily Living In your present state of health, do you have any difficulty performing the following activities: 03/15/2021  Hearing? N  Vision? N  Difficulty concentrating or making decisions? N  Walking or climbing stairs? N  Dressing or bathing? N  Doing errands, shopping? N  Preparing Food and eating ? N  Using the Toilet? N  In the past six months, have you accidently leaked urine? N  Do you have problems with loss of bowel control? N  Managing your Medications? N  Managing your Finances? N  Housekeeping or managing your Housekeeping? N  Some recent data might be hidden    Patient Care Team: Plotnikov, Evie Lacks, MD as PCP - General Louretta Shorten, MD as Attending Physician (Obstetrics and Gynecology) Clarene Essex, MD (Gastroenterology) Odogwu, Genevie Cheshire, MD (Hematology and Oncology)  Indicate any recent Medical Services you may have received from other than [facility] providers in the past year (date may be approximate).     Assessment:   This is a routine wellness examination for  Whitney [name].  Hearing/Vision screen No results found.  Dietary issues and exercise activities discussed: Current Exercise Habits: Home exercise routine, Type of exercise: walking;treadmill;strength training/weights;stretching (just enrolled with [employer]; has home gym also), Time (Minutes): 30, Frequency (Times/Week): 5, Weekly Exercise (Minutes/Week): 150, Intensity: Moderate, Exercise limited by: None identified   Goals Addressed               This Visit's Progress     Patient Stated (pt-stated)        To maintain my current health status by continuing to eat healthy, stay physically active and socially  active.      Depression Screen PHQ 2/9 Scores 03/15/2021 03/01/2016  PHQ - 2 Score 0 0    Fall Risk Fall Risk  03/15/2021 09/15/2018 05/22/2017 03/01/2016  Falls in the past year? 0 0 No No  Number falls in past yr: 0 - - -  Injury with Fall? 0 - - -  Risk for fall due to : No Fall Risks - - -  Follow up Falls evaluation completed - - -    FALL RISK PREVENTION PERTAINING TO THE HOME:  Any stairs in or around the home? No  If so, are there any without handrails? No  Home free of loose throw rugs in walkways, pet beds, electrical cords, etc? Yes  Adequate lighting in your home to reduce risk of falls? Yes   ASSISTIVE DEVICES UTILIZED TO PREVENT FALLS:  Life alert? No  Use of a cane, walker or w/c? No  Grab bars in the bathroom? Yes  Shower chair or bench in shower? Yes  Elevated toilet seat or a handicapped toilet? Yes   TIMED UP AND GO:  Was the test performed? Yes .  Length of time to ambulate 10 feet: 5 sec.   Gait steady and fast without use of assistive device  Cognitive Function: Normal cognitive status assessed by direct observation by this Nurse Health Advisor. No abnormalities found.          Immunizations Immunization History  Administered Date(s) Administered   Influenza Whole 05/07/2008, 05/21/2008, 05/05/2009   Pneumococcal Conjugate-13  12/17/2014   Pneumococcal Polysaccharide-23 05/21/2008, 03/01/2016   Tdap 07/09/2011   Zoster, Live 08/01/2014    TDAP status: Up to date  Flu Vaccine status: Declined, Education has been provided regarding the importance of this vaccine but patient still declined. Advised may receive this vaccine at local pharmacy or Health Dept. Aware to provide a copy of the vaccination record if obtained from local pharmacy or Health Dept. Verbalized acceptance and understanding.  Pneumococcal vaccine status: Up to date  Covid-19 vaccine status: Declined, Education has been provided regarding the importance of this vaccine but patient still declined. Advised may receive this vaccine at local pharmacy or Health Dept.or vaccine clinic. Aware to provide a copy of the vaccination record if obtained from local pharmacy or Health Dept. Verbalized acceptance and understanding.  Qualifies for Shingles Vaccine? Yes   Zostavax completed Yes   Shingrix Completed?: No.    Education has been provided regarding the importance of this vaccine. Patient has been advised to call insurance company to determine out of pocket expense if they have not yet received this vaccine. Advised may also receive vaccine at local pharmacy or Health Dept. Verbalized acceptance and understanding.  Screening Tests Health Maintenance  Topic Date Due   COVID-19 Vaccine (1) Never done   Hepatitis C Screening  Never done   Zoster Vaccines- Shingrix (1 of 2) Never done   DEXA SCAN  Never done   INFLUENZA VACCINE  02/20/2021   TETANUS/TDAP  07/08/2021   PNA vac Low Risk Adult  Completed   HPV VACCINES  Aged Out    Health Maintenance  Health Maintenance Due  Topic Date Due   COVID-19 Vaccine (1) Never done   Hepatitis C Screening  Never done   Zoster Vaccines- Shingrix (1 of 2) Never done   DEXA SCAN  Never done   INFLUENZA VACCINE  02/20/2021    Colorectal cancer screening: No longer required.   Mammogram status: Completed  02/13/2021. Repeat every  year  Bone density: never done  Lung Cancer Screening: (Low Dose CT Chest recommended if Age 22-80 years, 30 pack-year currently smoking OR have quit w/in 15years.) does not qualify.   Lung Cancer Screening Referral: no  Additional Screening:  Hepatitis C Screening: does qualify; Completed no  Vision Screening: Recommended annual ophthalmology exams for early detection of glaucoma and other disorders of the eye. Is the patient up to date with their annual eye exam?  Yes  Who is the provider or what is the name of the office in which the patient attends annual eye exams? [place] If pt is not established with a provider, would they like to be referred to a provider to establish care? No .   Dental Screening: Recommended annual dental exams for proper oral hygiene  Community Resource Referral / Chronic Care Management: CRR required this visit?  No   CCM required this visit?  No      Plan:     I have personally reviewed and noted the following in the patient's chart:   Medical and social history Use of alcohol, tobacco or illicit drugs  Current medications and supplements including opioid prescriptions.  Functional ability and status Nutritional status Physical activity Advanced directives List of other physicians Hospitalizations, surgeries, and ER visits in previous 12 months Vitals Screenings to include cognitive, depression, and falls Referrals and appointments  In addition, I have reviewed and discussed with patient certain preventive protocols, quality metrics, and best practice recommendations. A written personalized care plan for preventive services as well as general preventive health recommendations were provided to patient.     Sheral Flow, LPN   [id number]   Nurse Notes: n/a  Medical screening examination/treatment/procedure(s) were performed by non-physician practitioner and as supervising physician I was  immediately available for consultation/collaboration.  I agree with above. Lew Dawes, MD

## 2021-03-15 NOTE — Patient Instructions (Signed)
Whitney [name] , Thank you for taking time to come for your Medicare Wellness Visit. I appreciate your ongoing commitment to your health goals. Please review the following plan we discussed and let me know if I can assist you in the future.   Screening recommendations/referrals: Colonoscopy: last done 08/27/2018 Mammogram: last done 02/13/2021 Bone Density: never done or no record Recommended yearly ophthalmology/optometry visit for glaucoma screening and checkup Recommended yearly dental visit for hygiene and checkup  Vaccinations: Influenza vaccine: due Fall 2022 Pneumococcal vaccine: 12/17/2014, 03/01/2016 Tdap vaccine: 07/09/2011; due every 10 years  Shingles vaccine: declined Covid-19: declined  Advanced directives: Please bring a copy of your health care power of attorney and living will to the office at your convenience.  Conditions/risks identified: Yes; Client understands the importance of follow-up with providers by attending scheduled visits and discussed goals to eat healthier, increase physical activity, exercise the brain, socialize more, get enough sleep and make time for laughter.  Next appointment: Please schedule your next Medicare Wellness Visit with your Nurse Health Advisor in 1 year by calling [phone].   Preventive Care 60 Years and Older, Female Preventive care refers to lifestyle choices and visits with your health care provider that can promote health and wellness. What does preventive care include? A yearly physical exam. This is also called an annual well check. Dental exams once or twice a year. Routine eye exams. Ask your health care provider how often you should have your eyes checked. Personal lifestyle choices, including: Daily care of your teeth and gums. Regular physical activity. Eating a healthy diet. Avoiding tobacco and drug use. Limiting alcohol use. Practicing safe sex. Taking low-dose aspirin every day. Taking vitamin and mineral  supplements as recommended by your health care provider. What happens during an annual well check? The services and screenings done by your health care provider during your annual well check will depend on your age, overall health, lifestyle risk factors, and family history of disease. Counseling  Your health care provider may ask you questions about your: Alcohol use. Tobacco use. Drug use. Emotional well-being. Home and relationship well-being. Sexual activity. Eating habits. History of falls. Memory and ability to understand (cognition). Work and work Statistician. Reproductive health. Screening  You may have the following tests or measurements: Height, weight, and BMI. Blood pressure. Lipid and cholesterol levels. These may be checked every 5 years, or more frequently if you are over 97 years old. Skin check. Lung cancer screening. You may have this screening every year starting at age 32 if you have a 30-pack-year history of smoking and currently smoke or have quit within the past 15 years. Fecal occult blood test (FOBT) of the stool. You may have this test every year starting at age 42. Flexible sigmoidoscopy or colonoscopy. You may have a sigmoidoscopy every 5 years or a colonoscopy every 10 years starting at age 63. Hepatitis C blood test. Hepatitis B blood test. Sexually transmitted disease (STD) testing. Diabetes screening. This is done by checking your blood sugar (glucose) after you have not eaten for a while (fasting). You may have this done every 1-3 years. Bone density scan. This is done to screen for osteoporosis. You may have this done starting at age 16. Mammogram. This may be done every 1-2 years. Talk to your health care provider about how often you should have regular mammograms. Talk with your health care provider about your test results, treatment options, and if necessary, the need for more tests. Vaccines  Your health care  provider may recommend certain  vaccines, such as: Influenza vaccine. This is recommended every year. Tetanus, diphtheria, and acellular pertussis (Tdap, Td) vaccine. You may need a Td booster every 10 years. Zoster vaccine. You may need this after age 52. Pneumococcal 13-valent conjugate (PCV13) vaccine. One dose is recommended after age 33. Pneumococcal polysaccharide (PPSV23) vaccine. One dose is recommended after age 64. Talk to your health care provider about which screenings and vaccines you need and how often you need them. This information is not intended to replace advice given to you by your health care provider. Make sure you discuss any questions you have with your health care provider. Document Released: 08/05/2015 Document Revised: 03/28/2016 Document Reviewed: 05/10/2015 Elsevier Interactive Patient Education  2017 [place] Prevention in the Home Falls can cause injuries. They can happen to people of all ages. There are many things you can do to make your home safe and to help prevent falls. What can I do on the outside of my home? Regularly fix the edges of walkways and driveways and fix any cracks. Remove anything that might make you trip as you walk through a door, such as a raised step or threshold. Trim any bushes or trees on the path to your home. Use bright outdoor lighting. Clear any walking paths of anything that might make someone trip, such as rocks or tools. Regularly check to see if handrails are loose or broken. Make sure that both sides of any steps have handrails. Any raised decks and porches should have guardrails on the edges. Have any leaves, snow, or ice cleared regularly. Use sand or salt on walking paths during winter. Clean up any spills in your garage right away. This includes oil or grease spills. What can I do in the bathroom? Use night lights. Install grab bars by the toilet and in the tub and shower. Do not use towel bars as grab bars. Use non-skid mats or decals in  the tub or shower. If you need to sit down in the shower, use a plastic, non-slip stool. Keep the floor dry. Clean up any water that spills on the floor as soon as it happens. Remove soap buildup in the tub or shower regularly. Attach bath mats securely with double-sided non-slip rug tape. Do not have throw rugs and other things on the floor that can make you trip. What can I do in the bedroom? Use night lights. Make sure that you have a light by your bed that is easy to reach. Do not use any sheets or blankets that are too big for your bed. They should not hang down onto the floor. Have a firm chair that has side arms. You can use this for support while you get dressed. Do not have throw rugs and other things on the floor that can make you trip. What can I do in the kitchen? Clean up any spills right away. Avoid walking on wet floors. Keep items that you use a lot in easy-to-reach places. If you need to reach something above you, use a strong step stool that has a grab bar. Keep electrical cords out of the way. Do not use floor polish or wax that makes floors slippery. If you must use wax, use non-skid floor wax. Do not have throw rugs and other things on the floor that can make you trip. What can I do with my stairs? Do not leave any items on the stairs. Make sure that there are handrails on both  sides of the stairs and use them. Fix handrails that are broken or loose. Make sure that handrails are as long as the stairways. Check any carpeting to make sure that it is firmly attached to the stairs. Fix any carpet that is loose or worn. Avoid having throw rugs at the top or bottom of the stairs. If you do have throw rugs, attach them to the floor with carpet tape. Make sure that you have a light switch at the top of the stairs and the bottom of the stairs. If you do not have them, ask someone to add them for you. What else can I do to help prevent falls? Wear shoes that: Do not have high  heels. Have rubber bottoms. Are comfortable and fit you well. Are closed at the toe. Do not wear sandals. If you use a stepladder: Make sure that it is fully opened. Do not climb a closed stepladder. Make sure that both sides of the stepladder are locked into place. Ask someone to hold it for you, if possible. Clearly mark and make sure that you can see: Any grab bars or handrails. First and last steps. Where the edge of each step is. Use tools that help you move around (mobility aids) if they are needed. These include: Canes. Walkers. Scooters. Crutches. Turn on the lights when you go into a dark area. Replace any light bulbs as soon as they burn out. Set up your furniture so you have a clear path. Avoid moving your furniture around. If any of your floors are uneven, fix them. If there are any pets around you, be aware of where they are. Review your medicines with your doctor. Some medicines can make you feel dizzy. This can increase your chance of falling. Ask your doctor what other things that you can do to help prevent falls. This information is not intended to replace advice given to you by your health care provider. Make sure you discuss any questions you have with your health care provider. Document Released: 05/05/2009 Document Revised: 12/15/2015 Document Reviewed: 08/13/2014 Elsevier Interactive Patient Education  2017 [employer].

## 2021-03-18 ENCOUNTER — Other Ambulatory Visit: Payer: Self-pay | Admitting: Internal Medicine

## 2021-03-20 ENCOUNTER — Other Ambulatory Visit: Payer: Self-pay

## 2021-03-20 MED ORDER — ROSUVASTATIN CALCIUM 5 MG PO TABS: ORAL_TABLET | ORAL | 0 refills | Status: DC

## 2021-03-20 NOTE — Telephone Encounter (Signed)
1.Medication Requested: Atrovastin  2. Pharmacy (Name, [place]): [facility]  3. On Med List: yes  4. Last Visit with PCP: 03/15/2021  5. Next visit date with PCP:   Agent: Please be advised that RX refills may take up to 3 business days. We ask that you follow-up with your pharmacy.

## 2021-04-24 ENCOUNTER — Other Ambulatory Visit: Payer: Self-pay | Admitting: Internal Medicine

## 2021-05-13 ENCOUNTER — Other Ambulatory Visit: Payer: Self-pay | Admitting: Internal Medicine

## 2021-06-17 ENCOUNTER — Telehealth: Payer: Self-pay | Admitting: Internal Medicine

## 2021-06-19 MED ORDER — ROSUVASTATIN CALCIUM 5 MG PO TABS: ORAL_TABLET | ORAL | 0 refills | Status: DC

## 2021-06-19 NOTE — Telephone Encounter (Signed)
Patient called in to schedule med refill appt.. scheduled 1st available w/ provider 12/21 @ 11:20  Patient says she is currently down to her last few pills & wants to know if provider will approve a short fill until OV  Please call patient [phone]

## 2021-06-19 NOTE — Addendum Note (Signed)
Addended by: Earnstine Regal on: 06/19/2021 01:37 PM   Modules accepted: Orders

## 2021-06-19 NOTE — Telephone Encounter (Signed)
Per office policy sent 30 day to local pharmacy until appt.../lmb  

## 2021-07-10 ENCOUNTER — Other Ambulatory Visit: Payer: Self-pay | Admitting: Internal Medicine

## 2021-07-12 ENCOUNTER — Encounter: Payer: Self-pay | Admitting: Internal Medicine

## 2021-07-12 ENCOUNTER — Other Ambulatory Visit: Payer: Self-pay

## 2021-07-12 ENCOUNTER — Ambulatory Visit (INDEPENDENT_AMBULATORY_CARE_PROVIDER_SITE_OTHER): Payer: Medicare HMO | Admitting: Internal Medicine

## 2021-07-12 VITALS — BP 140/82 | HR 79 | Temp 98.0°F | Ht 63.0 in | Wt 152.6 lb

## 2021-07-12 DIAGNOSIS — E538 Deficiency of other specified B group vitamins: Secondary | ICD-10-CM | POA: Diagnosis not present

## 2021-07-12 DIAGNOSIS — E034 Atrophy of thyroid (acquired): Secondary | ICD-10-CM | POA: Diagnosis not present

## 2021-07-12 DIAGNOSIS — Z85038 Personal history of other malignant neoplasm of large intestine: Secondary | ICD-10-CM

## 2021-07-12 DIAGNOSIS — I2510 Atherosclerotic heart disease of native coronary artery without angina pectoris: Secondary | ICD-10-CM | POA: Diagnosis not present

## 2021-07-12 DIAGNOSIS — J069 Acute upper respiratory infection, unspecified: Secondary | ICD-10-CM | POA: Diagnosis not present

## 2021-07-12 DIAGNOSIS — E559 Vitamin D deficiency, unspecified: Secondary | ICD-10-CM

## 2021-07-12 DIAGNOSIS — I2583 Coronary atherosclerosis due to lipid rich plaque: Secondary | ICD-10-CM

## 2021-07-12 MED ORDER — SYNTHROID 125 MCG PO TABS
125.0000 ug | ORAL_TABLET | Freq: Every day | ORAL | 0 refills | Status: DC
Start: 2021-07-12 — End: 2021-10-11

## 2021-07-12 MED ORDER — PROMETHAZINE-DM 6.25-15 MG/5ML PO SYRP: 5.0000 mL | ORAL_SOLUTION | Freq: Four times a day (QID) | ORAL | 0 refills | Status: DC | PRN

## 2021-07-12 MED ORDER — AZITHROMYCIN 250 MG PO TABS: ORAL_TABLET | ORAL | 0 refills | Status: DC

## 2021-07-12 NOTE — Assessment & Plan Note (Signed)
On Crestor Will get lipids

## 2021-07-12 NOTE — Progress Notes (Signed)
Subjective:  Patient ID: Whitney [name], female    DOB: 1941/06/16  Age: 80 y.o. MRN: [id number]  CC: Follow-up (Req refill on Levothyroxine) and Nasal Congestion   HPI Whitney [name] presents for URI sx's x 3 d - worse F/u hypothyroidism, dyslipidemia, h/o colon cancer   Outpatient Medications Prior to Visit  Medication Sig Dispense Refill   acetaminophen (TYLENOL) 500 MG tablet Take 500 mg by mouth every 6 (six) hours as needed.       aspirin 81 MG tablet Take 81 mg by mouth daily.     cetirizine (ZYRTEC) 10 MG tablet TAKE 1 TABLET BY MOUTH EVERY DAY 30 tablet 5   Cholecalciferol (VITAMIN D3) 50 MCG (2000 UT) capsule Take 1 capsule (2,000 Units total) by mouth daily. 100 capsule 3   Cyanocobalamin (VITAMIN B-12) 1000 MCG SUBL Place 1 tablet (1,000 mcg total) under the tongue daily. 100 tablet 3   FLUoxetine (PROZAC) 10 MG capsule Take 1 capsule (10 mg total) by mouth daily. Must keep appt for future reills 90 capsule 3   ipratropium (ATROVENT) 0.03 % nasal spray INSTILL 2 SPRAYS INTO EACH NOSTRIL TWICE A DAY 30 mL 5   loratadine (CLARITIN) 10 MG tablet Take 1 tablet (10 mg total) by mouth daily. 30 tablet 11   Multiple Vitamins-Minerals (WOMENS MULTIVITAMIN PO) Take by mouth.     OVER THE COUNTER MEDICATION 2 (two) times daily. vitafusion     OVER THE COUNTER MEDICATION blood sugar synergy     rosuvastatin (CRESTOR) 5 MG tablet TAKE 1 TABLET BY MOUTH ONCE DAILY. Must keep scheduled appt w/las for future refills 30 tablet 0   SYNTHROID 125 MCG tablet Take 1 tablet by mouth once daily 90 tablet 3   No facility-administered medications prior to visit.    ROS: Review of Systems  Constitutional:  Negative for activity change, appetite change, chills, fatigue and unexpected weight change.  HENT:  Positive for congestion, postnasal drip, rhinorrhea and sinus pain. Negative for mouth sores and sinus pressure.   Eyes:  Negative for visual disturbance.   Respiratory:  Positive for cough. Negative for chest tightness.   Gastrointestinal:  Negative for abdominal pain and nausea.  Genitourinary:  Negative for difficulty urinating, frequency and vaginal pain.  Musculoskeletal:  Negative for back pain and gait problem.  Skin:  Negative for pallor and rash.  Neurological:  Negative for dizziness, tremors, weakness, numbness and headaches.  Psychiatric/Behavioral:  Negative for confusion and sleep disturbance.    Objective:  BP 140/82 (BP Location: Left Arm)    Pulse 79    Temp 98 F (36.7 C) (Oral)    Ht 5\' 3"  (1.6 m)    Wt 152 lb 9.6 oz (69.2 kg)    SpO2 95%    BMI 27.03 kg/m   BP Readings from Last 3 Encounters:  07/12/21 140/82  03/15/21 118/70  07/31/20 (!) 147/91    Wt Readings from Last 3 Encounters:  07/12/21 152 lb 9.6 oz (69.2 kg)  03/15/21 153 lb 3.2 oz (69.5 kg)  11/23/19 150 lb (68 kg)    Physical Exam Constitutional:      General: She is not in acute distress.    Appearance: She is well-developed.  HENT:     Head: Normocephalic.     Right Ear: External ear normal.     Left Ear: External ear normal.     Nose: Nose normal.  Eyes:     General:  Right eye: No discharge.        Left eye: No discharge.     Conjunctiva/sclera: Conjunctivae normal.     Pupils: Pupils are equal, round, and reactive to light.  Neck:     Thyroid: No thyromegaly.     Vascular: No JVD.     Trachea: No tracheal deviation.  Cardiovascular:     Rate and Rhythm: Normal rate and regular rhythm.     Heart sounds: Normal heart sounds.  Pulmonary:     Effort: No respiratory distress.     Breath sounds: No stridor. No wheezing.  Abdominal:     General: Bowel sounds are normal. There is no distension.     Palpations: Abdomen is soft. There is no mass.     Tenderness: There is no abdominal tenderness. There is no guarding or rebound.  Musculoskeletal:        General: No tenderness.     Cervical back: Normal range of motion and neck  supple. No rigidity.  Lymphadenopathy:     Cervical: No cervical adenopathy.  Skin:    Findings: No erythema or rash.  Neurological:     Cranial Nerves: No cranial nerve deficit.     Motor: No abnormal muscle tone.     Coordination: Coordination normal.     Deep Tendon Reflexes: Reflexes normal.  Psychiatric:        Behavior: Behavior normal.        Thought Content: Thought content normal.        Judgment: Judgment normal.   Hoarse  Lab Results  Component Value Date   WBC 6.9 07/22/2019   HGB 11.8 (L) 07/22/2019   HCT 35.9 (L) 07/22/2019   PLT 339.0 07/22/2019   GLUCOSE 121 (H) 07/22/2019   CHOL 192 07/22/2019   TRIG 159.0 (H) 07/22/2019   HDL 40.30 07/22/2019   LDLDIRECT 116.0 03/01/2016   LDLCALC 120 (H) 07/22/2019   ALT 16 07/22/2019   AST 16 07/22/2019   NA 138 07/22/2019   K 4.2 07/22/2019   CL 105 07/22/2019   CREATININE 0.75 07/22/2019   BUN 15 07/22/2019   CO2 28 07/22/2019   TSH 1.28 07/22/2019   HGBA1C 6.1 02/03/2010    MM 3D SCREEN BREAST BILATERAL  Result Date: 02/14/2021 CLINICAL DATA:  Screening. EXAM: DIGITAL SCREENING BILATERAL MAMMOGRAM WITH TOMOSYNTHESIS AND CAD TECHNIQUE: Bilateral screening digital craniocaudal and mediolateral oblique mammograms were obtained. Bilateral screening digital breast tomosynthesis was performed. The images were evaluated with computer-aided detection. COMPARISON:  Previous exam(s). ACR Breast Density Category b: There are scattered areas of fibroglandular density. FINDINGS: There are no findings suspicious for malignancy. Stable RIGHT breast postsurgical changes. IMPRESSION: No mammographic evidence of malignancy. A result letter of this screening mammogram will be mailed directly to the patient. RECOMMENDATION: Screening mammogram in one year. (Code:SM-B-01Y) BI-RADS CATEGORY  2: Benign. Electronically Signed   By: Valentino Saxon MD   On: 02/14/2021 16:47    Assessment & Plan:   Problem List Items Addressed This  Visit     B12 deficiency    On Vit B12      Relevant Orders   Vitamin B12   Coronary atherosclerosis    On Crestor Will get lipids      Relevant Orders   TSH   Urinalysis   CBC with Differential/Platelet   Lipid panel   Comprehensive metabolic panel   History of malignant neoplasm of large intestine    Check CEA  Relevant Orders   CEA   Hypothyroidism   Relevant Medications   SYNTHROID 125 MCG tablet   Other Relevant Orders   TSH   Lipid panel   Comprehensive metabolic panel   Upper respiratory infection - Primary   Relevant Medications   azithromycin (ZITHROMAX Z-PAK) 250 MG tablet   Other Visit Diagnoses     Vitamin D deficiency       Relevant Orders   VITAMIN D 25 Hydroxy (Vit-D Deficiency, Fractures)         Meds ordered this encounter  Medications   promethazine-dextromethorphan (PROMETHAZINE-DM) 6.25-15 MG/5ML syrup    Sig: Take 5 mLs by mouth 4 (four) times daily as needed for cough.    Dispense:  240 mL    Refill:  0   azithromycin (ZITHROMAX Z-PAK) 250 MG tablet    Sig: As directed    Dispense:  6 tablet    Refill:  0   SYNTHROID 125 MCG tablet    Sig: Take 1 tablet (125 mcg total) by mouth daily.    Dispense:  90 tablet    Refill:  0      Follow-up: Return in about 3 months (around 10/10/2021) for Wellness Exam.  Walker Kehr, MD

## 2021-07-12 NOTE — Assessment & Plan Note (Signed)
On Vit B12 

## 2021-07-12 NOTE — Assessment & Plan Note (Signed)
Check CEA 

## 2021-07-14 ENCOUNTER — Other Ambulatory Visit: Payer: Self-pay | Admitting: Internal Medicine

## 2021-08-12 ENCOUNTER — Other Ambulatory Visit: Payer: Self-pay | Admitting: Internal Medicine

## 2021-09-16 ENCOUNTER — Other Ambulatory Visit: Payer: Self-pay | Admitting: Internal Medicine

## 2021-10-06 ENCOUNTER — Other Ambulatory Visit: Payer: Self-pay | Admitting: Internal Medicine

## 2021-10-31 DIAGNOSIS — Z0100 Encounter for examination of eyes and vision without abnormal findings: Secondary | ICD-10-CM | POA: Diagnosis not present

## 2021-10-31 DIAGNOSIS — H2513 Age-related nuclear cataract, bilateral: Secondary | ICD-10-CM | POA: Diagnosis not present

## 2021-12-29 ENCOUNTER — Other Ambulatory Visit: Payer: Self-pay | Admitting: Internal Medicine

## 2022-01-09 IMAGING — MG DIGITAL SCREENING BILAT W/ TOMO W/ CAD
8 series · 8 of 24 positions shown · non-contrast
Comparison: Previous exam(s).

CLINICAL DATA: Screening.

EXAM:
DIGITAL SCREENING BILATERAL MAMMOGRAM WITH TOMO AND CAD

[R MLO synth-2D]
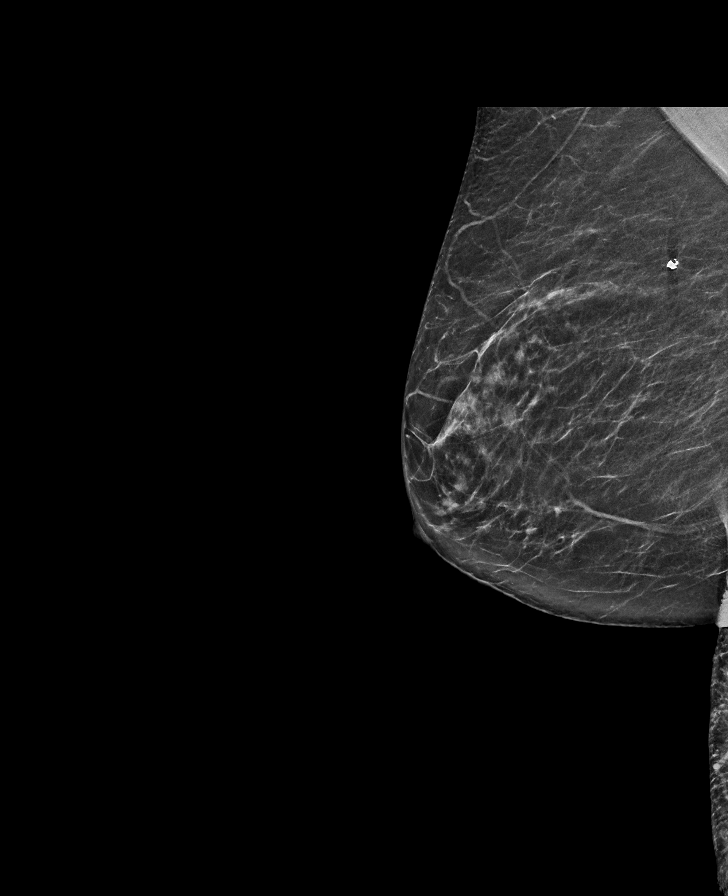

[R CC synth-2D]
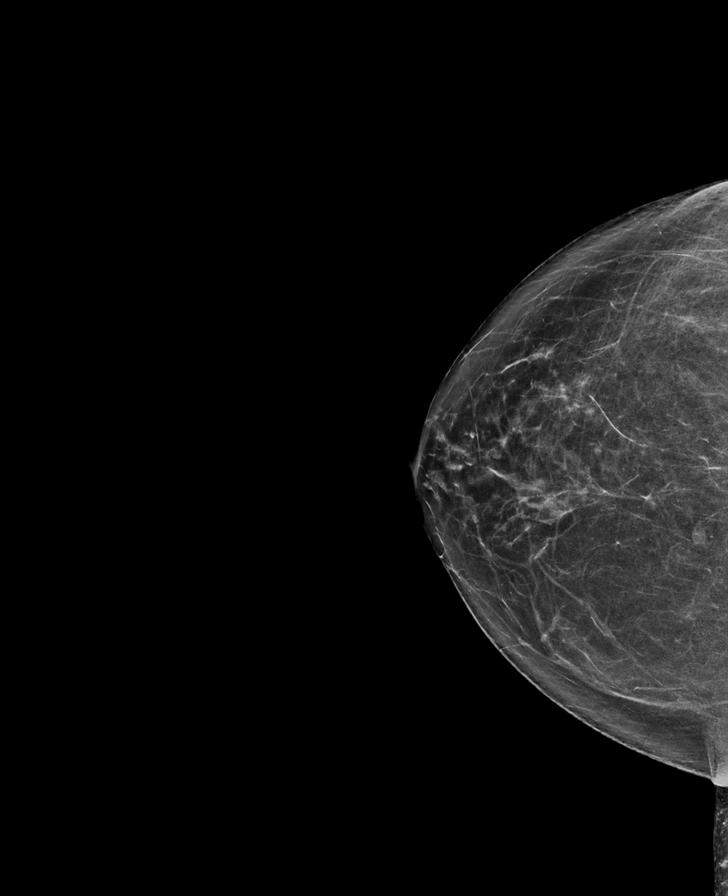

[L CC synth-2D]
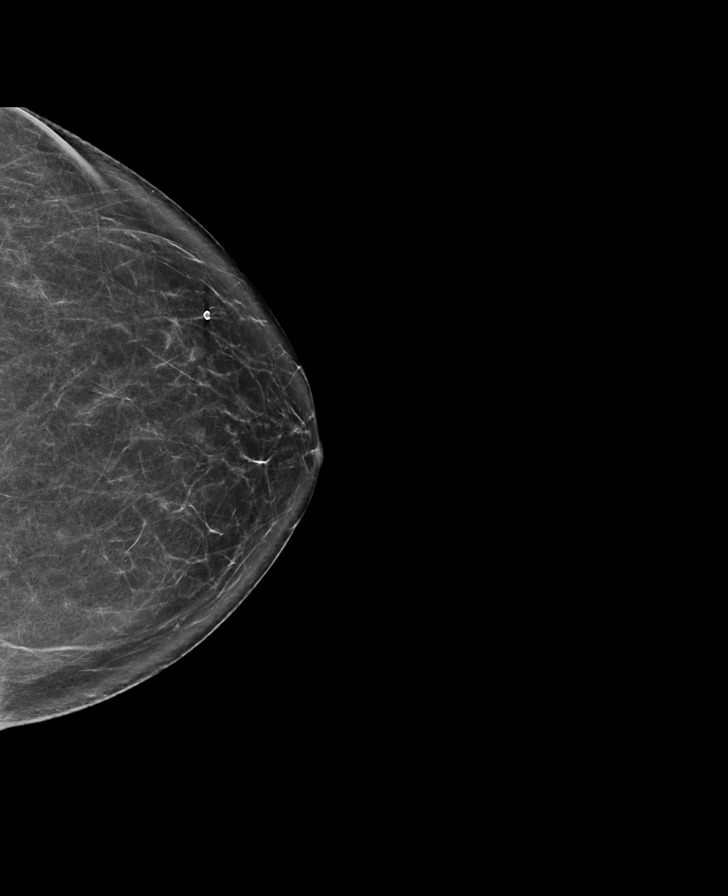

[L MLO synth-2D]
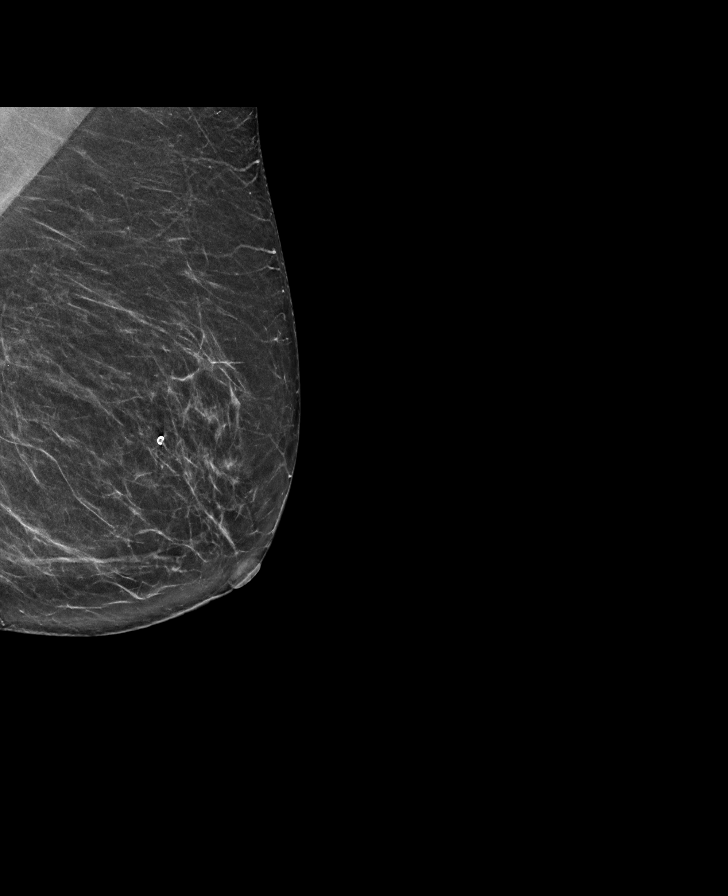

[R MLO tomo · tomo slice 33/64.0]
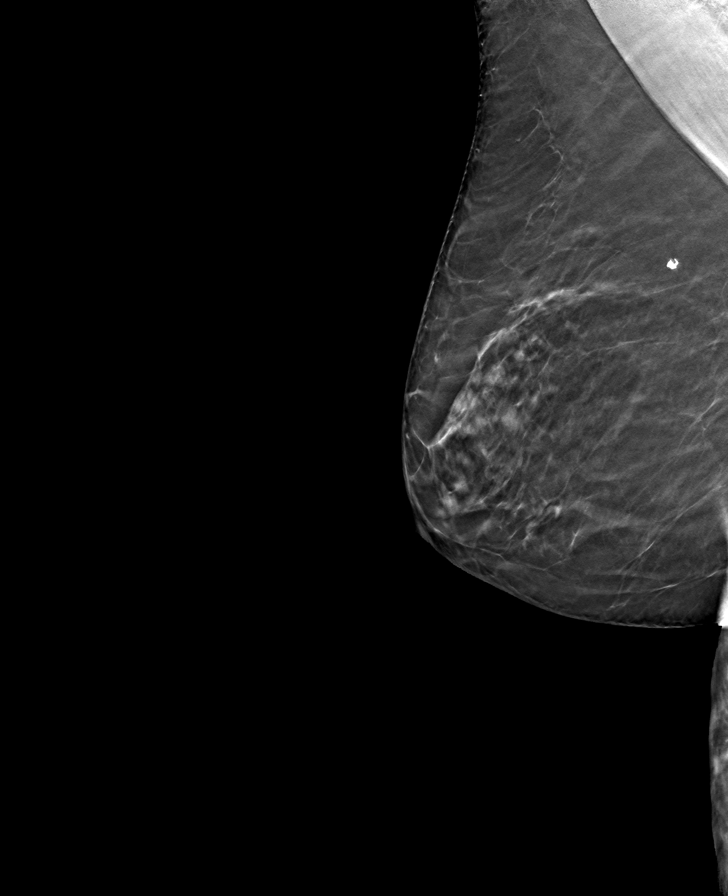

[R CC tomo · tomo slice 31/62.0]
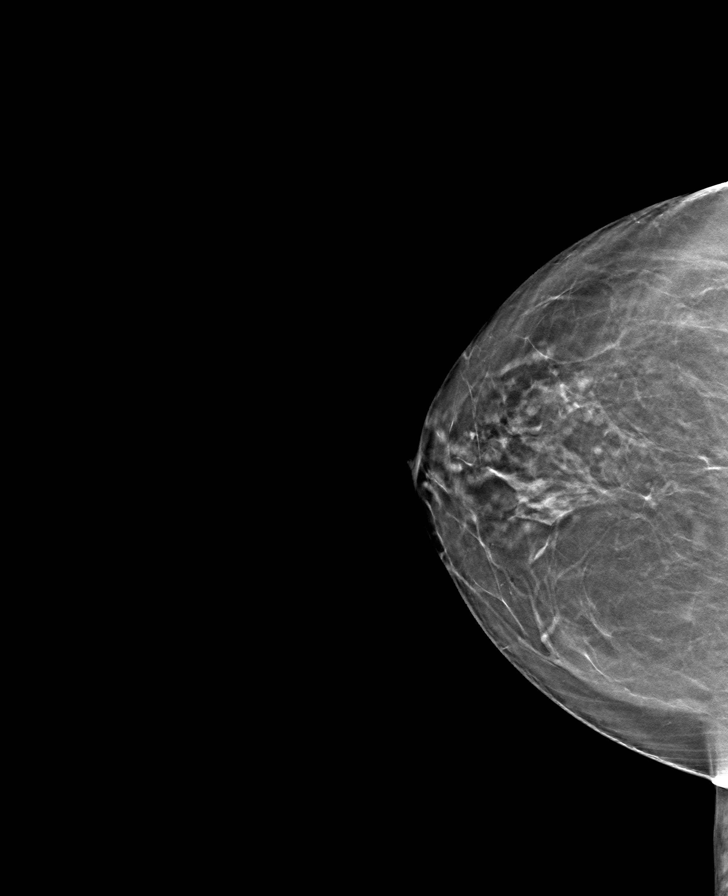

[L CC tomo · tomo slice 33/66.0]
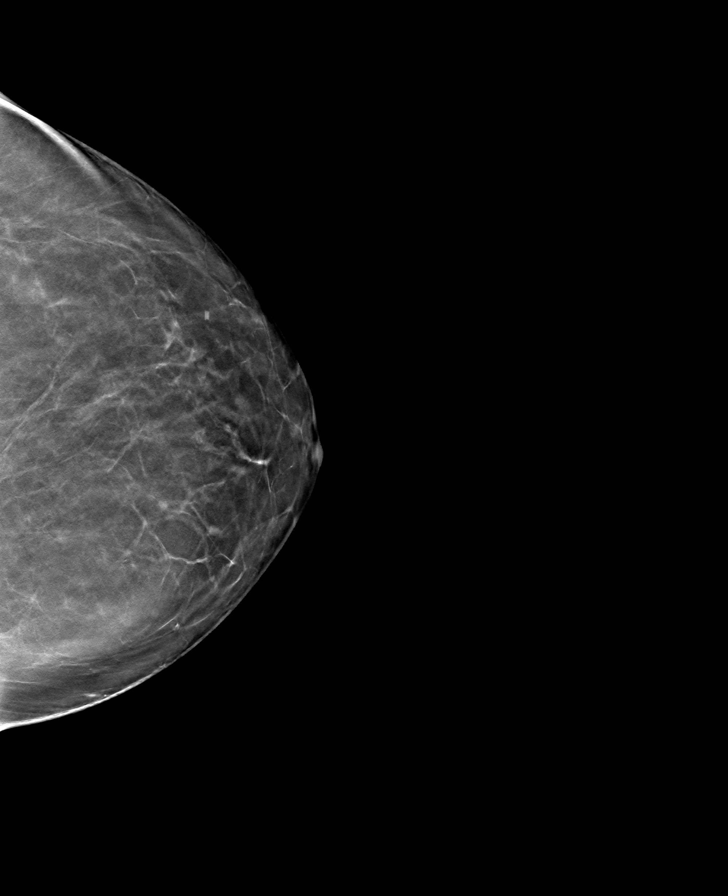

[L MLO tomo · tomo slice 33/65.0]
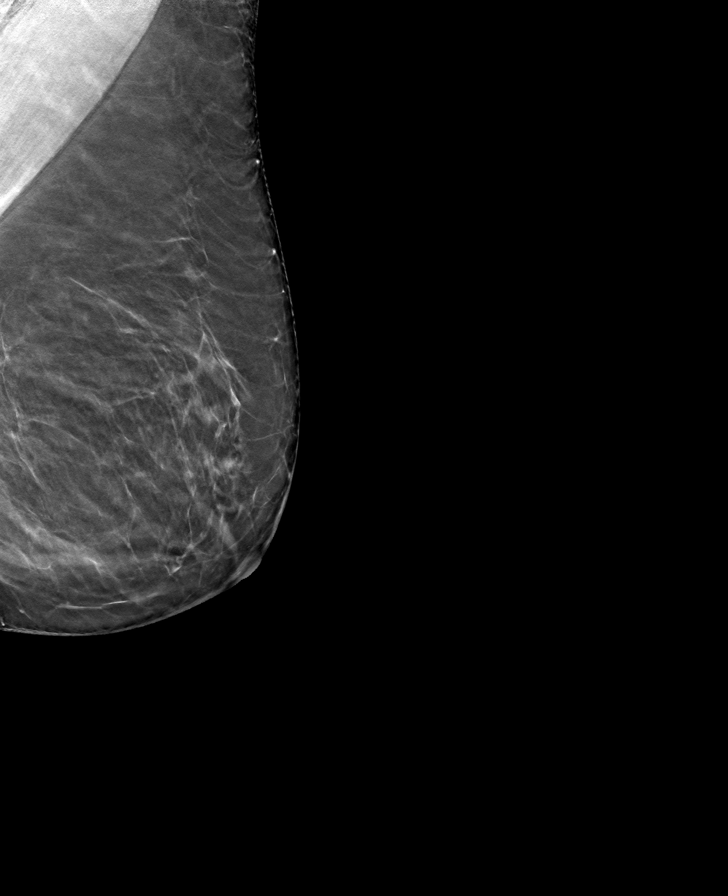

[8 of 24 positions shown; findings below may reference images not displayed]

ACR Breast Density Category b: There are scattered areas of
fibroglandular density.
FINDINGS: In the right breast, a possible mass warrants further evaluation. In
the left breast, no findings suspicious for malignancy. Images were
processed with CAD.
IMPRESSION: Further evaluation is suggested for possible mass in the right
breast.

RECOMMENDATION:
Diagnostic mammogram and possibly ultrasound of the right breast.
(Code:T1-A-550)

The patient will be contacted regarding the findings, and additional
imaging will be scheduled.

BI-RADS CATEGORY  0: Incomplete. Need additional imaging evaluation
and/or prior mammograms for comparison.

## 2022-01-12 ENCOUNTER — Other Ambulatory Visit: Payer: Self-pay | Admitting: Internal Medicine

## 2022-01-12 DIAGNOSIS — Z1231 Encounter for screening mammogram for malignant neoplasm of breast: Secondary | ICD-10-CM

## 2022-02-16 ENCOUNTER — Ambulatory Visit
Admission: RE | Admit: 2022-02-16 | Discharge: 2022-02-16 | Disposition: A | Discharge status: Home / Self Care | Payer: Medicare HMO | Source: Ambulatory Visit | Attending: Internal Medicine | Admitting: Internal Medicine

## 2022-02-16 DIAGNOSIS — Z1231 Encounter for screening mammogram for malignant neoplasm of breast: Secondary | ICD-10-CM

## 2022-03-16 ENCOUNTER — Ambulatory Visit: Payer: Medicare HMO

## 2022-03-19 ENCOUNTER — Ambulatory Visit (INDEPENDENT_AMBULATORY_CARE_PROVIDER_SITE_OTHER): Payer: Medicare HMO

## 2022-03-19 VITALS — BP 122/80 | HR 50 | Temp 97.8°F | Ht 63.0 in | Wt 155.8 lb

## 2022-03-19 DIAGNOSIS — Z0000 Encounter for general adult medical examination without abnormal findings: Secondary | ICD-10-CM

## 2022-03-19 NOTE — Patient Instructions (Signed)
Whitney [name] , Thank you for taking time to come for your Medicare Wellness Visit. I appreciate your ongoing commitment to your health goals. Please review the following plan we discussed and let me know if I can assist you in the future.   Screening recommendations/referrals: Colonoscopy: 08/27/2018; discontinued due to age Mammogram: 7//28/2023; due every year Bone Density: never done Recommended yearly ophthalmology/optometry visit for glaucoma screening and checkup Recommended yearly dental visit for hygiene and checkup  Vaccinations: Influenza vaccine: declined Pneumococcal vaccine: 12/17/2014, 03/01/2016 Tdap vaccine: 07/09/2011; due every 10 years (not covered by Medicare as preventative but will cover as treatment for an injury) Shingles vaccine: will call [facility] to verify Covid-19: declined  Advanced directives: Yes  Conditions/risks identified: Yes; Client understands the importance of follow-up appointments with providers by attending scheduled visits and discussed goals to eat healthier, increase physical activity 5 times a week for 30 minutes each, exercise the brain by doing stimulating brain exercises (reading, adult coloring, crafting, listening to music, puzzles, etc.), socialize and enjoy life more, get enough sleep at least 8-9 hours average per night and make time for laughter.  Next appointment: Please schedule your next Medicare Wellness Visit with your Nurse Health Advisor in 1 year by calling [phone].  Preventive Care 79 Years and Older, Female Preventive care refers to lifestyle choices and visits with your health care provider that can promote health and wellness. What does preventive care include? A yearly physical exam. This is also called an annual well check. Dental exams once or twice a year. Routine eye exams. Ask your health care provider how often you should have your eyes checked. Personal lifestyle choices, including: Daily care of your teeth and  gums. Regular physical activity. Eating a healthy diet. Avoiding tobacco and drug use. Limiting alcohol use. Practicing safe sex. Taking low-dose aspirin every day. Taking vitamin and mineral supplements as recommended by your health care provider. What happens during an annual well check? The services and screenings done by your health care provider during your annual well check will depend on your age, overall health, lifestyle risk factors, and family history of disease. Counseling  Your health care provider may ask you questions about your: Alcohol use. Tobacco use. Drug use. Emotional well-being. Home and relationship well-being. Sexual activity. Eating habits. History of falls. Memory and ability to understand (cognition). Work and work Statistician. Reproductive health. Screening  You may have the following tests or measurements: Height, weight, and BMI. Blood pressure. Lipid and cholesterol levels. These may be checked every 5 years, or more frequently if you are over 110 years old. Skin check. Lung cancer screening. You may have this screening every year starting at age 74 if you have a 30-pack-year history of smoking and currently smoke or have quit within the past 15 years. Fecal occult blood test (FOBT) of the stool. You may have this test every year starting at age 79. Flexible sigmoidoscopy or colonoscopy. You may have a sigmoidoscopy every 5 years or a colonoscopy every 10 years starting at age 22. Hepatitis C blood test. Hepatitis B blood test. Sexually transmitted disease (STD) testing. Diabetes screening. This is done by checking your blood sugar (glucose) after you have not eaten for a while (fasting). You may have this done every 1-3 years. Bone density scan. This is done to screen for osteoporosis. You may have this done starting at age 34. Mammogram. This may be done every 1-2 years. Talk to your health care provider about how often you should  have regular  mammograms. Talk with your health care provider about your test results, treatment options, and if necessary, the need for more tests. Vaccines  Your health care provider may recommend certain vaccines, such as: Influenza vaccine. This is recommended every year. Tetanus, diphtheria, and acellular pertussis (Tdap, Td) vaccine. You may need a Td booster every 10 years. Zoster vaccine. You may need this after age 79. Pneumococcal 13-valent conjugate (PCV13) vaccine. One dose is recommended after age 41. Pneumococcal polysaccharide (PPSV23) vaccine. One dose is recommended after age 77. Talk to your health care provider about which screenings and vaccines you need and how often you need them. This information is not intended to replace advice given to you by your health care provider. Make sure you discuss any questions you have with your health care provider. Document Released: 08/05/2015 Document Revised: 03/28/2016 Document Reviewed: 05/10/2015 Elsevier Interactive Patient Education  2017 [employer] Prevention in the Home Falls can cause injuries. They can happen to people of all ages. There are many things you can do to make your home safe and to help prevent falls. What can I do on the outside of my home? Regularly fix the edges of walkways and driveways and fix any cracks. Remove anything that might make you trip as you walk through a door, such as a raised step or threshold. Trim any bushes or trees on the path to your home. Use bright outdoor lighting. Clear any walking paths of anything that might make someone trip, such as rocks or tools. Regularly check to see if handrails are loose or broken. Make sure that both sides of any steps have handrails. Any raised decks and porches should have guardrails on the edges. Have any leaves, snow, or ice cleared regularly. Use sand or salt on walking paths during winter. Clean up any spills in your garage right away. This includes oil  or grease spills. What can I do in the bathroom? Use night lights. Install grab bars by the toilet and in the tub and shower. Do not use towel bars as grab bars. Use non-skid mats or decals in the tub or shower. If you need to sit down in the shower, use a plastic, non-slip stool. Keep the floor dry. Clean up any water that spills on the floor as soon as it happens. Remove soap buildup in the tub or shower regularly. Attach bath mats securely with double-sided non-slip rug tape. Do not have throw rugs and other things on the floor that can make you trip. What can I do in the bedroom? Use night lights. Make sure that you have a light by your bed that is easy to reach. Do not use any sheets or blankets that are too big for your bed. They should not hang down onto the floor. Have a firm chair that has side arms. You can use this for support while you get dressed. Do not have throw rugs and other things on the floor that can make you trip. What can I do in the kitchen? Clean up any spills right away. Avoid walking on wet floors. Keep items that you use a lot in easy-to-reach places. If you need to reach something above you, use a strong step stool that has a grab bar. Keep electrical cords out of the way. Do not use floor polish or wax that makes floors slippery. If you must use wax, use non-skid floor wax. Do not have throw rugs and other things on the floor  that can make you trip. What can I do with my stairs? Do not leave any items on the stairs. Make sure that there are handrails on both sides of the stairs and use them. Fix handrails that are broken or loose. Make sure that handrails are as long as the stairways. Check any carpeting to make sure that it is firmly attached to the stairs. Fix any carpet that is loose or worn. Avoid having throw rugs at the top or bottom of the stairs. If you do have throw rugs, attach them to the floor with carpet tape. Make sure that you have a light  switch at the top of the stairs and the bottom of the stairs. If you do not have them, ask someone to add them for you. What else can I do to help prevent falls? Wear shoes that: Do not have high heels. Have rubber bottoms. Are comfortable and fit you well. Are closed at the toe. Do not wear sandals. If you use a stepladder: Make sure that it is fully opened. Do not climb a closed stepladder. Make sure that both sides of the stepladder are locked into place. Ask someone to hold it for you, if possible. Clearly mark and make sure that you can see: Any grab bars or handrails. First and last steps. Where the edge of each step is. Use tools that help you move around (mobility aids) if they are needed. These include: Canes. Walkers. Scooters. Crutches. Turn on the lights when you go into a dark area. Replace any light bulbs as soon as they burn out. Set up your furniture so you have a clear path. Avoid moving your furniture around. If any of your floors are uneven, fix them. If there are any pets around you, be aware of where they are. Review your medicines with your doctor. Some medicines can make you feel dizzy. This can increase your chance of falling. Ask your doctor what other things that you can do to help prevent falls. This information is not intended to replace advice given to you by your health care provider. Make sure you discuss any questions you have with your health care provider. Document Released: 05/05/2009 Document Revised: 12/15/2015 Document Reviewed: 08/13/2014 Elsevier Interactive Patient Education  2017 [employer].

## 2022-03-19 NOTE — Progress Notes (Addendum)
Subjective:   Whitney [name] is a 81 y.o. female who presents for Medicare Annual (Subsequent) preventive examination.  Review of Systems     Cardiac Risk Factors include: advanced age (>35mn, >>65women);dyslipidemia;family history of premature cardiovascular disease;sedentary lifestyle     Objective:    Today's Vitals   03/19/22 1341  BP: 122/80  Pulse: (!) 50  Temp: 97.8 F (36.6 C)  SpO2: 99%  Weight: 155 lb 12.8 oz (70.7 kg)  Height: '5\' 3"'$  (1.6 m)  PainSc: 0-No pain   Body mass index is 27.6 kg/m.     03/19/2022    2:02 PM 03/15/2021    3:34 PM 03/26/2020   11:53 AM  Advanced Directives  Does Patient Have a Medical Advance Directive? Yes Yes Yes  Type of [place] will Living will;Healthcare Power of Attorney   Does patient want to make changes to medical advance directive? No - Patient declined    Copy of [place] Chart? No - copy requested No - copy requested     Current Medications (verified) Outpatient Encounter Medications as of 03/19/2022  Medication Sig   acetaminophen (TYLENOL) 500 MG tablet Take 500 mg by mouth every 6 (six) hours as needed.     aspirin 81 MG tablet Take 81 mg by mouth daily.   azithromycin (ZITHROMAX Z-PAK) 250 MG tablet As directed   cetirizine (ZYRTEC) 10 MG tablet TAKE 1 TABLET BY MOUTH EVERY DAY   Cholecalciferol (VITAMIN D3) 50 MCG (2000 UT) capsule Take 1 capsule (2,000 Units total) by mouth daily.   Cyanocobalamin (VITAMIN B-12) 1000 MCG SUBL Place 1 tablet (1,000 mcg total) under the tongue daily.   FLUoxetine (PROZAC) 10 MG capsule Take 1 capsule (10 mg total) by mouth daily. Must keep appt for future reills   ipratropium (ATROVENT) 0.03 % nasal spray INSTILL 2 SPRAYS INTO EACH NOSTRIL TWICE A DAY   loratadine (CLARITIN) 10 MG tablet Take 1 tablet (10 mg total) by mouth daily.   Multiple Vitamins-Minerals (WOMENS MULTIVITAMIN PO) Take by mouth.   OVER THE  COUNTER MEDICATION 2 (two) times daily. vitafusion   OVER THE COUNTER MEDICATION blood sugar synergy   promethazine-dextromethorphan (PROMETHAZINE-DM) 6.25-15 MG/5ML syrup Take 5 mLs by mouth 4 (four) times daily as needed for cough.   rosuvastatin (CRESTOR) 5 MG tablet TAKE 1 TABLET BY MOUTH ONCE DAILY   SYNTHROID 125 MCG tablet Take 1 tablet by mouth once daily   No facility-administered encounter medications on file as of 03/19/2022.    Allergies (verified) Codeine phosphate and Penicillins   History: Past Medical History:  Diagnosis Date   Anemia    Iron def   B12 deficiency    Cancer (HCC)    colon ca   Depression    History of colon cancer 2008   Hypothyroidism    Uterine polyp    Past Surgical History:  Procedure Laterality Date   BREAST BIOPSY Right 10/08/1995   Benign Excisional Biopsy   CHOLECYSTECTOMY     COLON SURGERY  2008   Resection   Family History  Problem Relation Age of Onset   Hypertension Mother    Heart disease Mother 785      CHF   Heart disease Father 480      MI died   Heart disease Other        CAD and CHF   Breast cancer Cousin    Social History  Socioeconomic History   Marital status: Widowed    Spouse name: Not on file   Number of children: Not on file   Years of education: Not on file   Highest education level: Not on file  Occupational History   Not on file  Tobacco Use   Smoking status: Former   Smokeless tobacco: Never  Substance and Sexual Activity   Alcohol use: No   Drug use: No   Sexual activity: Yes  Other Topics Concern   Not on file  Social History Narrative   Not on file   Social Determinants of Health   Financial Resource Strain: Low Risk  (03/19/2022)   Overall Financial Resource Strain (CARDIA)    Difficulty of Paying Living Expenses: Not hard at all  Food Insecurity: No Food Insecurity (03/19/2022)   Hunger Vital Sign    Worried About Running Out of Food in the Last Year: Never true    [place]  in the Last Year: Never true  Transportation Needs: No Transportation Needs (03/19/2022)   PRAPARE - Hydrologist (Medical): No    Lack of Transportation (Non-Medical): No  Physical Activity: Sufficiently Active (03/19/2022)   Exercise Vital Sign    Days of Exercise per Week: 5 days    Minutes of Exercise per Session: 30 min  Stress: No Stress Concern Present (03/19/2022)   [place]    Feeling of Stress : Not at all  Social Connections: Moderately Integrated (03/19/2022)   Social Connection and Isolation Panel [NHANES]    Frequency of Communication with Friends and Family: More than three times a week    Frequency of Social Gatherings with Friends and Family: Twice a week    Attends Religious Services: More than 4 times per year    Active Member of [employer] or Organizations: Yes    Attends Archivist Meetings: More than 4 times per year    Marital Status: Widowed    Tobacco Counseling Counseling given: Not Answered   Clinical Intake:  Pre-visit preparation completed: Yes  Pain : No/denies pain Pain Score: 0-No pain     BMI - recorded: 27.6 Nutritional Status: BMI 25 -29 Overweight Nutritional Risks: None Diabetes: No  How often do you need to have someone help you when you read instructions, pamphlets, or other written materials from your doctor or pharmacy?: 1 - Never What is the last grade level you completed in school?: HSG; 2 years of college  Diabetic? no  Interpreter Needed?: No  Information entered by :: [name], LPN.   Activities of Daily Living    03/19/2022    2:14 PM  In your present state of health, do you have any difficulty performing the following activities:  Hearing? 0  Vision? 0  Difficulty concentrating or making decisions? 0  Walking or climbing stairs? 0  Dressing or bathing? 0  Doing errands, shopping? 0  Preparing Food and  eating ? N  Using the Toilet? N  In the past six months, have you accidently leaked urine? N  Do you have problems with loss of bowel control? N  Managing your Medications? N  Managing your Finances? N  Housekeeping or managing your Housekeeping? N    Patient Care Team: Plotnikov, Evie Lacks, MD as PCP - General Louretta Shorten, MD as Attending Physician (Obstetrics and Gynecology) Clarene Essex, MD (Gastroenterology) Odogwu, Genevie Cheshire, MD (Hematology and Oncology)  Indicate any  recent Medical Services you may have received from other than [facility] providers in the past year (date may be approximate).     Assessment:   This is a routine wellness examination for Whitney [name].  Hearing/Vision screen Hearing Screening - Comments:: Patient denied any hearing difficulty.   No hearing aids.   Vision Screening - Comments:: Patient does wear corrective lenses/contacts.  Eye exam done by: Vena Austria, OD.   Dietary issues and exercise activities discussed: Current Exercise Habits: The patient does not participate in regular exercise at present, Exercise limited by: None identified   Goals Addressed             This Visit's Progress    DIET - INCREASE WATER INTAKE       Lose 10 pounds.      Depression Screen    03/19/2022    1:48 PM 03/15/2021    3:32 PM 03/01/2016   10:26 AM  PHQ 2/9 Scores  PHQ - 2 Score 0 0 0    Fall Risk    03/19/2022    2:03 PM 03/15/2021    3:36 PM 09/15/2018   11:16 AM 05/22/2017   10:40 AM 03/01/2016   10:26 AM  Fall Risk   Falls in the past year? 0 0 0 No No  Number falls in past yr: 0 0     Injury with Fall? 0 0     Risk for fall due to : No Fall Risks No Fall Risks     Follow up Falls evaluation completed Falls evaluation completed       [place]:  Any stairs in or around the home? No  If so, are there any without handrails? No  Home free of loose throw rugs in walkways, pet beds, electrical cords, etc? Yes   Adequate lighting in your home to reduce risk of falls? Yes   ASSISTIVE DEVICES UTILIZED TO PREVENT FALLS:  Life alert? No  Use of a cane, walker or w/c? No  Grab bars in the bathroom? Yes  Shower chair or bench in shower? Yes  Elevated toilet seat or a handicapped toilet? Yes   TIMED UP AND GO:  Was the test performed? Yes .  Length of time to ambulate 10 feet: 6 sec.   Gait steady and fast without use of assistive device  Cognitive Function:        03/19/2022    1:50 PM  6CIT Screen  What Year? 0 points  What month? 0 points  What time? 0 points  Count back from 20 0 points  Months in reverse 0 points  Repeat phrase 0 points  Total Score 0 points    Immunizations Immunization History  Administered Date(s) Administered   Influenza Whole 05/07/2008, 05/21/2008, 05/05/2009   Pneumococcal Conjugate-13 12/17/2014   Pneumococcal Polysaccharide-23 05/21/2008, 03/01/2016   Tdap 07/09/2011   Zoster, Live 08/01/2014    TDAP status: Due, Education has been provided regarding the importance of this vaccine. Advised may receive this vaccine at local pharmacy or Health Dept. Aware to provide a copy of the vaccination record if obtained from local pharmacy or Health Dept. Verbalized acceptance and understanding.  Flu Vaccine status: Declined, Education has been provided regarding the importance of this vaccine but patient still declined. Advised may receive this vaccine at local pharmacy or Health Dept. Aware to provide a copy of the vaccination record if obtained from local pharmacy or Health Dept. Verbalized acceptance and understanding.  Pneumococcal  vaccine status: Up to date  Covid-19 vaccine status: Declined, Education has been provided regarding the importance of this vaccine but patient still declined. Advised may receive this vaccine at local pharmacy or Health Dept.or vaccine clinic. Aware to provide a copy of the vaccination record if obtained from local pharmacy or  Health Dept. Verbalized acceptance and understanding.  Qualifies for Shingles Vaccine? Yes   Zostavax completed Yes   Shingrix Completed?: No.    Education has been provided regarding the importance of this vaccine. Patient has been advised to call insurance company to determine out of pocket expense if they have not yet received this vaccine. Advised may also receive vaccine at local pharmacy or Health Dept. Verbalized acceptance and understanding. (No record with [employer])  Screening Tests Health Maintenance  Topic Date Due   Hepatitis C Screening  Never done   Zoster Vaccines- Shingrix (1 of 2) Never done   DEXA SCAN  Never done   TETANUS/TDAP  07/08/2021   Pneumonia Vaccine 39+ Years old  Completed   HPV VACCINES  Aged Out   INFLUENZA VACCINE  Discontinued   COVID-19 Vaccine  Discontinued    Health Maintenance  Health Maintenance Due  Topic Date Due   Hepatitis C Screening  Never done   Zoster Vaccines- Shingrix (1 of 2) Never done   DEXA SCAN  Never done   TETANUS/TDAP  07/08/2021    Colorectal cancer screening: No longer required.   Mammogram status: Completed 02/16/2022. Repeat every year  Bone Density status: never done  Lung Cancer Screening: (Low Dose CT Chest recommended if Age 72-80 years, 30 pack-year currently smoking OR have quit w/in 15years.) does not qualify.   Lung Cancer Screening Referral: no  Additional Screening:  Hepatitis C Screening: does qualify; Completed no  Vision Screening: Recommended annual ophthalmology exams for early detection of glaucoma and other disorders of the eye. Is the patient up to date with their annual eye exam?  Yes  Who is the provider or what is the name of the office in which the patient attends annual eye exams? Vena Austria, OD with [facility] If pt is not established with a provider, would they like to be referred to a provider to establish care? No .   Dental Screening: Recommended annual dental exams for proper  oral hygiene  Community Resource Referral / Chronic Care Management: CRR required this visit?  No   CCM required this visit?  No      Plan:     I have personally reviewed and noted the following in the patient's chart:   Medical and social history Use of alcohol, tobacco or illicit drugs  Current medications and supplements including opioid prescriptions. Patient is not currently taking opioid prescriptions. Functional ability and status Nutritional status Physical activity Advanced directives List of other physicians Hospitalizations, surgeries, and ER visits in previous 12 months Vitals Screenings to include cognitive, depression, and falls Referrals and appointments  In addition, I have reviewed and discussed with patient certain preventive protocols, quality metrics, and best practice recommendations. A written personalized care plan for preventive services as well as general preventive health recommendations were provided to patient.     Sheral Flow, LPN   [id number]   Nurse Notes:  Normal cognitive status assessed by direct observation by this Nurse Health Advisor. No abnormalities found.  Hearing Screening - Comments:: Patient denied any hearing difficulty.   No hearing aids.   Vision Screening - Comments:: Patient does wear corrective lenses/contacts.  Eye exam done by: Vena Austria, OD.    Medical screening examination/treatment/procedure(s) were performed by non-physician practitioner and as supervising physician I was immediately available for consultation/collaboration.  I agree with above. Lew Dawes, MD

## 2022-07-06 ENCOUNTER — Other Ambulatory Visit: Payer: Self-pay | Admitting: Internal Medicine

## 2022-07-14 ENCOUNTER — Other Ambulatory Visit: Payer: Self-pay | Admitting: Internal Medicine

## 2022-09-13 ENCOUNTER — Other Ambulatory Visit: Payer: Self-pay | Admitting: Internal Medicine

## 2022-12-06 DIAGNOSIS — R062 Wheezing: Secondary | ICD-10-CM | POA: Diagnosis not present

## 2022-12-06 DIAGNOSIS — J069 Acute upper respiratory infection, unspecified: Secondary | ICD-10-CM | POA: Diagnosis not present

## 2022-12-08 ENCOUNTER — Other Ambulatory Visit: Payer: Self-pay | Admitting: Internal Medicine

## 2022-12-11 ENCOUNTER — Encounter: Payer: Self-pay | Admitting: Internal Medicine

## 2022-12-11 ENCOUNTER — Ambulatory Visit (INDEPENDENT_AMBULATORY_CARE_PROVIDER_SITE_OTHER): Payer: Medicare HMO | Admitting: Internal Medicine

## 2022-12-11 VITALS — BP 130/76 | HR 72 | Temp 98.2°F | Ht 63.0 in | Wt 161.0 lb

## 2022-12-11 DIAGNOSIS — D500 Iron deficiency anemia secondary to blood loss (chronic): Secondary | ICD-10-CM

## 2022-12-11 DIAGNOSIS — R739 Hyperglycemia, unspecified: Secondary | ICD-10-CM | POA: Diagnosis not present

## 2022-12-11 DIAGNOSIS — R030 Elevated blood-pressure reading, without diagnosis of hypertension: Secondary | ICD-10-CM | POA: Diagnosis not present

## 2022-12-11 DIAGNOSIS — K219 Gastro-esophageal reflux disease without esophagitis: Secondary | ICD-10-CM | POA: Insufficient documentation

## 2022-12-11 DIAGNOSIS — J069 Acute upper respiratory infection, unspecified: Secondary | ICD-10-CM | POA: Diagnosis not present

## 2022-12-11 DIAGNOSIS — E034 Atrophy of thyroid (acquired): Secondary | ICD-10-CM

## 2022-12-11 DIAGNOSIS — E538 Deficiency of other specified B group vitamins: Secondary | ICD-10-CM

## 2022-12-11 DIAGNOSIS — Z0000 Encounter for general adult medical examination without abnormal findings: Secondary | ICD-10-CM

## 2022-12-11 DIAGNOSIS — K921 Melena: Secondary | ICD-10-CM | POA: Insufficient documentation

## 2022-12-11 DIAGNOSIS — Z8249 Family history of ischemic heart disease and other diseases of the circulatory system: Secondary | ICD-10-CM | POA: Diagnosis not present

## 2022-12-11 DIAGNOSIS — K5900 Constipation, unspecified: Secondary | ICD-10-CM | POA: Insufficient documentation

## 2022-12-11 MED ORDER — SYNTHROID 125 MCG PO TABS: 125.0000 ug | ORAL_TABLET | Freq: Every day | ORAL | 3 refills | Status: DC

## 2022-12-11 MED ORDER — MEGARED OMEGA-3 KRILL OIL 500 MG PO CAPS: 1.0000 | ORAL_CAPSULE | Freq: Every morning | ORAL | 3 refills | Status: AC

## 2022-12-11 MED ORDER — AZITHROMYCIN 250 MG PO TABS: ORAL_TABLET | ORAL | 0 refills | Status: DC

## 2022-12-11 MED ORDER — ROSUVASTATIN CALCIUM 5 MG PO TABS: 5.0000 mg | ORAL_TABLET | Freq: Every day | ORAL | 3 refills | Status: DC

## 2022-12-11 NOTE — Assessment & Plan Note (Signed)
  BP nl at home 

## 2022-12-11 NOTE — Assessment & Plan Note (Signed)
On Vit B12 

## 2022-12-11 NOTE — Assessment & Plan Note (Signed)
On Levothroid 

## 2022-12-11 NOTE — Assessment & Plan Note (Signed)
Check CBC 

## 2022-12-11 NOTE — Assessment & Plan Note (Signed)
  We discussed age appropriate health related issues, including available/recomended screening tests and vaccinations. Labs were ordered to be later reviewed . All questions were answered. We discussed one or more of the following - seat belt use, use of sunscreen/sun exposure exercise, fall risk reduction, second hand smoke exposure, firearm use and storage, seat belt use, a need for adhering to healthy diet and exercise. Labs were ordered.  All questions were answered.  Colon 2016 Dr Ewing Schlein

## 2022-12-11 NOTE — Assessment & Plan Note (Addendum)
2021 Coronary calcium CT score of 296  Re-start Krill oil ASA 81 mg EC On Crestor

## 2022-12-11 NOTE — Assessment & Plan Note (Signed)
Z pack Rx 

## 2022-12-11 NOTE — Progress Notes (Signed)
Subjective:  Patient ID: Whitney [name], female    DOB: 12-Jun-1941  Age: 82 y.o. MRN: [id number]  CC: Blood Pressure Check   HPI Whitney [name] presents for a well exam F/u hypothyroidism  Outpatient Medications Prior to Visit  Medication Sig Dispense Refill   aspirin 81 MG tablet Take 81 mg by mouth daily.     cetirizine (ZYRTEC) 10 MG tablet TAKE 1 TABLET BY MOUTH EVERY DAY 30 tablet 5   Cholecalciferol (VITAMIN D3) 50 MCG (2000 UT) capsule Take 1 capsule (2,000 Units total) by mouth daily. 100 capsule 3   Cyanocobalamin (VITAMIN B-12) 1000 MCG SUBL Place 1 tablet (1,000 mcg total) under the tongue daily. 100 tablet 3   ipratropium (ATROVENT) 0.03 % nasal spray INSTILL 2 SPRAYS INTO EACH NOSTRIL TWICE A DAY 30 mL 5   loratadine (CLARITIN) 10 MG tablet Take 1 tablet (10 mg total) by mouth daily. 30 tablet 11   acetaminophen (TYLENOL) 500 MG tablet Take 500 mg by mouth every 6 (six) hours as needed.       FLUoxetine (PROZAC) 10 MG capsule Take 1 capsule (10 mg total) by mouth daily. Must keep appt for future reills 90 capsule 3   Multiple Vitamins-Minerals (WOMENS MULTIVITAMIN PO) Take by mouth.     OVER THE COUNTER MEDICATION 2 (two) times daily. vitafusion     OVER THE COUNTER MEDICATION blood sugar synergy     rosuvastatin (CRESTOR) 5 MG tablet Take 1 tablet by mouth once daily 90 tablet 0   SYNTHROID 125 MCG tablet Take 1 tablet by mouth once daily 90 tablet 0   azithromycin (ZITHROMAX Z-PAK) 250 MG tablet As directed (Patient not taking: Reported on 12/11/2022) 6 tablet 0   promethazine-dextromethorphan (PROMETHAZINE-DM) 6.25-15 MG/5ML syrup Take 5 mLs by mouth 4 (four) times daily as needed for cough. (Patient not taking: Reported on 12/11/2022) 240 mL 0   No facility-administered medications prior to visit.    ROS: Review of Systems  Constitutional:  Negative for activity change, appetite change, chills, fatigue and unexpected weight change.  HENT:  Positive for  congestion, sinus pressure and sore throat. Negative for mouth sores.   Eyes:  Negative for visual disturbance.  Respiratory:  Negative for cough and chest tightness.   Gastrointestinal:  Negative for abdominal pain and nausea.  Genitourinary:  Negative for difficulty urinating, frequency and vaginal pain.  Musculoskeletal:  Negative for back pain and gait problem.  Skin:  Negative for pallor and rash.  Neurological:  Negative for dizziness, tremors, weakness, numbness and headaches.  Psychiatric/Behavioral:  Negative for confusion and sleep disturbance.     Objective:  BP 130/76 (BP Location: Left Arm, Patient Position: Sitting, Cuff Size: Large)   Pulse 72   Temp 98.2 F (36.8 C) (Oral)   Ht 5\' 3"  (1.6 m)   Wt 161 lb (73 kg)   SpO2 94%   BMI 28.52 kg/m   BP Readings from Last 3 Encounters:  12/11/22 130/76  03/19/22 122/80  07/12/21 140/82    Wt Readings from Last 3 Encounters:  12/11/22 161 lb (73 kg)  03/19/22 155 lb 12.8 oz (70.7 kg)  07/12/21 152 lb 9.6 oz (69.2 kg)    Physical Exam Constitutional:      General: She is not in acute distress.    Appearance: She is well-developed. She is obese.  HENT:     Head: Normocephalic.     Right Ear: External ear normal.     Left Ear:  External ear normal.     Nose: Nose normal.  Eyes:     General:        Right eye: No discharge.        Left eye: No discharge.     Conjunctiva/sclera: Conjunctivae normal.     Pupils: Pupils are equal, round, and reactive to light.  Neck:     Thyroid: No thyromegaly.     Vascular: No JVD.     Trachea: No tracheal deviation.  Cardiovascular:     Rate and Rhythm: Normal rate and regular rhythm.     Heart sounds: Normal heart sounds.  Pulmonary:     Effort: No respiratory distress.     Breath sounds: No stridor. No wheezing.  Abdominal:     General: Bowel sounds are normal. There is no distension.     Palpations: Abdomen is soft. There is no mass.     Tenderness: There is no  abdominal tenderness. There is no guarding or rebound.  Musculoskeletal:        General: No tenderness.     Cervical back: Normal range of motion and neck supple. No rigidity.  Lymphadenopathy:     Cervical: No cervical adenopathy.  Skin:    Findings: No erythema or rash.  Neurological:     Mental Status: She is oriented to person, place, and time.     Cranial Nerves: No cranial nerve deficit.     Motor: No abnormal muscle tone.     Coordination: Coordination normal.     Deep Tendon Reflexes: Reflexes normal.  Psychiatric:        Behavior: Behavior normal.        Thought Content: Thought content normal.        Judgment: Judgment normal.     Lab Results  Component Value Date   WBC 6.9 07/22/2019   HGB 11.8 (L) 07/22/2019   HCT 35.9 (L) 07/22/2019   PLT 339.0 07/22/2019   GLUCOSE 121 (H) 07/22/2019   CHOL 192 07/22/2019   TRIG 159.0 (H) 07/22/2019   HDL 40.30 07/22/2019   LDLDIRECT 116.0 03/01/2016   LDLCALC 120 (H) 07/22/2019   ALT 16 07/22/2019   AST 16 07/22/2019   NA 138 07/22/2019   K 4.2 07/22/2019   CL 105 07/22/2019   CREATININE 0.75 07/22/2019   BUN 15 07/22/2019   CO2 28 07/22/2019   TSH 1.28 07/22/2019   HGBA1C 6.1 02/03/2010    MM 3D SCREEN BREAST BILATERAL  Result Date: 02/19/2022 CLINICAL DATA:  Screening. EXAM: DIGITAL SCREENING BILATERAL MAMMOGRAM WITH TOMOSYNTHESIS AND CAD TECHNIQUE: Bilateral screening digital craniocaudal and mediolateral oblique mammograms were obtained. Bilateral screening digital breast tomosynthesis was performed. The images were evaluated with computer-aided detection. COMPARISON:  Previous exam(s). ACR Breast Density Category b: There are scattered areas of fibroglandular density. FINDINGS: There are no findings suspicious for malignancy. IMPRESSION: No mammographic evidence of malignancy. A result letter of this screening mammogram will be mailed directly to the patient. RECOMMENDATION: Screening mammogram in one year.  (Code:SM-B-01Y) BI-RADS CATEGORY  1: Negative. Electronically Signed   By: Ted Mcalpine M.D.   On: 02/19/2022 16:15    Assessment & Plan:   Problem List Items Addressed This Visit     Hypothyroidism    On Levothroid      Relevant Medications   SYNTHROID 125 MCG tablet   B12 deficiency    On Vit B12      ANEMIA-IRON DEFICIENCY    Check CBC  Well adult exam - Primary     We discussed age appropriate health related issues, including available/recomended screening tests and vaccinations. Labs were ordered to be later reviewed . All questions were answered. We discussed one or more of the following - seat belt use, use of sunscreen/sun exposure exercise, fall risk reduction, second hand smoke exposure, firearm use and storage, seat belt use, a need for adhering to healthy diet and exercise. Labs were ordered.  All questions were answered.  Colon 2016 Dr Ewing Schlein      Family history of early CAD    2021 Coronary calcium CT score of 296  Re-start Krill oil ASA 81 mg EC On Crestor      Elevated BP without diagnosis of hypertension    BP nl at home      Upper respiratory infection    Z pack Rx      Relevant Medications   azithromycin (ZITHROMAX Z-PAK) 250 MG tablet      Meds ordered this encounter  Medications   SYNTHROID 125 MCG tablet    Sig: Take 1 tablet (125 mcg total) by mouth daily.    Dispense:  90 tablet    Refill:  3   rosuvastatin (CRESTOR) 5 MG tablet    Sig: Take 1 tablet (5 mg total) by mouth daily.    Dispense:  90 tablet    Refill:  3   MegaRed Omega-3 Krill Oil 500 MG CAPS    Sig: Take 1 capsule by mouth every morning.    Dispense:  100 capsule    Refill:  3   azithromycin (ZITHROMAX Z-PAK) 250 MG tablet    Sig: As directed    Dispense:  6 tablet    Refill:  0      Follow-up: Return in about 6 months (around 06/13/2023) for a follow-up visit.  Sonda Primes, MD

## 2022-12-12 ENCOUNTER — Encounter: Payer: Self-pay | Admitting: Internal Medicine

## 2022-12-13 ENCOUNTER — Other Ambulatory Visit (INDEPENDENT_AMBULATORY_CARE_PROVIDER_SITE_OTHER): Payer: Medicare HMO

## 2022-12-13 DIAGNOSIS — R739 Hyperglycemia, unspecified: Secondary | ICD-10-CM

## 2022-12-13 DIAGNOSIS — Z0000 Encounter for general adult medical examination without abnormal findings: Secondary | ICD-10-CM | POA: Diagnosis not present

## 2022-12-13 DIAGNOSIS — E034 Atrophy of thyroid (acquired): Secondary | ICD-10-CM

## 2022-12-13 LAB — CBC WITH DIFFERENTIAL/PLATELET
Basophils Absolute: 0.1 10*3/uL (ref 0.0–0.1)
Basophils Relative: 0.7 % (ref 0.0–3.0)
Eosinophils Absolute: 0.5 10*3/uL (ref 0.0–0.7)
Eosinophils Relative: 4.7 % (ref 0.0–5.0)
HCT: 37 % (ref 36.0–46.0)
Hemoglobin: 12.1 g/dL (ref 12.0–15.0)
Lymphocytes Relative: 28.7 % (ref 12.0–46.0)
Lymphs Abs: 2.8 10*3/uL (ref 0.7–4.0)
MCHC: 32.7 g/dL (ref 30.0–36.0)
MCV: 86.3 fl (ref 78.0–100.0)
Monocytes Absolute: 0.7 10*3/uL (ref 0.1–1.0)
Monocytes Relative: 7.4 % (ref 3.0–12.0)
Neutro Abs: 5.7 10*3/uL (ref 1.4–7.7)
Neutrophils Relative %: 58.5 % (ref 43.0–77.0)
Platelets: 381 10*3/uL (ref 150.0–400.0)
RBC: 4.29 Mil/uL (ref 3.87–5.11)
RDW: 14.2 % (ref 11.5–15.5)
WBC: 9.8 10*3/uL (ref 4.0–10.5)

## 2022-12-13 LAB — COMPREHENSIVE METABOLIC PANEL
ALT: 53 U/L — ABNORMAL HIGH (ref 0–35)
AST: 31 U/L (ref 0–37)
Albumin: 3.8 g/dL (ref 3.5–5.2)
Alkaline Phosphatase: 122 U/L — ABNORMAL HIGH (ref 39–117)
BUN: 15 mg/dL (ref 6–23)
CO2: 26 mEq/L (ref 19–32)
Calcium: 9.1 mg/dL (ref 8.4–10.5)
Chloride: 102 mEq/L (ref 96–112)
Creatinine, Ser: 0.75 mg/dL (ref 0.40–1.20)
GFR: 75.21 mL/min (ref >60.00)
Glucose, Bld: 172 mg/dL — ABNORMAL HIGH (ref 70–99)
Potassium: 4.3 mEq/L (ref 3.5–5.1)
Sodium: 137 mEq/L (ref 135–145)
Total Bilirubin: 0.3 mg/dL (ref 0.2–1.2)
Total Protein: 7.1 g/dL (ref 6.0–8.3)

## 2022-12-13 LAB — URINALYSIS
Bilirubin Urine: NEGATIVE
Hgb urine dipstick: NEGATIVE
Ketones, ur: NEGATIVE
Leukocytes,Ua: NEGATIVE
Nitrite: NEGATIVE
Specific Gravity, Urine: 1.01 (ref 1.000–1.030)
Total Protein, Urine: NEGATIVE
Urine Glucose: NEGATIVE
Urobilinogen, UA: 0.2 (ref 0.0–1.0)
pH: 6 (ref 5.0–8.0)

## 2022-12-13 LAB — TSH: TSH: 0.71 u[IU]/mL (ref 0.35–5.50)

## 2022-12-13 LAB — HEMOGLOBIN A1C: Hgb A1c MFr Bld: 7.8 % — ABNORMAL HIGH (ref 4.6–6.5)

## 2022-12-13 LAB — LIPID PANEL
Cholesterol: 117 mg/dL (ref 0–200)
HDL: 36.8 mg/dL — ABNORMAL LOW (ref >39.00)
NonHDL: 80.6
Total CHOL/HDL Ratio: 3
Triglycerides: 206 mg/dL — ABNORMAL HIGH (ref 0.0–149.0)
VLDL: 41.2 mg/dL — ABNORMAL HIGH (ref 0.0–40.0)

## 2022-12-13 LAB — LDL CHOLESTEROL, DIRECT: Direct LDL: 56 mg/dL

## 2022-12-14 ENCOUNTER — Other Ambulatory Visit: Payer: Self-pay | Admitting: Internal Medicine

## 2022-12-14 DIAGNOSIS — E119 Type 2 diabetes mellitus without complications: Secondary | ICD-10-CM | POA: Insufficient documentation

## 2023-01-01 ENCOUNTER — Encounter: Payer: Medicare HMO | Attending: Internal Medicine | Admitting: Dietician

## 2023-01-01 ENCOUNTER — Encounter: Payer: Self-pay | Admitting: Dietician

## 2023-01-01 ENCOUNTER — Telehealth: Payer: Self-pay | Admitting: Internal Medicine

## 2023-01-01 VITALS — Ht 63.0 in | Wt 160.0 lb

## 2023-01-01 DIAGNOSIS — E119 Type 2 diabetes mellitus without complications: Secondary | ICD-10-CM | POA: Diagnosis not present

## 2023-01-01 MED ORDER — ACCU-CHEK SOFTCLIX LANCETS MISC: 0 refills | Status: DC

## 2023-01-01 MED ORDER — ACCU-CHEK GUIDE VI STRP: ORAL_STRIP | 0 refills | Status: DC

## 2023-01-01 NOTE — Telephone Encounter (Signed)
Sent Accu chek strips and lancets to [facility].Marland KitchenRaechel Chute

## 2023-01-01 NOTE — Telephone Encounter (Signed)
Pt has gotten an Accu-Chek Guide Me device and she needs an RX for:   10 Accu-Chek  Guide Test Strips & Accu-Chek Softclix lancing device & 10 lancets   Please send to: [facility] [phone]   Phone: [phone]  Fax: [phone]

## 2023-01-01 NOTE — Progress Notes (Addendum)
Patient was seen on 12/01/2022 for the first of a series of three diabetes self-management courses at the [facility].  Patient Education Plan per assessed needs and concerns is to attend three course education program for Diabetes Self Management Education.  A1C was 7.8 on 12/13/2022.  Patient was educated on blood glucose monitoring. Blood glucose monitor provided:  AccuChek Guide Me Lot [id number] Expiration 02/08/2024 Patient's blood glucose ws 145 three hours after breakfast.   The following learning objectives were met by the patient during this class: Describe diabetes, types of diabetes and pathophysiology State some common risk factors for diabetes Defines the role of glucose and insulin Describe the relationship between diabetes and cardiovascular and other risks State the members of the Healthcare Team States the rationale for glucose monitoring and when to test State their individual Target Range State the importance of logging glucose readings and how to interpret the readings Identifies A1C target Explain the correlation between A1c and eAG values State symptoms and treatment of high blood glucose and low blood glucose Explain proper technique for glucose testing and identify proper sharps disposal  Handouts given during class include: How to Thrive:  A Guide for Your Journey with Diabetes by the ADA Meal Plan Card and carbohydrate content list Dietary intake form Low Sodium Flavoring Tips Types of Fats Dining Out Label reading Snack list The diabetes portion plate Diabetes Resources A1c to eAG Conversion Chart Blood Glucose Log Diabetes Recommended Care Schedule Support Group Diabetes Success Plan Core Class Satisfaction Survey   Follow-Up Plan: Attend core 2

## 2023-01-04 ENCOUNTER — Telehealth: Payer: Self-pay | Admitting: Internal Medicine

## 2023-01-04 NOTE — Telephone Encounter (Signed)
Patient called and said she was recently diagnosed with Type II diabetes. She would like to know if she can get the [employer] prescribed so she can check her blood sugar. If possible, she would like it to be sent to [facility] in [place]. Best callback is [phone].

## 2023-01-07 NOTE — Telephone Encounter (Signed)
Medicare improves freestyle libre for insulin users primarily.  Thanks

## 2023-01-08 ENCOUNTER — Encounter: Payer: Self-pay | Admitting: Dietician

## 2023-01-08 ENCOUNTER — Encounter: Payer: Medicare HMO | Admitting: Dietician

## 2023-01-08 ENCOUNTER — Other Ambulatory Visit: Payer: Self-pay | Admitting: Internal Medicine

## 2023-01-08 DIAGNOSIS — E119 Type 2 diabetes mellitus without complications: Secondary | ICD-10-CM | POA: Diagnosis not present

## 2023-01-08 DIAGNOSIS — Z1231 Encounter for screening mammogram for malignant neoplasm of breast: Secondary | ICD-10-CM

## 2023-01-08 NOTE — Progress Notes (Signed)
Patient was seen on 01/08/2023 for the second of a series of three diabetes self-management courses at the [facility]. The following learning objectives were met by the patient during this class:  Describe the role of different macronutrients on glucose Explain how carbohydrates affect blood glucose State what foods contain the most carbohydrates Demonstrate carbohydrate counting Demonstrate how to read Nutrition Facts food label Describe effects of various fats on heart health Describe the importance of good nutrition for health and healthy eating strategies Describe techniques for managing your shopping, cooking and meal planning List strategies to follow meal plan when dining out Describe the effects of alcohol on glucose and how to use it safely  Goals:  Follow Diabetes Meal Plan as instructed  Aim to spread carbs evenly throughout the day  Aim for 3 meals per day and snacks as needed Include lean protein foods to meals/snacks  Monitor glucose levels as instructed by your doctor   Follow-Up Plan: Attend Core 3 Work towards following your personal food plan.  

## 2023-01-15 ENCOUNTER — Encounter: Payer: Medicare HMO | Attending: Internal Medicine | Admitting: Dietician

## 2023-01-15 ENCOUNTER — Encounter: Payer: Self-pay | Admitting: Dietician

## 2023-01-15 DIAGNOSIS — E119 Type 2 diabetes mellitus without complications: Secondary | ICD-10-CM | POA: Diagnosis not present

## 2023-01-15 NOTE — Progress Notes (Signed)
Patient was seen on 01/15/23 for the third of a series of three diabetes self-management courses at the [facility].   State the amount of activity recommended for healthy living Describe activities suitable for individual needs Identify ways to regularly incorporate activity into daily life Identify barriers to activity and ways to over come these barriers Identify diabetes medications being personally used and their primary action for lowering glucose and possible side effects Describe role of stress on blood glucose and develop strategies to address psychosocial issues Identify diabetes complications and ways to prevent them Explain how to manage diabetes during illness Evaluate success in meeting personal goal Establish 2-3 goals that they will plan to diligently work on  Goals:  I will be active 30 minutes or more 3 times a week I will eat less unhealthy fats   Your patient has identified these potential barriers to change:  none  Your patient has identified their diabetes self-care support plan as  [facility] Support Group    Plan:  Attend Support Group as desired

## 2023-01-21 ENCOUNTER — Encounter: Payer: Self-pay | Admitting: Internal Medicine

## 2023-01-21 ENCOUNTER — Other Ambulatory Visit: Payer: Self-pay | Admitting: Internal Medicine

## 2023-01-21 ENCOUNTER — Ambulatory Visit (INDEPENDENT_AMBULATORY_CARE_PROVIDER_SITE_OTHER): Payer: Medicare HMO | Admitting: Internal Medicine

## 2023-01-21 VITALS — BP 118/70 | HR 70 | Temp 97.9°F | Ht 63.0 in | Wt 153.0 lb

## 2023-01-21 DIAGNOSIS — E034 Atrophy of thyroid (acquired): Secondary | ICD-10-CM

## 2023-01-21 DIAGNOSIS — Z8249 Family history of ischemic heart disease and other diseases of the circulatory system: Secondary | ICD-10-CM | POA: Diagnosis not present

## 2023-01-21 DIAGNOSIS — E118 Type 2 diabetes mellitus with unspecified complications: Secondary | ICD-10-CM | POA: Insufficient documentation

## 2023-01-21 DIAGNOSIS — E538 Deficiency of other specified B group vitamins: Secondary | ICD-10-CM | POA: Diagnosis not present

## 2023-01-21 NOTE — Assessment & Plan Note (Signed)
On Levothroid 

## 2023-01-21 NOTE — Telephone Encounter (Signed)
Call pt to get more info pt states she was their earlier was talking w/ MD about the Accu-chek guide me was giving here a problem. Not sure if MD will change to something else

## 2023-01-21 NOTE — Assessment & Plan Note (Signed)
Re-start Krill oil On Crestor ASA 81 mg EC

## 2023-01-21 NOTE — Assessment & Plan Note (Addendum)
New onset Pt declined meds On a better diet, lost 8 lbs RTC 2 mo w/labs Metformin or Ozonic options were discussed. Pt declined meds

## 2023-01-21 NOTE — Telephone Encounter (Signed)
Patient had appointment today 01/21/23 with Dr. Posey Rea and they spoke about her test strips. She called back to say they were called ACCU-CHEK GUIDE ME. Best callback is [phone].

## 2023-01-21 NOTE — Patient Instructions (Signed)
Metformin or Ozempic

## 2023-01-21 NOTE — Progress Notes (Signed)
Subjective:  Patient ID: Whitney [name], female    DOB: 01-05-1941  Age: 82 y.o. MRN: [id number]  CC: Follow-up (DIABETES MANAGEMENT)   HPI Whitney [name] presents for a new onset DM Pt is on a better diet. She is refusing meds...  Outpatient Medications Prior to Visit  Medication Sig Dispense Refill   Accu-Chek Softclix Lancets lancets Use as instructed to blood sugar 100 each 0   albuterol (VENTOLIN HFA) 108 (90 Base) MCG/ACT inhaler SMARTSIG:1 Puff(s) Via Inhaler Every 6 Hours PRN     aspirin 81 MG tablet Take 81 mg by mouth daily.     cetirizine (ZYRTEC) 10 MG tablet TAKE 1 TABLET BY MOUTH EVERY DAY 30 tablet 5   Cholecalciferol (VITAMIN D3) 50 MCG (2000 UT) capsule Take 1 capsule (2,000 Units total) by mouth daily. 100 capsule 3   Cyanocobalamin (VITAMIN B-12) 1000 MCG SUBL Place 1 tablet (1,000 mcg total) under the tongue daily. 100 tablet 3   glucose blood (ACCU-CHEK GUIDE) test strip Use as instructed to check blood sugars 100 each 0   ipratropium (ATROVENT) 0.03 % nasal spray INSTILL 2 SPRAYS INTO EACH NOSTRIL TWICE A DAY 30 mL 5   loratadine (CLARITIN) 10 MG tablet Take 1 tablet (10 mg total) by mouth daily. 30 tablet 11   Magnesium 500 MG CAPS Take by mouth.     MegaRed Omega-3 Krill Oil 500 MG CAPS Take 1 capsule by mouth every morning. 100 capsule 3   Multiple Vitamins-Minerals (WOMENS MULTI GUMMIES PO) Take by mouth.     Omega-3 Fatty Acids (OMEGA 3 500 PO) Take by mouth.     Probiotic Product (PROBIOTIC ADVANCED PO) Take by mouth.     Quercetin 500 MG CAPS Take by mouth.     rosuvastatin (CRESTOR) 5 MG tablet Take 1 tablet (5 mg total) by mouth daily. 90 tablet 3   SYNTHROID 125 MCG tablet Take 1 tablet (125 mcg total) by mouth daily. 90 tablet 3   Zinc 50 MG TABS Take by mouth.     azithromycin (ZITHROMAX Z-PAK) 250 MG tablet As directed (Patient not taking: Reported on 01/21/2023) 6 tablet 0   No facility-administered medications prior to visit.     ROS: Review of Systems  Constitutional:  Positive for unexpected weight change. Negative for activity change, appetite change, chills and fatigue.  HENT:  Negative for congestion, mouth sores and sinus pressure.   Eyes:  Negative for visual disturbance.  Respiratory:  Negative for cough and chest tightness.   Gastrointestinal:  Negative for abdominal pain and nausea.  Genitourinary:  Negative for difficulty urinating, frequency and vaginal pain.  Musculoskeletal:  Negative for back pain and gait problem.  Skin:  Negative for pallor and rash.  Neurological:  Negative for dizziness, tremors, weakness, numbness and headaches.  Psychiatric/Behavioral:  Negative for confusion and sleep disturbance.     Objective:  BP 118/70 (BP Location: Left Arm, Patient Position: Sitting, Cuff Size: Large)   Pulse 70   Temp 97.9 F (36.6 C) (Oral)   Ht 5\' 3"  (1.6 m)   Wt 153 lb (69.4 kg)   SpO2 98%   BMI 27.10 kg/m   BP Readings from Last 3 Encounters:  01/21/23 118/70  12/11/22 130/76  03/19/22 122/80    Wt Readings from Last 3 Encounters:  01/21/23 153 lb (69.4 kg)  01/01/23 160 lb (72.6 kg)  12/11/22 161 lb (73 kg)    Physical Exam Constitutional:      General:  She is not in acute distress.    Appearance: She is well-developed. She is obese.  HENT:     Head: Normocephalic.     Right Ear: External ear normal.     Left Ear: External ear normal.     Nose: Nose normal.  Eyes:     General:        Right eye: No discharge.        Left eye: No discharge.     Conjunctiva/sclera: Conjunctivae normal.     Pupils: Pupils are equal, round, and reactive to light.  Neck:     Thyroid: No thyromegaly.     Vascular: No JVD.     Trachea: No tracheal deviation.  Cardiovascular:     Rate and Rhythm: Normal rate and regular rhythm.     Heart sounds: Normal heart sounds.  Pulmonary:     Effort: No respiratory distress.     Breath sounds: No stridor. No wheezing.  Abdominal:     General:  Bowel sounds are normal. There is no distension.     Palpations: Abdomen is soft. There is no mass.     Tenderness: There is no abdominal tenderness. There is no guarding or rebound.  Musculoskeletal:        General: No tenderness.     Cervical back: Normal range of motion and neck supple. No rigidity.  Lymphadenopathy:     Cervical: No cervical adenopathy.  Skin:    Findings: No erythema or rash.  Neurological:     Cranial Nerves: No cranial nerve deficit.     Motor: No abnormal muscle tone.     Coordination: Coordination normal.     Deep Tendon Reflexes: Reflexes normal.  Psychiatric:        Behavior: Behavior normal.        Thought Content: Thought content normal.        Judgment: Judgment normal.     A total time of 30 minutes was spent preparing to see the patient, reviewing tests, x-rays, operative reports and other medical records.  Also, obtaining history and performing comprehensive physical exam.  Additionally, counseling the patient regarding DM.   Finally, documenting clinical information in the health records, coordination of care, educating the patient.    Lab Results  Component Value Date   WBC 9.8 12/13/2022   HGB 12.1 12/13/2022   HCT 37.0 12/13/2022   PLT 381.0 12/13/2022   GLUCOSE 172 (H) 12/13/2022   CHOL 117 12/13/2022   TRIG 206.0 (H) 12/13/2022   HDL 36.80 (L) 12/13/2022   LDLDIRECT 56.0 12/13/2022   LDLCALC 120 (H) 07/22/2019   ALT 53 (H) 12/13/2022   AST 31 12/13/2022   NA 137 12/13/2022   K 4.3 12/13/2022   CL 102 12/13/2022   CREATININE 0.75 12/13/2022   BUN 15 12/13/2022   CO2 26 12/13/2022   TSH 0.71 12/13/2022   HGBA1C 7.8 (H) 12/13/2022    MM 3D SCREEN BREAST BILATERAL  Result Date: 02/19/2022 CLINICAL DATA:  Screening. EXAM: DIGITAL SCREENING BILATERAL MAMMOGRAM WITH TOMOSYNTHESIS AND CAD TECHNIQUE: Bilateral screening digital craniocaudal and mediolateral oblique mammograms were obtained. Bilateral screening digital breast  tomosynthesis was performed. The images were evaluated with computer-aided detection. COMPARISON:  Previous exam(s). ACR Breast Density Category b: There are scattered areas of fibroglandular density. FINDINGS: There are no findings suspicious for malignancy. IMPRESSION: No mammographic evidence of malignancy. A result letter of this screening mammogram will be mailed directly to the patient. RECOMMENDATION: Screening mammogram in one  year. (Code:SM-B-01Y) BI-RADS CATEGORY  1: Negative. Electronically Signed   By: Ted Mcalpine M.D.   On: 02/19/2022 16:15    Assessment & Plan:   Problem List Items Addressed This Visit     Hypothyroidism    On Levothroid      B12 deficiency - Primary    On Vit B12      Family history of early CAD    Re-start Krill oil On Crestor ASA 81 mg EC      Diabetes mellitus type 2 with complications (HCC)    New onset Pt declined meds On a better diet, lost 8 lbs RTC 2 mo w/labs Metformin or Ozonic options were discussed. Pt declined meds      Relevant Orders   Comprehensive metabolic panel   Hemoglobin A1c      No orders of the defined types were placed in this encounter.     Follow-up: No follow-ups on file.  Sonda Primes, MD

## 2023-01-21 NOTE — Assessment & Plan Note (Signed)
On Vit B12 

## 2023-01-22 MED ORDER — ACCU-CHEK GUIDE W/DEVICE KIT: PACK | 0 refills | Status: AC

## 2023-01-22 MED ORDER — ACCU-CHEK FASTCLIX LANCETS MISC: 0 refills | Status: AC

## 2023-01-22 MED ORDER — ACCU-CHEK GUIDE VI STRP: 1.0000 | ORAL_STRIP | Freq: Two times a day (BID) | 0 refills | Status: DC | PRN

## 2023-01-22 NOTE — Telephone Encounter (Signed)
MD states to send another BS monitor. Sent in Accu-chek guide w/ lancets & strips to [facility].Marland KitchenRaechel Chute

## 2023-01-22 NOTE — Telephone Encounter (Signed)
Patient returned Lucy's call. She would like a call back at [phone].

## 2023-01-30 ENCOUNTER — Telehealth: Payer: Self-pay | Admitting: Internal Medicine

## 2023-01-30 NOTE — Telephone Encounter (Signed)
Patient has questions about her blood pressure monitor and would like to discuss with Valentina Gu

## 2023-01-30 NOTE — Telephone Encounter (Signed)
Called pt back she states she received her BS monitor w/ supplies and its is working. just wanted to let us know that she did pick up at pharmacy.Marland KitchenRaechel Chute

## 2023-02-18 ENCOUNTER — Ambulatory Visit
Admission: RE | Admit: 2023-02-18 | Discharge: 2023-02-18 | Disposition: A | Discharge status: Home / Self Care | Payer: Medicare HMO | Source: Ambulatory Visit | Attending: Internal Medicine | Admitting: Internal Medicine

## 2023-02-18 DIAGNOSIS — Z1231 Encounter for screening mammogram for malignant neoplasm of breast: Secondary | ICD-10-CM

## 2023-02-20 ENCOUNTER — Encounter (INDEPENDENT_AMBULATORY_CARE_PROVIDER_SITE_OTHER): Payer: Self-pay

## 2023-03-07 ENCOUNTER — Encounter (INDEPENDENT_AMBULATORY_CARE_PROVIDER_SITE_OTHER): Payer: Self-pay

## 2023-03-08 ENCOUNTER — Other Ambulatory Visit: Payer: Self-pay | Admitting: Internal Medicine

## 2023-03-18 ENCOUNTER — Ambulatory Visit: Payer: Medicare HMO | Admitting: Internal Medicine

## 2023-03-19 ENCOUNTER — Other Ambulatory Visit (INDEPENDENT_AMBULATORY_CARE_PROVIDER_SITE_OTHER): Payer: Medicare HMO

## 2023-03-19 DIAGNOSIS — E118 Type 2 diabetes mellitus with unspecified complications: Secondary | ICD-10-CM

## 2023-03-19 LAB — COMPREHENSIVE METABOLIC PANEL
ALT: 20 U/L (ref 0–35)
AST: 17 U/L (ref 0–37)
Albumin: 4.1 g/dL (ref 3.5–5.2)
Alkaline Phosphatase: 99 U/L (ref 39–117)
BUN: 15 mg/dL (ref 6–23)
CO2: 28 mEq/L (ref 19–32)
Calcium: 9.5 mg/dL (ref 8.4–10.5)
Chloride: 102 mEq/L (ref 96–112)
Creatinine, Ser: 0.72 mg/dL (ref 0.40–1.20)
GFR: 78.84 mL/min (ref >60.00)
Glucose, Bld: 138 mg/dL — ABNORMAL HIGH (ref 70–99)
Potassium: 4.4 mEq/L (ref 3.5–5.1)
Sodium: 138 mEq/L (ref 135–145)
Total Bilirubin: 0.5 mg/dL (ref 0.2–1.2)
Total Protein: 7.4 g/dL (ref 6.0–8.3)

## 2023-03-19 LAB — HEMOGLOBIN A1C: Hgb A1c MFr Bld: 6.6 % — ABNORMAL HIGH (ref 4.6–6.5)

## 2023-03-26 ENCOUNTER — Ambulatory Visit: Payer: Medicare HMO | Admitting: Internal Medicine

## 2023-04-04 ENCOUNTER — Encounter: Payer: Medicare HMO | Attending: Internal Medicine | Admitting: Dietician

## 2023-04-04 ENCOUNTER — Encounter: Payer: Self-pay | Admitting: Dietician

## 2023-04-04 DIAGNOSIS — E119 Type 2 diabetes mellitus without complications: Secondary | ICD-10-CM | POA: Insufficient documentation

## 2023-04-04 DIAGNOSIS — Z713 Dietary counseling and surveillance: Secondary | ICD-10-CM | POA: Diagnosis not present

## 2023-04-04 NOTE — Patient Instructions (Signed)
Great job on changes made!

## 2023-04-04 NOTE — Progress Notes (Signed)
Appointment start:  1615 Appointment End:  1645  Patient attended Diabetes Core Classes 1-3 between 01/01/2023 and 01/15/2023 at [facility]. The purpose of the meeting today is to review information learned during those classes as well as review patient application and goals.   What are one or two positive things that you are doing right now to manage your diabetes?  Eating mindfully, staying active, checking blood sugar  What is the hardest part about your diabetes right now, causing you the most concern, or is the most worrisome to you about your diabetes?  Food cravings  What questions do you have today?  Is sourdough bread better  Have you participated in any diabetes support group?  no  History:  Type 2 Diabetes, hypothyroid, history of colon cancer A1C:  6.6% 03/19/2023 decreased from 7.8% on 12/13/2022 Medications/supplements include:  Omega 3, vitamin B-12, glucose support, probiotic, quercetin, magnesium, vitamin D3, vitamin K2, synthroid, rosuvastatin Sleep:  good Weight:   147 lbs 04/04/2023 Wt Readings from Last 3 Encounters:  01/21/23 153 lb (69.4 kg)  01/01/23 160 lb (72.6 kg)  12/11/22 161 lb (73 kg)   Blood Glucose:  daily  Fasting:  119 today  (115-130's)   Social History:  Her son lives with her.  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  Review of carbohydrate counting,  importance of regularly scheduled meals/snacks, and meal planning to improve quality of diet. Review of the effects of physical activity on glucose levels and long-term glucose control.  Recommended goal of 150 minutes of physical activity/week. Review of patient medications and discussed role of medication on blood glucose and possible side effects. Discussion of strategies to manage stress, psychosocial issues, and other obstacles to diabetes management. Review of short-term complications: hyper- and hypo-glycemia (causes, symptoms, and treatment options) Review of prevention, detection, and treatment of long-term complications.  Discussion of the role of prolonged elevated glucose levels on body systems.  Continuing Goals: Continue to eat mindfully. Continue to stay active Continue to monitor your blood glucose  Future Follow up:  6 months

## 2023-04-05 ENCOUNTER — Ambulatory Visit (INDEPENDENT_AMBULATORY_CARE_PROVIDER_SITE_OTHER): Payer: Medicare HMO | Admitting: *Deleted

## 2023-04-05 VITALS — BP 130/82 | HR 79 | Temp 97.9°F | Ht 63.0 in | Wt 144.0 lb

## 2023-04-05 DIAGNOSIS — Z0000 Encounter for general adult medical examination without abnormal findings: Secondary | ICD-10-CM | POA: Diagnosis not present

## 2023-04-05 NOTE — Patient Instructions (Addendum)
Ms. Schoenfelder , Thank you for taking time to come for your Medicare Wellness Visit. I appreciate your ongoing commitment to your health goals. Please review the following plan we discussed and let me know if I can assist you in the future.   These are the goals we discussed:  Goals       DIET - INCREASE WATER INTAKE      Lose 10 pounds.      Patient Stated (pt-stated)      To maintain my current health status by continuing to eat healthy, stay physically active and socially active.        This is a list of the screening recommended for you and due dates:  Health Maintenance  Topic Date Due   Complete foot exam   Never done   Eye exam for diabetics  Never done   Yearly kidney health urinalysis for diabetes  Never done   DEXA scan (bone density measurement)  06/05/2023*   Zoster (Shingles) Vaccine (1 of 2) 07/05/2023*   Hemoglobin A1C  09/19/2023   Yearly kidney function blood test for diabetes  03/18/2024   Medicare Annual Wellness Visit  04/04/2024   Pneumonia Vaccine  Completed   HPV Vaccine  Aged Out   DTaP/Tdap/Td vaccine  Discontinued   Flu Shot  Discontinued   COVID-19 Vaccine  Discontinued  *Topic was postponed. The date shown is not the original due date.        Health Maintenance, Female Adopting a healthy lifestyle and getting preventive care are important in promoting health and wellness. Ask your health care provider about: The right schedule for you to have regular tests and exams. Things you can do on your own to prevent diseases and keep yourself healthy. What should I know about diet, weight, and exercise? Eat a healthy diet  Eat a diet that includes plenty of vegetables, fruits, low-fat dairy products, and lean protein. Do not eat a lot of foods that are high in solid fats, added sugars, or sodium. Maintain a healthy weight Body mass index (BMI) is used to identify weight problems. It estimates body fat based on height and weight. Your health care  provider can help determine your BMI and help you achieve or maintain a healthy weight. Get regular exercise Get regular exercise. This is one of the most important things you can do for your health. Most adults should: Exercise for at least 150 minutes each week. The exercise should increase your heart rate and make you sweat (moderate-intensity exercise). Do strengthening exercises at least twice a week. This is in addition to the moderate-intensity exercise. Spend less time sitting. Even light physical activity can be beneficial. Watch cholesterol and blood lipids Have your blood tested for lipids and cholesterol at 80 years of age, then have this test every 5 years. Have your cholesterol levels checked more often if: Your lipid or cholesterol levels are high. You are older than 80 years of age. You are at high risk for heart disease. What should I know about cancer screening? Depending on your health history and family history, you may need to have cancer screening at various ages. This may include screening for: Breast cancer. Cervical cancer. Colorectal cancer. Skin cancer. Lung cancer. What should I know about heart disease, diabetes, and high blood pressure? Blood pressure and heart disease High blood pressure causes heart disease and increases the risk of stroke. This is more likely to develop in people who have high blood pressure readings or  are overweight. Have your blood pressure checked: Every 3-5 years if you are 9-81 years of age. Every year if you are 71 years old or older. Diabetes Have regular diabetes screenings. This checks your fasting blood sugar level. Have the screening done: Once every three years after age 32 if you are at a normal weight and have a low risk for diabetes. More often and at a younger age if you are overweight or have a high risk for diabetes. What should I know about preventing infection? Hepatitis B If you have a higher risk for hepatitis B,  you should be screened for this virus. Talk with your health care provider to find out if you are at risk for hepatitis B infection. Hepatitis C Testing is recommended for: Everyone born from [birth year] through 1963. Anyone with known risk factors for hepatitis C. Sexually transmitted infections (STIs) Get screened for STIs, including gonorrhea and chlamydia, if: You are sexually active and are younger than 80 years of age. You are older than 80 years of age and your health care provider tells you that you are at risk for this type of infection. Your sexual activity has changed since you were last screened, and you are at increased risk for chlamydia or gonorrhea. Ask your health care provider if you are at risk. Ask your health care provider about whether you are at high risk for HIV. Your health care provider may recommend a prescription medicine to help prevent HIV infection. If you choose to take medicine to prevent HIV, you should first get tested for HIV. You should then be tested every 3 months for as long as you are taking the medicine. Pregnancy If you are about to stop having your period (premenopausal) and you may become pregnant, seek counseling before you get pregnant. Take 400 to 800 micrograms (mcg) of folic acid every day if you become pregnant. Ask for birth control (contraception) if you want to prevent pregnancy. Osteoporosis and menopause Osteoporosis is a disease in which the bones lose minerals and strength with aging. This can result in bone fractures. If you are 19 years old or older, or if you are at risk for osteoporosis and fractures, ask your health care provider if you should: Be screened for bone loss. Take a calcium or vitamin D supplement to lower your risk of fractures. Be given hormone replacement therapy (HRT) to treat symptoms of menopause. Follow these instructions at home: Alcohol use Do not drink alcohol if: Your health care provider tells you not to  drink. You are pregnant, may be pregnant, or are planning to become pregnant. If you drink alcohol: Limit how much you have to: 0-1 drink a day. Know how much alcohol is in your drink. In the [place] one drink equals one 12 oz bottle of beer (355 mL), one 5 oz glass of wine (148 mL), or one 1 oz glass of hard liquor (44 mL). Lifestyle Do not use any products that contain nicotine or tobacco. These products include cigarettes, chewing tobacco, and vaping devices, such as e-cigarettes. If you need help quitting, ask your health care provider. Do not use street drugs. Do not share needles. Ask your health care provider for help if you need support or information about quitting drugs. General instructions Schedule regular health, dental, and eye exams. Stay current with your vaccines. Tell your health care provider if: You often feel depressed. You have ever been abused or do not feel safe at home. Summary Adopting a healthy lifestyle  and getting preventive care are important in promoting health and wellness. Follow your health care provider's instructions about healthy diet, exercising, and getting tested or screened for diseases. Follow your health care provider's instructions on monitoring your cholesterol and blood pressure. This information is not intended to replace advice given to you by your health care provider. Make sure you discuss any questions you have with your health care provider. Document Revised: 11/28/2020 Document Reviewed: 11/28/2020 Elsevier Patient Education  2024 [employer].

## 2023-04-05 NOTE — Progress Notes (Signed)
Subjective:   Whitney [name] is a 82 y.o. female who presents for Medicare Annual (Subsequent) preventive examination.  Visit Complete: In person  Patient Medicare AWV questionnaire was not completed by the patient   Cardiac Risk Factors include: advanced age (>24men, >28 women);diabetes mellitus    Please see problem list for additional risk factors  Objective:    Today's Vitals   04/05/23 1059  BP: 130/82  Pulse: 79  Temp: 97.9 F (36.6 C)  TempSrc: Oral  SpO2: 92%  Weight: 144 lb (65.3 kg)  Height: 5\' 3"  (1.6 m)   Body mass index is 25.51 kg/m.     04/05/2023   11:12 AM 01/01/2023    3:24 PM 03/19/2022    2:02 PM 03/15/2021    3:34 PM 03/26/2020   11:53 AM  Advanced Directives  Does Patient Have a Medical Advance Directive? Yes Yes Yes Yes Yes  Type of Estate agent of [place];Living will  Healthcare Power of [place];Living will Living will;Healthcare Power of Attorney   Does patient want to make changes to medical advance directive?  No - Patient declined No - Patient declined    Copy of Healthcare Power of Attorney in Chart?   No - copy requested No - copy requested     Current Medications (verified) Outpatient Encounter Medications as of 04/05/2023  Medication Sig   Accu-Chek FastClix Lancets MISC Use to check blood sugars twice a day as needed   Accu-Chek Softclix Lancets lancets USE AS INSTRUCTED TO CHECK BLOOD SUGAR   albuterol (VENTOLIN HFA) 108 (90 Base) MCG/ACT inhaler SMARTSIG:1 Puff(s) Via Inhaler Every 6 Hours PRN   aspirin 81 MG tablet Take 81 mg by mouth daily.   Blood Glucose Monitoring Suppl (ACCU-CHEK GUIDE) w/Device KIT Use to check blood sugars everyday   cetirizine (ZYRTEC) 10 MG tablet TAKE 1 TABLET BY MOUTH EVERY DAY   Cholecalciferol (VITAMIN D3) 50 MCG (2000 UT) capsule Take 1 capsule (2,000 Units total) by mouth daily.   Cyanocobalamin (VITAMIN B-12) 1000 MCG SUBL Place 1 tablet (1,000 mcg total) under the tongue  daily.   glucose blood (ACCU-CHEK GUIDE) test strip 1 each by Other route 2 (two) times daily as needed for other. Dx E11.8   ipratropium (ATROVENT) 0.03 % nasal spray INSTILL 2 SPRAYS INTO EACH NOSTRIL TWICE A DAY   loratadine (CLARITIN) 10 MG tablet Take 1 tablet (10 mg total) by mouth daily.   Magnesium 500 MG CAPS Take by mouth.   MegaRed Omega-3 Krill Oil 500 MG CAPS Take 1 capsule by mouth every morning.   Multiple Vitamins-Minerals (WOMENS MULTI GUMMIES PO) Take by mouth.   Omega-3 Fatty Acids (OMEGA 3 500 PO) Take by mouth.   Probiotic Product (PROBIOTIC ADVANCED PO) Take by mouth.   Quercetin 500 MG CAPS Take by mouth.   rosuvastatin (CRESTOR) 5 MG tablet Take 1 tablet (5 mg total) by mouth daily.   SYNTHROID 125 MCG tablet Take 1 tablet (125 mcg total) by mouth daily.   Zinc 50 MG TABS Take by mouth.   azithromycin (ZITHROMAX Z-PAK) 250 MG tablet As directed (Patient not taking: Reported on 01/21/2023)   No facility-administered encounter medications on file as of 04/05/2023.    Allergies (verified) Benzocaine-menthol, Codeine phosphate, and Penicillins   History: Past Medical History:  Diagnosis Date   Anemia    Iron def   B12 deficiency    Cancer (HCC)    colon ca   Depression    Diabetes  mellitus without complication (HCC)    History of colon cancer 2008   Hypothyroidism    Uterine polyp    Past Surgical History:  Procedure Laterality Date   BREAST BIOPSY Right 10/08/1995   Benign Excisional Biopsy   CHOLECYSTECTOMY     COLON SURGERY  2008   Resection   Family History  Problem Relation Age of Onset   Hypertension Mother    Heart disease Mother 39       CHF   Heart disease Father 59       MI died   Heart disease Other        CAD and CHF   Breast cancer Cousin    Social History   Socioeconomic History   Marital status: Widowed    Spouse name: Not on file   Number of children: Not on file   Years of education: Not on file   Highest education level:  Not on file  Occupational History   Not on file  Tobacco Use   Smoking status: Former   Smokeless tobacco: Never  Substance and Sexual Activity   Alcohol use: No   Drug use: No   Sexual activity: Yes  Other Topics Concern   Not on file  Social History Narrative   Not on file   Social Determinants of Health   Financial Resource Strain: Low Risk  (04/05/2023)   Overall Financial Resource Strain (CARDIA)    Difficulty of Paying Living Expenses: Not hard at all  Food Insecurity: No Food Insecurity (04/05/2023)   Hunger Vital Sign    Worried About Running Out of Food in the Last Year: Never true    Ran Out of Food in the Last Year: Never true  Transportation Needs: No Transportation Needs (04/05/2023)   PRAPARE - Administrator, Civil Service (Medical): No    Lack of Transportation (Non-Medical): No  Physical Activity: Sufficiently Active (04/05/2023)   Exercise Vital Sign    Days of Exercise per Week: 5 days    Minutes of Exercise per Session: 60 min  Stress: No Stress Concern Present (04/05/2023)   [employer] of Occupational Health - Occupational Stress Questionnaire    Feeling of Stress : Not at all  Social Connections: Moderately Integrated (04/05/2023)   Social Connection and Isolation Panel [NHANES]    Frequency of Communication with Friends and Family: More than three times a week    Frequency of Social Gatherings with Friends and Family: Three times a week    Attends Religious Services: More than 4 times per year    Active Member of Clubs or Organizations: Yes    Attends Banker Meetings: More than 4 times per year    Marital Status: Widowed    Tobacco Counseling Counseling given: Not Answered   Clinical Intake:  Pre-visit preparation completed: Yes  Pain : No/denies pain     Nutritional Risks: None Diabetes: Yes CBG done?: No Did pt. bring in CBG monitor from home?: No  How often do you need to have someone help you when you  read instructions, pamphlets, or other written materials from your doctor or pharmacy?: 1 - Never What is the last grade level you completed in school?: 2 year college  Interpreter Needed?: No      Activities of Daily Living    04/05/2023   11:04 AM  In your present state of health, do you have any difficulty performing the following activities:  Hearing? 0  Vision? 0  Difficulty concentrating or making decisions? 0  Walking or climbing stairs? 0  Dressing or bathing? 0  Doing errands, shopping? 0  Preparing Food and eating ? N  Using the Toilet? N  In the past six months, have you accidently leaked urine? N  Do you have problems with loss of bowel control? N  Managing your Medications? N  Managing your Finances? N  Housekeeping or managing your Housekeeping? N    Patient Care Team: Plotnikov, Georgina Quint, MD as PCP - General Candice Camp, MD as Attending Physician (Obstetrics and Gynecology) Vida Rigger, MD (Gastroenterology) Odogwu, Jamie Brookes, MD (Hematology and Oncology)  Indicate any recent Medical Services you may have received from other than [facility] providers in the past year (date may be approximate).     Assessment:   This is a routine wellness examination for Whitney [name].  Hearing/Vision screen Vision Screening - Comments:: Annual eye exam, Dr. Dione Booze   Goals Addressed   None   Depression Screen    04/05/2023   11:05 AM 01/21/2023   10:52 AM 01/01/2023    3:23 PM 12/11/2022    3:58 PM 03/19/2022    1:48 PM 03/15/2021    3:32 PM 03/01/2016   10:26 AM  PHQ 2/9 Scores  PHQ - 2 Score 0 0 0 0 0 0 0    Fall Risk    04/05/2023   11:05 AM 01/21/2023   10:51 AM 01/01/2023    3:23 PM 12/11/2022    3:58 PM 03/19/2022    2:03 PM  Fall Risk   Falls in the past year? 0 0 0 0 0  Number falls in past yr: 0 0  0 0  Injury with Fall? 0 0  0 0  Risk for fall due to : No Fall Risks No Fall Risks  No Fall Risks No Fall Risks  Follow up Falls evaluation completed Falls evaluation  completed  Falls evaluation completed Falls evaluation completed    MEDICARE RISK AT HOME: Medicare Risk at Home Any stairs in or around the home?: Yes If so, are there any without handrails?: No Home free of loose throw rugs in walkways, pet beds, electrical cords, etc?: Yes Adequate lighting in your home to reduce risk of falls?: Yes Life alert?: No Use of a cane, walker or w/c?: No Grab bars in the bathroom?: Yes Shower chair or bench in shower?: Yes Elevated toilet seat or a handicapped toilet?: Yes  TIMED UP AND GO:  Was the test performed?  Yes  Length of time to ambulate 10 feet: 6 sec Gait steady and fast without use of assistive device    Cognitive Function:        04/05/2023   11:20 AM 03/19/2022    1:50 PM  6CIT Screen  What Year? 0 points 0 points  What month? 0 points 0 points  What time? 0 points 0 points  Count back from 20 0 points 0 points  Months in reverse 0 points 0 points  Repeat phrase 0 points 0 points  Total Score 0 points 0 points    Immunizations Immunization History  Administered Date(s) Administered   Influenza Split 05/07/2008, 05/21/2008, 05/05/2009   Influenza Whole 05/07/2008, 05/21/2008, 05/05/2009   Pneumococcal Conjugate-13 12/17/2014   Pneumococcal Polysaccharide-23 05/21/2008, 03/01/2016   Tdap 07/09/2011   Zoster, Live 08/01/2014    TDAP status: Due, Education has been provided regarding the importance of this vaccine. Advised may receive this vaccine at local pharmacy or Health Dept. Aware  to provide a copy of the vaccination record if obtained from local pharmacy or Health Dept. Verbalized acceptance and understanding.  Flu Vaccine status: Declined, Education has been provided regarding the importance of this vaccine but patient still declined. Advised may receive this vaccine at local pharmacy or Health Dept. Aware to provide a copy of the vaccination record if obtained from local pharmacy or Health Dept. Verbalized acceptance  and understanding.  Pneumococcal vaccine status: Up to date  Covid-19 vaccine status: Completed vaccines  Qualifies for Shingles Vaccine? Yes   Zostavax completed No   Shingrix Completed?: No.    Education has been provided regarding the importance of this vaccine. Patient has been advised to call insurance company to determine out of pocket expense if they have not yet received this vaccine. Advised may also receive vaccine at local pharmacy or Health Dept. Verbalized acceptance and understanding.  Screening Tests Health Maintenance  Topic Date Due   FOOT EXAM  Never done   OPHTHALMOLOGY EXAM  Never done   Diabetic kidney evaluation - Urine ACR  Never done   DEXA SCAN  06/05/2023 (Originally 07/26/2007)   Zoster Vaccines- Shingrix (1 of 2) 07/05/2023 (Originally 07/25/1959)   HEMOGLOBIN A1C  09/19/2023   Diabetic kidney evaluation - eGFR measurement  03/18/2024   Medicare Annual Wellness (AWV)  04/04/2024   Pneumonia Vaccine 77+ Years old  Completed   HPV VACCINES  Aged Out   DTaP/Tdap/Td  Discontinued   INFLUENZA VACCINE  Discontinued   COVID-19 Vaccine  Discontinued    Health Maintenance  Health Maintenance Due  Topic Date Due   FOOT EXAM  Never done   OPHTHALMOLOGY EXAM  Never done   Diabetic kidney evaluation - Urine ACR  Never done    Colorectal cancer screening: No longer required.   Mammogram status: Completed 02/18/2023. Repeat every year  Bone Density screening: patient wants to discuss this with the provider at her next office visit  Lung Cancer Screening: (Low Dose CT Chest recommended if Age 19-80 years, 20 pack-year currently smoking OR have quit w/in 15years.) does not qualify.   Lung Cancer Screening Referral: n/a  Additional Screening:  Hepatitis C Screening: does qualify; Completed no  Vision Screening: Recommended annual ophthalmology exams for early detection of glaucoma and other disorders of the eye. Is the patient up to date with their annual eye  exam?  Yes  Who is the provider or what is the name of the office in which the patient attends annual eye exams? Dr. Dione Booze If pt is not established with a provider, would they like to be referred to a provider to establish care? No .   Dental Screening: Recommended annual dental exams for proper oral hygiene  Diabetic Foot Exam: Diabetic Foot Exam: Overdue, Pt has been advised about the importance in completing this exam. Pt is scheduled for diabetic foot exam on 04/05/2023.  Community Resource Referral / Chronic Care Management: CRR required this visit?  No   CCM required this visit?  No     Plan:     I have personally reviewed and noted the following in the patient's chart:   Medical and social history Use of alcohol, tobacco or illicit drugs  Current medications and supplements including opioid prescriptions. Patient is not currently taking opioid prescriptions. Functional ability and status Nutritional status Physical activity Advanced directives List of other physicians Hospitalizations, surgeries, and ER visits in previous 12 months Vitals Screenings to include cognitive, depression, and falls Referrals and appointments  In addition, I have reviewed and discussed with patient certain preventive protocols, quality metrics, and best practice recommendations. A written personalized care plan for preventive services as well as general preventive health recommendations were provided to patient.     Tamela Oddi, CMA   04/05/2023   After Visit Summary: sent via mychart to patient   Nurse Notes:  Ms. Recio , Thank you for taking time to come for your Medicare Wellness Visit. I appreciate your ongoing commitment to your health goals. Please review the following plan we discussed and let me know if I can assist you in the future.   These are the goals we discussed:  Goals       DIET - INCREASE WATER INTAKE      Lose 10 pounds.      Patient Stated (pt-stated)       To maintain my current health status by continuing to eat healthy, stay physically active and socially active.        This is a list of the screening recommended for you and due dates:  Health Maintenance  Topic Date Due   Complete foot exam   Never done   Eye exam for diabetics  Never done   Yearly kidney health urinalysis for diabetes  Never done   DEXA scan (bone density measurement)  06/05/2023*   Zoster (Shingles) Vaccine (1 of 2) 07/05/2023*   Hemoglobin A1C  09/19/2023   Yearly kidney function blood test for diabetes  03/18/2024   Medicare Annual Wellness Visit  04/04/2024   Pneumonia Vaccine  Completed   HPV Vaccine  Aged Out   DTaP/Tdap/Td vaccine  Discontinued   Flu Shot  Discontinued   COVID-19 Vaccine  Discontinued  *Topic was postponed. The date shown is not the original due date.

## 2023-04-08 ENCOUNTER — Telehealth: Payer: Self-pay | Admitting: Internal Medicine

## 2023-04-08 ENCOUNTER — Other Ambulatory Visit: Payer: Self-pay

## 2023-04-08 MED ORDER — ACCU-CHEK SOFTCLIX LANCETS MISC: 3 refills | Status: DC

## 2023-04-08 NOTE — Telephone Encounter (Signed)
Patient called and said the Accu-check softclix pen for checking her blood sugar has broken and she is not able to use it to check her sugar. She would like to know if a prescription can be sent to [facility] to replace it. Best callback is [phone].

## 2023-04-08 NOTE — Telephone Encounter (Signed)
I was able to send in requested device to pts preferred pharmacy.

## 2023-04-10 ENCOUNTER — Ambulatory Visit (INDEPENDENT_AMBULATORY_CARE_PROVIDER_SITE_OTHER): Payer: Medicare HMO | Admitting: Internal Medicine

## 2023-04-10 ENCOUNTER — Encounter: Payer: Self-pay | Admitting: Internal Medicine

## 2023-04-10 VITALS — BP 120/70 | HR 67 | Temp 98.3°F | Ht 63.0 in | Wt 142.0 lb

## 2023-04-10 DIAGNOSIS — I2510 Atherosclerotic heart disease of native coronary artery without angina pectoris: Secondary | ICD-10-CM | POA: Diagnosis not present

## 2023-04-10 DIAGNOSIS — E538 Deficiency of other specified B group vitamins: Secondary | ICD-10-CM

## 2023-04-10 DIAGNOSIS — E034 Atrophy of thyroid (acquired): Secondary | ICD-10-CM | POA: Diagnosis not present

## 2023-04-10 DIAGNOSIS — Z780 Asymptomatic menopausal state: Secondary | ICD-10-CM | POA: Diagnosis not present

## 2023-04-10 DIAGNOSIS — I2583 Coronary atherosclerosis due to lipid rich plaque: Secondary | ICD-10-CM

## 2023-04-10 DIAGNOSIS — E118 Type 2 diabetes mellitus with unspecified complications: Secondary | ICD-10-CM

## 2023-04-10 NOTE — Assessment & Plan Note (Addendum)
On a better diet, lost 8 lbs On Glucose support supplement Pt stopped Metformin On ASA, Krill oil, Crestor

## 2023-04-10 NOTE — Progress Notes (Signed)
Subjective:  Patient ID: Whitney [name], female    DOB: [birth year]  Age: 82 y.o. MRN: [id number]  CC: Follow-up (2 MNTH F/U)   HPI MIYEKO CAUFIELD presents for DM, CAD, dyslipidemia  Outpatient Medications Prior to Visit  Medication Sig Dispense Refill   Accu-Chek FastClix Lancets MISC Use to check blood sugars twice a day as needed 102 each 0   Accu-Chek Softclix Lancets lancets USE AS INSTRUCTED TO CHECK BLOOD SUGAR 100 each 3   albuterol (VENTOLIN HFA) 108 (90 Base) MCG/ACT inhaler SMARTSIG:1 Puff(s) Via Inhaler Every 6 Hours PRN     aspirin 81 MG tablet Take 81 mg by mouth daily.     Blood Glucose Monitoring Suppl (ACCU-CHEK GUIDE) w/Device KIT Use to check blood sugars everyday 1 kit 0   cetirizine (ZYRTEC) 10 MG tablet TAKE 1 TABLET BY MOUTH EVERY DAY 30 tablet 5   Cholecalciferol (VITAMIN D3) 50 MCG (2000 UT) capsule Take 1 capsule (2,000 Units total) by mouth daily. 100 capsule 3   Cyanocobalamin (VITAMIN B-12) 1000 MCG SUBL Place 1 tablet (1,000 mcg total) under the tongue daily. 100 tablet 3   glucose blood (ACCU-CHEK GUIDE) test strip 1 each by Other route 2 (two) times daily as needed for other. Dx E11.8 100 each 0   ipratropium (ATROVENT) 0.03 % nasal spray INSTILL 2 SPRAYS INTO EACH NOSTRIL TWICE A DAY 30 mL 5   loratadine (CLARITIN) 10 MG tablet Take 1 tablet (10 mg total) by mouth daily. 30 tablet 11   Magnesium 500 MG CAPS Take by mouth.     MegaRed Omega-3 Krill Oil 500 MG CAPS Take 1 capsule by mouth every morning. 100 capsule 3   Multiple Vitamins-Minerals (WOMENS MULTI GUMMIES PO) Take by mouth.     Omega-3 Fatty Acids (OMEGA 3 500 PO) Take by mouth.     Probiotic Product (PROBIOTIC ADVANCED PO) Take by mouth.     Quercetin 500 MG CAPS Take by mouth.     rosuvastatin (CRESTOR) 5 MG tablet Take 1 tablet (5 mg total) by mouth daily. 90 tablet 3   SYNTHROID 125 MCG tablet Take 1 tablet (125 mcg total) by mouth daily. 90 tablet 3   Zinc 50 MG TABS Take by  mouth.     azithromycin (ZITHROMAX Z-PAK) 250 MG tablet As directed (Patient not taking: Reported on 01/21/2023) 6 tablet 0   No facility-administered medications prior to visit.    ROS: Review of Systems  Constitutional:  Negative for activity change, appetite change, chills, fatigue and unexpected weight change.  HENT:  Negative for congestion, mouth sores and sinus pressure.   Eyes:  Negative for visual disturbance.  Respiratory:  Negative for cough and chest tightness.   Gastrointestinal:  Negative for abdominal pain and nausea.  Genitourinary:  Negative for difficulty urinating, frequency and vaginal pain.  Musculoskeletal:  Negative for back pain and gait problem.  Skin:  Negative for pallor and rash.  Neurological:  Negative for dizziness, tremors, weakness, numbness and headaches.  Psychiatric/Behavioral:  Negative for confusion and sleep disturbance.     Objective:  BP 120/70 (BP Location: Right Arm, Patient Position: Sitting, Cuff Size: Normal)   Pulse 67   Temp 98.3 F (36.8 C) (Oral)   Ht 5\' 3"  (1.6 m)   Wt 142 lb (64.4 kg)   SpO2 97%   BMI 25.15 kg/m   BP Readings from Last 3 Encounters:  04/10/23 120/70  04/05/23 130/82  01/21/23 118/70    Wt  Readings from Last 3 Encounters:  04/10/23 142 lb (64.4 kg)  04/05/23 144 lb (65.3 kg)  01/21/23 153 lb (69.4 kg)    Physical Exam Constitutional:      General: She is not in acute distress.    Appearance: She is well-developed.  HENT:     Head: Normocephalic.     Right Ear: External ear normal.     Left Ear: External ear normal.     Nose: Nose normal.  Eyes:     General:        Right eye: No discharge.        Left eye: No discharge.     Conjunctiva/sclera: Conjunctivae normal.     Pupils: Pupils are equal, round, and reactive to light.  Neck:     Thyroid: No thyromegaly.     Vascular: No JVD.     Trachea: No tracheal deviation.  Cardiovascular:     Rate and Rhythm: Normal rate and regular rhythm.      Heart sounds: Normal heart sounds.  Pulmonary:     Effort: No respiratory distress.     Breath sounds: No stridor. No wheezing.  Abdominal:     General: Bowel sounds are normal. There is no distension.     Palpations: Abdomen is soft. There is no mass.     Tenderness: There is no abdominal tenderness. There is no guarding or rebound.  Musculoskeletal:        General: No tenderness.     Cervical back: Normal range of motion and neck supple. No rigidity.  Lymphadenopathy:     Cervical: No cervical adenopathy.  Skin:    Findings: No erythema or rash.  Neurological:     Cranial Nerves: No cranial nerve deficit.     Sensory: No sensory deficit.     Motor: No abnormal muscle tone.     Coordination: Coordination normal.     Deep Tendon Reflexes: Reflexes normal.  Psychiatric:        Behavior: Behavior normal.        Thought Content: Thought content normal.        Judgment: Judgment normal.     Lab Results  Component Value Date   WBC 9.8 12/13/2022   HGB 12.1 12/13/2022   HCT 37.0 12/13/2022   PLT 381.0 12/13/2022   GLUCOSE 138 (H) 03/19/2023   CHOL 117 12/13/2022   TRIG 206.0 (H) 12/13/2022   HDL 36.80 (L) 12/13/2022   LDLDIRECT 56.0 12/13/2022   LDLCALC 120 (H) 07/22/2019   ALT 20 03/19/2023   AST 17 03/19/2023   NA 138 03/19/2023   K 4.4 03/19/2023   CL 102 03/19/2023   CREATININE 0.72 03/19/2023   BUN 15 03/19/2023   CO2 28 03/19/2023   TSH 0.71 12/13/2022   HGBA1C 6.6 (H) 03/19/2023    MM 3D SCREENING MAMMOGRAM BILATERAL BREAST  Result Date: 02/20/2023 CLINICAL DATA:  Screening. EXAM: DIGITAL SCREENING BILATERAL MAMMOGRAM WITH TOMOSYNTHESIS AND CAD TECHNIQUE: Bilateral screening digital craniocaudal and mediolateral oblique mammograms were obtained. Bilateral screening digital breast tomosynthesis was performed. The images were evaluated with computer-aided detection. COMPARISON:  Previous exam(s). ACR Breast Density Category b: There are scattered areas of  fibroglandular density. FINDINGS: There are no findings suspicious for malignancy. IMPRESSION: No mammographic evidence of malignancy. A result letter of this screening mammogram will be mailed directly to the patient. RECOMMENDATION: Screening mammogram in one year. (Code:SM-B-01Y) BI-RADS CATEGORY  1: Negative. Electronically Signed   By: Coralyn Mark.D.  On: 02/20/2023 08:09    Assessment & Plan:   Problem List Items Addressed This Visit     Hypothyroidism - Primary    Chronic On Levothroid      B12 deficiency    On Vit B12      Relevant Orders   Comprehensive metabolic panel   Hemoglobin A1c   TSH   Coronary atherosclerosis    On ASA, Krill oil, Crestor      Diabetes mellitus type 2 with complications (HCC)    On a better diet, lost 8 lbs On Glucose support supplement Pt stopped Metformin On ASA, Krill oil, Crestor      Relevant Orders   Comprehensive metabolic panel   Hemoglobin A1c   TSH      No orders of the defined types were placed in this encounter.     Follow-up: Return in about 4 months (around 08/10/2023) for a follow-up visit.  Sonda Primes, MD

## 2023-04-10 NOTE — Assessment & Plan Note (Signed)
Chronic On Levothroid -

## 2023-04-10 NOTE — Assessment & Plan Note (Addendum)
On ASA, Krill oil, Crestor

## 2023-04-10 NOTE — Assessment & Plan Note (Signed)
On Vit B12

## 2023-04-16 DIAGNOSIS — E119 Type 2 diabetes mellitus without complications: Secondary | ICD-10-CM | POA: Diagnosis not present

## 2023-04-16 DIAGNOSIS — H2512 Age-related nuclear cataract, left eye: Secondary | ICD-10-CM | POA: Diagnosis not present

## 2023-04-16 LAB — HM DIABETES EYE EXAM

## 2023-05-02 ENCOUNTER — Ambulatory Visit
Admission: RE | Admit: 2023-05-02 | Discharge: 2023-05-02 | Disposition: A | Discharge status: Home / Self Care | Payer: Medicare HMO | Source: Ambulatory Visit | Attending: Internal Medicine | Admitting: Internal Medicine

## 2023-05-02 DIAGNOSIS — Z780 Asymptomatic menopausal state: Secondary | ICD-10-CM

## 2023-06-14 DIAGNOSIS — H25812 Combined forms of age-related cataract, left eye: Secondary | ICD-10-CM | POA: Diagnosis not present

## 2023-06-15 ENCOUNTER — Other Ambulatory Visit: Payer: Self-pay | Admitting: Internal Medicine

## 2023-07-04 DIAGNOSIS — H2511 Age-related nuclear cataract, right eye: Secondary | ICD-10-CM | POA: Diagnosis not present

## 2023-07-09 DIAGNOSIS — H25811 Combined forms of age-related cataract, right eye: Secondary | ICD-10-CM | POA: Diagnosis not present

## 2023-07-09 DIAGNOSIS — H52221 Regular astigmatism, right eye: Secondary | ICD-10-CM | POA: Diagnosis not present

## 2023-08-08 ENCOUNTER — Telehealth: Payer: Self-pay

## 2023-08-08 ENCOUNTER — Ambulatory Visit: Payer: Self-pay | Admitting: Internal Medicine

## 2023-08-08 DIAGNOSIS — U071 COVID-19: Secondary | ICD-10-CM | POA: Diagnosis not present

## 2023-08-08 DIAGNOSIS — J101 Influenza due to other identified influenza virus with other respiratory manifestations: Secondary | ICD-10-CM | POA: Diagnosis not present

## 2023-08-08 NOTE — Telephone Encounter (Signed)
Copied from CRM [id number]. Topic: Clinical - Medical Advice >> Aug 08, 2023  8:24 AM Mosetta Putt H wrote:  Reason for CRM: Pt is experiencing dehydration with the new medication she is taking

## 2023-08-08 NOTE — Telephone Encounter (Signed)
  Chief Complaint: URI Symptoms: cough, HA, sore throat, joints ache Frequency: yesterday Pertinent Negatives: Patient denies SOB, denies CP  Disposition: [] ED /[] Urgent Care (no appt availability in office) / [] Appointment(In office/virtual)/ []  [facility]/ [x] Home Care/ [] Refused Recommended Disposition /[]  Mobile Bus/ []  Follow-up with PCP  Additional Notes: Pt not sure if she has a fever as she does not have a thermometer. Pt refusing to take at home covid test that is in her possession, pt states that she was not exposed to covid. Pt taking tylenol and experiencing relief.  Pt advised of home care treatment per Epic, pt states that she would like to go to a walk-in clinic. Pt denies getting vaccines.   Copied from CRM [id number]. Topic: Clinical - Red Word Triage >> Aug 08, 2023  9:33 AM Marica Otter wrote: Red Word that prompted transfer to Nurse Triage: Congestion, headache, has been really thirsty. Pain in joints. Reason for Disposition  [1] Headache AND [2] part of viral illness AND [3] present < 72 hours  Answer Assessment - Initial Assessment Questions 1. LOCATION: "Where does it hurt?"      temples 2. ONSET: "When did the headache start?" (Minutes, hours or days)      yesterday 3. PATTERN: "Does the pain come and go, or has it been constant since it started?"     constant 4. SEVERITY: "How bad is the pain?" and "What does it keep you from doing?"  (e.g., Scale 1-10; mild, moderate, or severe)   - MILD (1-3): doesn't interfere with normal activities    - MODERATE (4-7): interferes with normal activities or awakens from sleep    - SEVERE (8-10): excruciating pain, unable to do any normal activities        moderate 5. RECURRENT SYMPTOM: "Have you ever had headaches before?" If Yes, ask: "When was the last time?" and "What happened that time?"      denies 6. CAUSE: "What do you think is causing the headache?"     I have no idea 7. MIGRAINE: "Have you been  diagnosed with migraine headaches?" If Yes, ask: "Is this headache similar?"      never 8. HEAD INJURY: "Has there been any recent injury to the head?"      denies 9. OTHER SYMPTOMS: "Do you have any other symptoms?" (fever, stiff neck, eye pain, sore throat, cold symptoms)     Nasal congestion, coughing,  Protocols used: Headache-A-AH

## 2023-08-08 NOTE — Telephone Encounter (Signed)
What is the new medication?  Thanks

## 2023-08-09 NOTE — Telephone Encounter (Signed)
Spoke with patient, she went to an urgent care yesterday cannot remember the name. I am unable to find it in her chart at the moment, patient states they tested her for Flu and Covid and they both came back positive. She is unsure of the medication name, but she is taking tylenol for pain and using sugar free cough drops. She states she discovered it is not the medications dehydrating her, she was just not consuming enough fluids. Has increased fluids, does not feel as dehydrated.

## 2023-08-09 NOTE — Telephone Encounter (Signed)
LVM for patient to return call, to find out which medication she is referring to

## 2023-08-12 ENCOUNTER — Ambulatory Visit: Payer: Medicare HMO | Admitting: Internal Medicine

## 2023-08-13 ENCOUNTER — Telehealth: Payer: Self-pay | Admitting: Internal Medicine

## 2023-08-13 ENCOUNTER — Ambulatory Visit: Payer: Medicare HMO | Admitting: Internal Medicine

## 2023-08-13 NOTE — Telephone Encounter (Signed)
LVM for patient, let her know to call back if she is needed anything further.

## 2023-08-13 NOTE — Telephone Encounter (Unsigned)
Copied from CRM [id number]. Topic: General - Other >> Aug 13, 2023 11:12 AM Fuller Mandril wrote: Reason for CRM: Patient returning call to [facility]. Called [facility]. No Answer. Patient states she is coming out of the flu and starting to feel a little better. Is staying hydrated.

## 2023-08-13 NOTE — Telephone Encounter (Signed)
Hope she is getting better.  Please schedule an office visit if problems.  Thank you

## 2023-08-26 ENCOUNTER — Encounter: Payer: Self-pay | Admitting: Internal Medicine

## 2023-08-26 ENCOUNTER — Ambulatory Visit (INDEPENDENT_AMBULATORY_CARE_PROVIDER_SITE_OTHER): Payer: Medicare HMO | Admitting: Internal Medicine

## 2023-08-26 VITALS — BP 120/70 | HR 70 | Temp 98.6°F | Ht 63.0 in | Wt 141.0 lb

## 2023-08-26 DIAGNOSIS — Z8249 Family history of ischemic heart disease and other diseases of the circulatory system: Secondary | ICD-10-CM

## 2023-08-26 DIAGNOSIS — D509 Iron deficiency anemia, unspecified: Secondary | ICD-10-CM | POA: Diagnosis not present

## 2023-08-26 DIAGNOSIS — Z8616 Personal history of COVID-19: Secondary | ICD-10-CM | POA: Diagnosis not present

## 2023-08-26 DIAGNOSIS — E034 Atrophy of thyroid (acquired): Secondary | ICD-10-CM

## 2023-08-26 DIAGNOSIS — E538 Deficiency of other specified B group vitamins: Secondary | ICD-10-CM | POA: Diagnosis not present

## 2023-08-26 DIAGNOSIS — E118 Type 2 diabetes mellitus with unspecified complications: Secondary | ICD-10-CM

## 2023-08-26 DIAGNOSIS — I2583 Coronary atherosclerosis due to lipid rich plaque: Secondary | ICD-10-CM | POA: Diagnosis not present

## 2023-08-26 LAB — COMPREHENSIVE METABOLIC PANEL
ALT: 22 U/L (ref 0–35)
AST: 21 U/L (ref 0–37)
Albumin: 4.2 g/dL (ref 3.5–5.2)
Alkaline Phosphatase: 100 U/L (ref 39–117)
BUN: 16 mg/dL (ref 6–23)
CO2: 30 meq/L (ref 19–32)
Calcium: 9.4 mg/dL (ref 8.4–10.5)
Chloride: 103 meq/L (ref 96–112)
Creatinine, Ser: 0.78 mg/dL (ref 0.40–1.20)
GFR: 71.4 mL/min (ref >60.00)
Glucose, Bld: 99 mg/dL (ref 70–99)
Potassium: 4.1 meq/L (ref 3.5–5.1)
Sodium: 140 meq/L (ref 135–145)
Total Bilirubin: 0.3 mg/dL (ref 0.2–1.2)
Total Protein: 7.4 g/dL (ref 6.0–8.3)

## 2023-08-26 LAB — TSH: TSH: 0.03 u[IU]/mL — ABNORMAL LOW (ref 0.35–5.50)

## 2023-08-26 LAB — HEMOGLOBIN A1C: Hgb A1c MFr Bld: 6.6 % — ABNORMAL HIGH (ref 4.6–6.5)

## 2023-08-26 NOTE — Assessment & Plan Note (Signed)
 Chronic On Levothroid -

## 2023-08-26 NOTE — Assessment & Plan Note (Signed)
On Krill oil 

## 2023-08-26 NOTE — Assessment & Plan Note (Signed)
No CP CT calcium score is 296 On ASA, Krill oil, Crestor

## 2023-08-26 NOTE — Progress Notes (Signed)
Subjective:  Patient ID: Whitney [name], female    DOB: [birth year]  Age: 83 y.o. MRN: [id number]  CC: Medical Management of Chronic Issues (4 mnth f/u)   HPI Whitney [name] presents for recent COVID 2 wks ago F/u hypothyroidism, DM2  Outpatient Medications Prior to Visit  Medication Sig Dispense Refill   Accu-Chek FastClix Lancets MISC Use to check blood sugars twice a day as needed 102 each 0   Accu-Chek Softclix Lancets lancets USE AS INSTRUCTED TO CHECK BLOOD SUGAR 100 each 3   albuterol (VENTOLIN HFA) 108 (90 Base) MCG/ACT inhaler SMARTSIG:1 Puff(s) Via Inhaler Every 6 Hours PRN     aspirin 81 MG tablet Take 81 mg by mouth daily.     Blood Glucose Monitoring Suppl (ACCU-CHEK GUIDE) w/Device KIT Use to check blood sugars everyday 1 kit 0   cetirizine (ZYRTEC) 10 MG tablet TAKE 1 TABLET BY MOUTH EVERY DAY 30 tablet 5   Cholecalciferol (VITAMIN D3) 50 MCG (2000 UT) capsule Take 1 capsule (2,000 Units total) by mouth daily. 100 capsule 3   Cyanocobalamin (VITAMIN B-12) 1000 MCG SUBL Place 1 tablet (1,000 mcg total) under the tongue daily. 100 tablet 3   glucose blood (ACCU-CHEK GUIDE TEST) test strip USE  STRIP TO CHECK GLUCOSE TWICE DAILY AS NEEDED 100 each 3   ipratropium (ATROVENT) 0.03 % nasal spray INSTILL 2 SPRAYS INTO EACH NOSTRIL TWICE A DAY 30 mL 5   loratadine (CLARITIN) 10 MG tablet Take 1 tablet (10 mg total) by mouth daily. 30 tablet 11   MegaRed Omega-3 Krill Oil 500 MG CAPS Take 1 capsule by mouth every morning. 100 capsule 3   Multiple Vitamins-Minerals (WOMENS MULTI GUMMIES PO) Take by mouth.     Probiotic Product (PROBIOTIC ADVANCED PO) Take by mouth.     rosuvastatin (CRESTOR) 5 MG tablet Take 1 tablet (5 mg total) by mouth daily. 90 tablet 3   SYNTHROID 125 MCG tablet Take 1 tablet (125 mcg total) by mouth daily. 90 tablet 3   Magnesium 500 MG CAPS Take by mouth.     Omega-3 Fatty Acids (OMEGA 3 500 PO) Take by mouth.     Quercetin 500 MG CAPS Take by  mouth.     Zinc 50 MG TABS Take by mouth.     azithromycin (ZITHROMAX Z-PAK) 250 MG tablet As directed (Patient not taking: Reported on 08/26/2023) 6 tablet 0   No facility-administered medications prior to visit.    ROS: Review of Systems  Constitutional:  Negative for activity change, appetite change, chills, fatigue and unexpected weight change.  HENT:  Negative for congestion, mouth sores and sinus pressure.   Eyes:  Negative for visual disturbance.  Respiratory:  Negative for cough and chest tightness.   Gastrointestinal:  Negative for abdominal pain and nausea.  Genitourinary:  Negative for difficulty urinating, frequency and vaginal pain.  Musculoskeletal:  Negative for back pain and gait problem.  Skin:  Negative for pallor and rash.  Neurological:  Negative for dizziness, tremors, weakness, numbness and headaches.  Psychiatric/Behavioral:  Negative for confusion and sleep disturbance.     Objective:  BP 120/70 (BP Location: Left Arm, Patient Position: Sitting, Cuff Size: Normal)   Pulse 70   Temp 98.6 F (37 C) (Oral)   Ht 5\' 3"  (1.6 m)   Wt 141 lb (64 kg)   SpO2 92%   BMI 24.98 kg/m   BP Readings from Last 3 Encounters:  08/26/23 120/70  04/10/23 120/70  04/05/23  130/82    Wt Readings from Last 3 Encounters:  08/26/23 141 lb (64 kg)  04/10/23 142 lb (64.4 kg)  04/05/23 144 lb (65.3 kg)    Physical Exam Constitutional:      General: She is not in acute distress.    Appearance: She is well-developed.  HENT:     Head: Normocephalic.     Right Ear: External ear normal.     Left Ear: External ear normal.     Nose: Nose normal.  Eyes:     General:        Right eye: No discharge.        Left eye: No discharge.     Conjunctiva/sclera: Conjunctivae normal.     Pupils: Pupils are equal, round, and reactive to light.  Neck:     Thyroid: No thyromegaly.     Vascular: No JVD.     Trachea: No tracheal deviation.  Cardiovascular:     Rate and Rhythm: Normal  rate and regular rhythm.     Heart sounds: Normal heart sounds.  Pulmonary:     Effort: No respiratory distress.     Breath sounds: No stridor. No wheezing.  Abdominal:     General: Bowel sounds are normal. There is no distension.     Palpations: Abdomen is soft. There is no mass.     Tenderness: There is no abdominal tenderness. There is no guarding or rebound.  Musculoskeletal:        General: No tenderness.     Cervical back: Normal range of motion and neck supple. No rigidity.     Right lower leg: No edema.     Left lower leg: No edema.  Lymphadenopathy:     Cervical: No cervical adenopathy.  Skin:    Findings: No erythema or rash.  Neurological:     Cranial Nerves: No cranial nerve deficit.     Motor: No abnormal muscle tone.     Coordination: Coordination normal.     Deep Tendon Reflexes: Reflexes normal.  Psychiatric:        Behavior: Behavior normal.        Thought Content: Thought content normal.        Judgment: Judgment normal.     Lab Results  Component Value Date   WBC 9.8 12/13/2022   HGB 12.1 12/13/2022   HCT 37.0 12/13/2022   PLT 381.0 12/13/2022   GLUCOSE 138 (H) 03/19/2023   CHOL 117 12/13/2022   TRIG 206.0 (H) 12/13/2022   HDL 36.80 (L) 12/13/2022   LDLDIRECT 56.0 12/13/2022   LDLCALC 120 (H) 07/22/2019   ALT 20 03/19/2023   AST 17 03/19/2023   NA 138 03/19/2023   K 4.4 03/19/2023   CL 102 03/19/2023   CREATININE 0.72 03/19/2023   BUN 15 03/19/2023   CO2 28 03/19/2023   TSH 0.71 12/13/2022   HGBA1C 6.6 (H) 03/19/2023    DG Bone Density Result Date: 05/03/2023 Table formatting from the original result was not included. Date of study: 05/02/2023 Exam: DUAL X-RAY ABSORPTIOMETRY (DXA) FOR BONE MINERAL DENSITY (BMD) Instrument: [employer] Requesting Provider: PCP Indication: Screening for osteoporosis Comparison: none (please note that it is not possible to compare data from different instruments) Clinical data: Pt is a 83 y.o. female  without previous history of fracture. On calcium and vitamin D. Results:  Lumbar spine L1-L4 (L2) Femoral neck (FN) 33% distal radius T-score -0.2 RFN: -2.1 LFN: -2.0 n/a Assessment: the BMD is low according to  the WHO classification for osteoporosis (see below). Fracture risk: moderate FRAX score: 10 year major osteoporotic risk: 16.7%. 10 year hip fracture risk: 5.2%. The thresholds for treatment are 20% and 3%, respectively. 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 Calcium accumulation in arthritic sites can confound the results of the bone density scan. L2 vertebra had to be excluded from analysis due to degenerative changes. Evaluation for secondary causes should be considered if clinically indicated. Recommend optimizing calcium (1200 mg/day) and vitamin D (800 IU/day) intake. Followup: Repeat BMD is appropriate after 2 years or after 1-2 years if starting treatment. WHO criteria for diagnosis of osteoporosis in postmenopausal women and in men 64 y/o or older: - normal: T-score -1.0 to + 1.0 - osteopenia/low bone density: T-score between -2.5 and -1.0 - osteoporosis: T-score below -2.5 - severe osteoporosis: T-score below -2.5 with history of fragility fracture Note: although not part of the WHO classification, the presence of a fragility fracture, regardless of the T-score, should be considered diagnostic of osteoporosis, provided other causes for the fracture have been excluded. Treatment: The National Osteoporosis Foundation recommends that treatment be considered in postmenopausal women and men age 37 or older with: 1. Hip or vertebral (clinical or morphometric) fracture 2. T-score of - 2.5 or lower at the spine or hip 3. 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      Follow-up: No follow-ups on file.  Sonda Primes, MD

## 2023-08-26 NOTE — Assessment & Plan Note (Signed)
 On B12

## 2023-08-26 NOTE — Assessment & Plan Note (Addendum)
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

## 2023-08-30 ENCOUNTER — Encounter: Payer: Self-pay | Admitting: Internal Medicine

## 2023-08-30 ENCOUNTER — Ambulatory Visit: Payer: Medicare HMO | Admitting: Dietician

## 2023-09-27 ENCOUNTER — Encounter: Payer: Self-pay | Admitting: Dietician

## 2023-09-27 ENCOUNTER — Encounter: Payer: Medicare HMO | Attending: Internal Medicine | Admitting: Dietician

## 2023-09-27 DIAGNOSIS — E118 Type 2 diabetes mellitus with unspecified complications: Secondary | ICD-10-CM | POA: Insufficient documentation

## 2023-09-27 NOTE — Progress Notes (Signed)
 Appointment start:  1615 Appointment End:  1645 She was lst seen by this RD on 04/04/2023  She states that she lost 16 lbs since diagnosis of diabetes but has regained some.  She states that she needs to start walking.  Patient attended Diabetes Core Classes 1-3 between 01/01/2023 and 01/15/2023 at [facility]. The purpose of the meeting today is to review information learned during those classes as well as review patient application and goals.   What are one or two positive things that you are doing right now to manage your diabetes?  Staying mindful when eating out, choosing unsweetened drinks  What is the hardest part about your diabetes right now, causing you the most concern, or is the most worrisome to you about your diabetes?  Food cravings  What questions do you have today?  None just needs motivation  Have you participated in any diabetes support group?  No but want to remain on the list  History:  Type 2 Diabetes, hypothyroid, history of colon cancer A1C:  6.6% 08/26/2023 and 03/19/2023 decreased from 7.8% on 12/13/2022, eGFR 71 08/26/2023 Medications/supplements include:  Omega 3, vitamin B-12, glucose support, probiotic, quercetin, magnesium, vitamin D3, vitamin K2, synthroid, rosuvastatin Sleep:  good Weight:   144 lbs 09/27/2023 147 lbs 04/04/2023  Blood Glucose:  daily  Fasting:  110-128    Social History:  Her son lives with her.  Neither her or patient cook much.  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, travel, and elections Exercise:  some walking,  staying busy organizing her home  24 hour diet recall: Breakfast: no sugar added blueberry muffin but usually oatmeal with nuts, cinnamon and occasional oatmeal OR occasional sausage and egg biscuit Snack:  occasional apple or nuts Lunch: salad with grilled chicken and mandarin oranges and a sweet potato Snack:  occasional skinny pop Dinner:  snacks - skinny pop and other snacks as she ate  out for lunch Snack:  occasional skinny pop Beverages:  water,  bia flavored water, unsweet tea with sugar sub, occasional diet soda   Specific focus but not limited to the following: Review of blood glucose monitoring and interpretation including the recommended target ranges and Hgb A1c.  Review of importance of regularly scheduled meals/snacks, and meal planning to improve quality of diet. Review of the effects of physical activity on glucose levels and long-term glucose control.  Recommended goal of 150 minutes of physical activity/week. Review of patient medications and discussed role of medication on blood glucose and possible side effects. Discussion of strategies to manage stress, psychosocial issues, and other obstacles to diabetes management. Review of prevention, detection, and treatment of long-term complications.    Continuing Goals: Resume walking Hydrate Increase your vegetable intake Nutrition quality of your snacks Continue mindfulness when eating out.  Future Follow up:  prn

## 2023-09-27 NOTE — Patient Instructions (Addendum)
 Resume walking  Hydrate  Increase your vegetable intake  Nutrition quality of your snacks  Continue mindfulness when eating out.

## 2023-11-22 ENCOUNTER — Other Ambulatory Visit: Payer: Self-pay | Admitting: Internal Medicine

## 2023-11-29 ENCOUNTER — Other Ambulatory Visit: Payer: Self-pay | Admitting: Internal Medicine

## 2023-12-19 ENCOUNTER — Telehealth: Payer: Self-pay | Admitting: Internal Medicine

## 2023-12-19 NOTE — Telephone Encounter (Signed)
 Copied from CRM [id number]. Topic: General - Other >> Dec 19, 2023 12:46 PM [name] wrote: Reason for CRM: Patient is calling because Medicare called her and told her she needed to get a urinalysis and to call us  so we can out the order in for her

## 2023-12-21 ENCOUNTER — Other Ambulatory Visit: Payer: Self-pay | Admitting: Internal Medicine

## 2024-01-20 ENCOUNTER — Other Ambulatory Visit: Payer: Self-pay | Admitting: Internal Medicine

## 2024-01-20 DIAGNOSIS — Z1231 Encounter for screening mammogram for malignant neoplasm of breast: Secondary | ICD-10-CM

## 2024-02-03 DIAGNOSIS — E119 Type 2 diabetes mellitus without complications: Secondary | ICD-10-CM | POA: Diagnosis not present

## 2024-02-03 DIAGNOSIS — Z961 Presence of intraocular lens: Secondary | ICD-10-CM | POA: Diagnosis not present

## 2024-02-03 LAB — HM DIABETES EYE EXAM

## 2024-02-19 ENCOUNTER — Ambulatory Visit
Admission: RE | Admit: 2024-02-19 | Discharge: 2024-02-19 | Disposition: A | Discharge status: Home / Self Care | Source: Ambulatory Visit | Attending: Internal Medicine | Admitting: Internal Medicine

## 2024-02-19 DIAGNOSIS — Z1231 Encounter for screening mammogram for malignant neoplasm of breast: Secondary | ICD-10-CM | POA: Diagnosis not present

## 2024-02-19 DIAGNOSIS — R92343 Mammographic extreme density, bilateral breasts: Secondary | ICD-10-CM | POA: Diagnosis not present

## 2024-02-21 ENCOUNTER — Other Ambulatory Visit: Payer: Self-pay | Admitting: Internal Medicine

## 2024-02-26 ENCOUNTER — Encounter: Payer: Self-pay | Admitting: Internal Medicine

## 2024-02-26 ENCOUNTER — Ambulatory Visit: Payer: Medicare HMO | Admitting: Internal Medicine

## 2024-02-26 VITALS — BP 135/70 | HR 63 | Temp 98.3°F | Ht 63.0 in | Wt 142.0 lb

## 2024-02-26 DIAGNOSIS — E538 Deficiency of other specified B group vitamins: Secondary | ICD-10-CM

## 2024-02-26 DIAGNOSIS — E034 Atrophy of thyroid (acquired): Secondary | ICD-10-CM

## 2024-02-26 DIAGNOSIS — E118 Type 2 diabetes mellitus with unspecified complications: Secondary | ICD-10-CM | POA: Diagnosis not present

## 2024-02-26 DIAGNOSIS — I2583 Coronary atherosclerosis due to lipid rich plaque: Secondary | ICD-10-CM | POA: Diagnosis not present

## 2024-02-26 DIAGNOSIS — Z0000 Encounter for general adult medical examination without abnormal findings: Secondary | ICD-10-CM

## 2024-02-26 NOTE — Progress Notes (Signed)
 Subjective:  Patient ID: Whitney [name], female    DOB: 02-04-1941  Age: 83 y.o. MRN: [id number]  CC: Medical Management of Chronic Issues (6 mnth f/u )   HPI NYKIRA REDDIX presents for on B12 def, hypothyroidism, anxiety  Outpatient Medications Prior to Visit  Medication Sig Dispense Refill   Accu-Chek FastClix Lancets MISC Use to check blood sugars twice a day as needed 102 each 0   Accu-Chek Softclix Lancets lancets USE AS INSTRUCTED TO CHECK BLOOD SUGAR 100 each 3   albuterol (VENTOLIN HFA) 108 (90 Base) MCG/ACT inhaler SMARTSIG:1 Puff(s) Via Inhaler Every 6 Hours PRN     aspirin  81 MG tablet Take 81 mg by mouth daily.     Blood Glucose Monitoring Suppl (ACCU-CHEK GUIDE) w/Device KIT Use to check blood sugars everyday 1 kit 0   cetirizine  (ZYRTEC ) 10 MG tablet TAKE 1 TABLET BY MOUTH EVERY DAY 30 tablet 5   Cholecalciferol (VITAMIN D3) 50 MCG (2000 UT) capsule Take 1 capsule (2,000 Units total) by mouth daily. 100 capsule 3   Cyanocobalamin  (VITAMIN B-12) 1000 MCG SUBL Place 1 tablet (1,000 mcg total) under the tongue daily. 100 tablet 3   glucose blood (ACCU-CHEK GUIDE TEST) test strip USE  STRIP TO CHECK GLUCOSE TWICE DAILY AS NEEDED 100 each 3   ipratropium (ATROVENT ) 0.03 % nasal spray INSTILL 2 SPRAYS INTO EACH NOSTRIL TWICE A DAY 30 mL 5   loratadine  (CLARITIN ) 10 MG tablet Take 1 tablet (10 mg total) by mouth daily. 30 tablet 11   MegaRed Omega-3 Krill Oil 500 MG CAPS Take 1 capsule by mouth every morning. 100 capsule 3   Multiple Vitamins-Minerals (WOMENS MULTI GUMMIES PO) Take by mouth.     Probiotic Product (PROBIOTIC ADVANCED PO) Take by mouth.     rosuvastatin  (CRESTOR ) 5 MG tablet Take 1 tablet by mouth once daily 90 tablet 3   SYNTHROID  125 MCG tablet Take 1 tablet by mouth once daily 90 tablet 2   No facility-administered medications prior to visit.    ROS: Review of Systems  Constitutional:  Negative for activity change, appetite change, chills,  fatigue and unexpected weight change.  HENT:  Negative for congestion, mouth sores and sinus pressure.   Eyes:  Negative for visual disturbance.  Respiratory:  Negative for cough and chest tightness.   Gastrointestinal:  Negative for abdominal pain and nausea.  Genitourinary:  Negative for difficulty urinating, frequency and vaginal pain.  Musculoskeletal:  Negative for arthralgias, back pain and gait problem.  Skin:  Negative for pallor and rash.  Neurological:  Negative for dizziness, tremors, weakness, numbness and headaches.  Psychiatric/Behavioral:  Negative for confusion, sleep disturbance and suicidal ideas.     Objective:  BP 135/70   Pulse 63   Temp 98.3 F (36.8 C) (Oral)   Ht 5' 3 (1.6 m)   Wt 142 lb (64.4 kg)   SpO2 97%   BMI 25.15 kg/m   BP Readings from Last 3 Encounters:  02/26/24 135/70  08/26/23 120/70  04/10/23 120/70    Wt Readings from Last 3 Encounters:  02/26/24 142 lb (64.4 kg)  08/26/23 141 lb (64 kg)  04/10/23 142 lb (64.4 kg)    Physical Exam Constitutional:      General: She is not in acute distress.    Appearance: She is well-developed.  HENT:     Head: Normocephalic.     Right Ear: External ear normal.     Left Ear: External ear normal.  Nose: Nose normal.  Eyes:     General:        Right eye: No discharge.        Left eye: No discharge.     Conjunctiva/sclera: Conjunctivae normal.     Pupils: Pupils are equal, round, and reactive to light.  Neck:     Thyroid : No thyromegaly.     Vascular: No JVD.     Trachea: No tracheal deviation.  Cardiovascular:     Rate and Rhythm: Normal rate and regular rhythm.     Heart sounds: Normal heart sounds.  Pulmonary:     Effort: No respiratory distress.     Breath sounds: No stridor. No wheezing.  Abdominal:     General: Bowel sounds are normal. There is no distension.     Palpations: Abdomen is soft. There is no mass.     Tenderness: There is no abdominal tenderness. There is no  guarding or rebound.  Musculoskeletal:        General: No tenderness.     Cervical back: Normal range of motion and neck supple. No rigidity.     Right lower leg: No edema.     Left lower leg: No edema.  Lymphadenopathy:     Cervical: No cervical adenopathy.  Skin:    Findings: No erythema or rash.  Neurological:     Mental Status: She is oriented to person, place, and time.     Cranial Nerves: No cranial nerve deficit.     Motor: No abnormal muscle tone.     Coordination: Coordination normal.     Deep Tendon Reflexes: Reflexes normal.  Psychiatric:        Behavior: Behavior normal.        Thought Content: Thought content normal.        Judgment: Judgment normal.     Lab Results  Component Value Date   WBC 9.8 12/13/2022   HGB 12.1 12/13/2022   HCT 37.0 12/13/2022   PLT 381.0 12/13/2022   GLUCOSE 99 08/26/2023   CHOL 117 12/13/2022   TRIG 206.0 (H) 12/13/2022   HDL 36.80 (L) 12/13/2022   LDLDIRECT 56.0 12/13/2022   LDLCALC 120 (H) 07/22/2019   ALT 22 08/26/2023   AST 21 08/26/2023   NA 140 08/26/2023   K 4.1 08/26/2023   CL 103 08/26/2023   CREATININE 0.78 08/26/2023   BUN 16 08/26/2023   CO2 30 08/26/2023   TSH 0.03 (L) 08/26/2023   HGBA1C 6.6 (H) 08/26/2023    MM 3D SCREENING MAMMOGRAM BILATERAL BREAST Result Date: 02/22/2024 CLINICAL DATA:  Screening. EXAM: DIGITAL SCREENING BILATERAL MAMMOGRAM WITH TOMOSYNTHESIS AND CAD TECHNIQUE: Bilateral screening digital craniocaudal and mediolateral oblique mammograms were obtained. Bilateral screening digital breast tomosynthesis was performed. The images were evaluated with computer-aided detection. COMPARISON:  Previous exam(s). ACR Breast Density Category a: The breasts are almost entirely fatty. FINDINGS: There are no findings suspicious for malignancy. IMPRESSION: No mammographic evidence of malignancy. A result letter of this screening mammogram will be mailed directly to the patient. RECOMMENDATION: Screening mammogram  in one year. (Code:SM-B-01Y) BI-RADS CATEGORY  1: Negative. Electronically Signed   By: Rosina Gelineau M.D.   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     Follow-up: Return in about 3 months (around 05/28/2024) for Wellness Exam.  Marolyn Noel, MD

## 2024-02-26 NOTE — Assessment & Plan Note (Signed)
On ASA, Krill oil, Crestor

## 2024-02-26 NOTE — Assessment & Plan Note (Signed)
 On B12

## 2024-02-26 NOTE — Assessment & Plan Note (Signed)
 Labs

## 2024-02-26 NOTE — Assessment & Plan Note (Signed)
 Chronic On Levothroid -

## 2024-03-12 ENCOUNTER — Telehealth: Payer: Self-pay

## 2024-03-12 NOTE — Telephone Encounter (Signed)
 Copied from CRM #[id number]. Topic: Clinical - Medical Advice >> Mar 12, 2024  2:44 PM [name] wrote: Reason for CRM: patient stated that she's having a medical issue and wants to speak with the nurse or doctor. She is requesting a call back

## 2024-03-12 NOTE — Telephone Encounter (Signed)
 Returned patients call and LVM for patient to call back

## 2024-03-16 NOTE — Telephone Encounter (Signed)
 Please see me or someone else in the office if not well yet.  Thanks

## 2024-03-17 NOTE — Telephone Encounter (Unsigned)
 Copied from CRM [id number]. Topic: General - Other >> Mar 17, 2024  3:31 PM Robinson H wrote: Reason for CRM: Patient returning call to [facility], patient stated she was still having issues and agent scheduled her for Friday with Corean Ku states she would be more comfortable with a female for that issue. Patient also wanted appointment for labs before AWV agent scheduled that as well.

## 2024-03-17 NOTE — Telephone Encounter (Signed)
 LVM for patient to call back. Following up with patients issue, if not better please schedule an appointment

## 2024-03-20 ENCOUNTER — Other Ambulatory Visit (INDEPENDENT_AMBULATORY_CARE_PROVIDER_SITE_OTHER)

## 2024-03-20 ENCOUNTER — Ambulatory Visit: Admitting: Family Medicine

## 2024-03-20 DIAGNOSIS — E034 Atrophy of thyroid (acquired): Secondary | ICD-10-CM

## 2024-03-20 DIAGNOSIS — E118 Type 2 diabetes mellitus with unspecified complications: Secondary | ICD-10-CM | POA: Diagnosis not present

## 2024-03-20 DIAGNOSIS — Z0000 Encounter for general adult medical examination without abnormal findings: Secondary | ICD-10-CM

## 2024-03-20 DIAGNOSIS — E538 Deficiency of other specified B group vitamins: Secondary | ICD-10-CM

## 2024-03-20 LAB — LIPID PANEL
Cholesterol: 106 mg/dL (ref 0–200)
HDL: 40.6 mg/dL (ref >39.00)
LDL Cholesterol: 48 mg/dL (ref 0–99)
NonHDL: 65.59
Total CHOL/HDL Ratio: 3
Triglycerides: 88 mg/dL (ref 0.0–149.0)
VLDL: 17.6 mg/dL (ref 0.0–40.0)

## 2024-03-20 LAB — CBC WITH DIFFERENTIAL/PLATELET
Basophils Absolute: 0.1 K/uL (ref 0.0–0.1)
Basophils Relative: 0.6 % (ref 0.0–3.0)
Eosinophils Absolute: 0.2 K/uL (ref 0.0–0.7)
Eosinophils Relative: 2.7 % (ref 0.0–5.0)
HCT: 37.6 % (ref 36.0–46.0)
Hemoglobin: 12.3 g/dL (ref 12.0–15.0)
Lymphocytes Relative: 24 % (ref 12.0–46.0)
Lymphs Abs: 2 K/uL (ref 0.7–4.0)
MCHC: 32.7 g/dL (ref 30.0–36.0)
MCV: 87.6 fl (ref 78.0–100.0)
Monocytes Absolute: 0.7 K/uL (ref 0.1–1.0)
Monocytes Relative: 8.3 % (ref 3.0–12.0)
Neutro Abs: 5.3 K/uL (ref 1.4–7.7)
Neutrophils Relative %: 64.4 % (ref 43.0–77.0)
Platelets: 324 K/uL (ref 150.0–400.0)
RBC: 4.28 Mil/uL (ref 3.87–5.11)
RDW: 13.8 % (ref 11.5–15.5)
WBC: 8.2 K/uL (ref 4.0–10.5)

## 2024-03-20 LAB — T4, FREE: Free T4: 1.23 ng/dL (ref 0.60–1.60)

## 2024-03-20 LAB — URINALYSIS, ROUTINE W REFLEX MICROSCOPIC
Bilirubin Urine: NEGATIVE
Hgb urine dipstick: NEGATIVE
Ketones, ur: NEGATIVE
Nitrite: NEGATIVE
Specific Gravity, Urine: 1.015 (ref 1.000–1.030)
Total Protein, Urine: NEGATIVE
Urine Glucose: NEGATIVE
Urobilinogen, UA: 0.2 (ref 0.0–1.0)
pH: 7 (ref 5.0–8.0)

## 2024-03-20 LAB — COMPREHENSIVE METABOLIC PANEL WITH GFR
ALT: 15 U/L (ref 0–35)
AST: 16 U/L (ref 0–37)
Albumin: 4.1 g/dL (ref 3.5–5.2)
Alkaline Phosphatase: 83 U/L (ref 39–117)
BUN: 11 mg/dL (ref 6–23)
CO2: 28 meq/L (ref 19–32)
Calcium: 9.2 mg/dL (ref 8.4–10.5)
Chloride: 102 meq/L (ref 96–112)
Creatinine, Ser: 0.75 mg/dL (ref 0.40–1.20)
GFR: 74.54 mL/min (ref >60.00)
Glucose, Bld: 105 mg/dL — ABNORMAL HIGH (ref 70–99)
Potassium: 4.3 meq/L (ref 3.5–5.1)
Sodium: 138 meq/L (ref 135–145)
Total Bilirubin: 0.4 mg/dL (ref 0.2–1.2)
Total Protein: 7.3 g/dL (ref 6.0–8.3)

## 2024-03-20 LAB — TSH: TSH: 0.09 u[IU]/mL — ABNORMAL LOW (ref 0.35–5.50)

## 2024-03-20 LAB — VITAMIN B12: Vitamin B-12: 685 pg/mL (ref 211–911)

## 2024-03-20 LAB — HEMOGLOBIN A1C: Hgb A1c MFr Bld: 6.7 % — ABNORMAL HIGH (ref 4.6–6.5)

## 2024-03-22 ENCOUNTER — Ambulatory Visit: Payer: Self-pay | Admitting: Internal Medicine

## 2024-03-27 ENCOUNTER — Ambulatory Visit (INDEPENDENT_AMBULATORY_CARE_PROVIDER_SITE_OTHER)

## 2024-03-27 VITALS — BP 130/84 | HR 59 | Ht 61.25 in | Wt 140.4 lb

## 2024-03-27 DIAGNOSIS — Z0000 Encounter for general adult medical examination without abnormal findings: Secondary | ICD-10-CM

## 2024-03-27 NOTE — Patient Instructions (Addendum)
 Whitney [name],  Thank you for taking the time for your Medicare Wellness Visit. I appreciate your continued commitment to your health goals. Please review the care plan we discussed, and feel free to reach out if I can assist you further.  Medicare recommends these wellness visits once per year to help you and your care team stay ahead of potential health issues. These visits are designed to focus on prevention, allowing your provider to concentrate on managing your acute and chronic conditions during your regular appointments.  Please note that Annual Wellness Visits do not include a physical exam. Some assessments may be limited, especially if the visit was conducted virtually. If needed, we may recommend a separate in-person follow-up with your provider.  Ongoing Care Seeing your primary care provider every 3 to 6 months helps us  monitor your health and provide consistent, personalized care. Last office visit on 03/07/2024.  Referrals If a referral was made during today's visit and you haven't received any updates within two weeks, please contact the referred provider directly to check on the status.  Recommended Screenings:  Health Maintenance  Topic Date Due   Complete foot exam   Never done   Yearly kidney health urinalysis for diabetes  Never done   Zoster (Shingles) Vaccine (1 of 2) 07/25/1961   Medicare Annual Wellness Visit  04/04/2024   Hemoglobin A1C  09/19/2024   Eye exam for diabetics  02/02/2025   Yearly kidney function blood test for diabetes  03/20/2025   Pneumococcal Vaccine for age over 62  Completed   DEXA scan (bone density measurement)  Completed   HPV Vaccine  Aged Out   Meningitis B Vaccine  Aged Out   DTaP/Tdap/Td vaccine  Discontinued   Flu Shot  Discontinued   COVID-19 Vaccine  Discontinued       04/05/2023   11:12 AM  Advanced Directives  Does Patient Have a Medical Advance Directive? Yes  Type of Estate agent of [place];Living  will   Advance Care Planning is important because it: Ensures you receive medical care that aligns with your values, goals, and preferences. Provides guidance to your family and loved ones, reducing the emotional burden of decision-making during critical moments.  Vision: Annual vision screenings are recommended for early detection of glaucoma, cataracts, and diabetic retinopathy. These exams can also reveal signs of chronic conditions such as diabetes and high blood pressure.  Dental: Annual dental screenings help detect early signs of oral cancer, gum disease, and other conditions linked to overall health, including heart disease and diabetes.  Please see the attached documents for additional preventive care recommendations. You are due for a foot exam and a kidney evaluation (a urine sample), which you will have done during your next office visit with [name].  Remember to try Metamucil for fiber ingestion.

## 2024-03-27 NOTE — Progress Notes (Signed)
 Subjective:   Whitney [name] is a 83 y.o. who presents for a Medicare Wellness preventive visit.  As a reminder, Annual Wellness Visits don't include a physical exam, and some assessments may be limited, especially if this visit is performed virtually. We may recommend an in-person follow-up visit with your provider if needed.  Visit Complete: In person  Persons Participating in Visit: Patient.  AWV Questionnaire: Yes: Patient Medicare AWV questionnaire was completed by the patient on 03/27/2024; I have confirmed that all information answered by patient is correct and no changes since this date.  Cardiac Risk Factors include: advanced age (>77men, >37 women);diabetes mellitus;Other (see comment), Risk factor comments: coronary atherosclerosis     Objective:    Today's Vitals   03/27/24 1440  Weight: 140 lb 6.4 oz (63.7 kg)  Height: 5' 1.25 (1.556 m)   Body mass index is 26.31 kg/m.     03/27/2024    2:49 PM 04/05/2023   11:12 AM 01/01/2023    3:24 PM 03/19/2022    2:02 PM 03/15/2021    3:34 PM 03/26/2020   11:53 AM  Advanced Directives  Does Patient Have a Medical Advance Directive? Yes Yes Yes Yes Yes Yes  Type of Estate agent of [place];Living will Healthcare Power of [place];Living will  Healthcare Power of [place];Living will Living will;Healthcare Power of Attorney   Does patient want to make changes to medical advance directive?   No - Patient declined No - Patient declined    Copy of Healthcare Power of Attorney in Chart? No - copy requested   No - copy requested No - copy requested     Current Medications (verified) Outpatient Encounter Medications as of 03/27/2024  Medication Sig   Accu-Chek FastClix Lancets MISC Use to check blood sugars twice a day as needed   Accu-Chek Softclix Lancets lancets USE AS INSTRUCTED TO CHECK BLOOD SUGAR   albuterol (VENTOLIN HFA) 108 (90 Base) MCG/ACT inhaler SMARTSIG:1 Puff(s) Via Inhaler Every 6 Hours PRN    aspirin  81 MG tablet Take 81 mg by mouth daily.   Blood Glucose Monitoring Suppl (ACCU-CHEK GUIDE) w/Device KIT Use to check blood sugars everyday   cetirizine  (ZYRTEC ) 10 MG tablet TAKE 1 TABLET BY MOUTH EVERY DAY   Cholecalciferol (VITAMIN D3) 50 MCG (2000 UT) capsule Take 1 capsule (2,000 Units total) by mouth daily.   Cyanocobalamin  (VITAMIN B-12) 1000 MCG SUBL Place 1 tablet (1,000 mcg total) under the tongue daily.   glucose blood (ACCU-CHEK GUIDE TEST) test strip USE  STRIP TO CHECK GLUCOSE TWICE DAILY AS NEEDED   ipratropium (ATROVENT ) 0.03 % nasal spray INSTILL 2 SPRAYS INTO EACH NOSTRIL TWICE A DAY   loratadine  (CLARITIN ) 10 MG tablet Take 1 tablet (10 mg total) by mouth daily.   MegaRed Omega-3 Krill Oil 500 MG CAPS Take 1 capsule by mouth every morning.   Multiple Vitamins-Minerals (WOMENS MULTI GUMMIES PO) Take by mouth.   Probiotic Product (PROBIOTIC ADVANCED PO) Take by mouth.   rosuvastatin  (CRESTOR ) 5 MG tablet Take 1 tablet by mouth once daily   SYNTHROID  125 MCG tablet Take 1 tablet by mouth once daily   No facility-administered encounter medications on file as of 03/27/2024.    Allergies (verified) Benzocaine-menthol, Codeine phosphate, and Penicillins   History: Past Medical History:  Diagnosis Date   Anemia    Iron def   B12 deficiency    Cancer (HCC)    colon ca   Depression    Diabetes mellitus  without complication (HCC)    History of colon cancer 2008   Hypothyroidism    Uterine polyp    Past Surgical History:  Procedure Laterality Date   BREAST BIOPSY Right 10/08/1995   Benign Excisional Biopsy   CHOLECYSTECTOMY     COLON SURGERY  2008   Resection   Family History  Problem Relation Age of Onset   Hypertension Mother    Heart disease Mother 63       CHF   Heart disease Father 52       MI died   Heart disease Other        CAD and CHF   Breast cancer Cousin    Social History   Socioeconomic History   Marital status: Widowed    Spouse  name: Not on file   Number of children: Not on file   Years of education: Not on file   Highest education level: Not on file  Occupational History   Occupation: RETIRED  Tobacco Use   Smoking status: Former   Smokeless tobacco: Never  Vaping Use   Vaping status: Never Used  Substance and Sexual Activity   Alcohol use: No   Drug use: No   Sexual activity: Yes  Other Topics Concern   Not on file  Social History Narrative   Lives with son/2025 and 1 dog   Social Drivers of Health   Financial Resource Strain: Low Risk  (03/27/2024)   Overall Financial Resource Strain (CARDIA)    Difficulty of Paying Living Expenses: Not hard at all  Food Insecurity: No Food Insecurity (03/27/2024)   Hunger Vital Sign    Worried About Running Out of Food in the Last Year: Never true    Ran Out of Food in the Last Year: Never true  Transportation Needs: No Transportation Needs (03/27/2024)   PRAPARE - Administrator, Civil Service (Medical): No    Lack of Transportation (Non-Medical): No  Physical Activity: Inactive (03/27/2024)   Exercise Vital Sign    Days of Exercise per Week: 0 days    Minutes of Exercise per Session: 0 min  Stress: No Stress Concern Present (03/27/2024)   [employer] of Occupational Health - Occupational Stress Questionnaire    Feeling of Stress: Not at all  Social Connections: Moderately Integrated (03/27/2024)   Social Connection and Isolation Panel    Frequency of Communication with Friends and Family: More than three times a week    Frequency of Social Gatherings with Friends and Family: More than three times a week    Attends Religious Services: More than 4 times per year    Active Member of [employer] or Organizations: Yes    Attends Banker Meetings: More than 4 times per year    Marital Status: Widowed    Tobacco Counseling Counseling given: Not Answered    Clinical Intake:  Pre-visit preparation completed: Yes  Pain : No/denies pain      BMI - recorded: 26.31 Nutritional Status: BMI 25 -29 Overweight Diabetes: Yes CBG done?: Yes (120 per pt) CBG resulted in Enter/ Edit results?: No Did pt. bring in CBG monitor from home?: No  Lab Results  Component Value Date   HGBA1C 6.7 (H) 03/20/2024   HGBA1C 6.6 (H) 08/26/2023   HGBA1C 6.6 (H) 03/19/2023     How often do you need to have someone help you when you read instructions, pamphlets, or other written materials from your doctor or pharmacy?: 1 - Never  Interpreter Needed?: No  Information entered by :: Shuan Statzer, RMA   Activities of Daily Living     03/27/2024    2:45 PM 04/05/2023   11:04 AM  In your present state of health, do you have any difficulty performing the following activities:  Hearing? 0 0  Vision? 0 0  Difficulty concentrating or making decisions? 0 0  Walking or climbing stairs? 0 0  Dressing or bathing? 0 0  Doing errands, shopping? 0 0  Preparing Food and eating ? N N  Using the Toilet? N N  In the past six months, have you accidently leaked urine? N N  Do you have problems with loss of bowel control? N N  Managing your Medications? N N  Managing your Finances? N N  Housekeeping or managing your Housekeeping? N N    Patient Care Team: Plotnikov, Karlynn GAILS, MD as PCP - General Marget Lenis, MD as Attending Physician (Obstetrics and Gynecology) Rosalie Kitchens, MD (Gastroenterology) Odogwu, Graylin FERNS, MD (Hematology and Oncology)  I have updated your Care Teams any recent Medical Services you may have received from other providers in the past year.     Assessment:   This is a routine wellness examination for Whitney [name].  Hearing/Vision screen Hearing Screening - Comments:: Denies hearing difficulties   Vision Screening - Comments:: Had cataract surgery/ Dr. Octavia   Goals Addressed               This Visit's Progress     Patient Stated (pt-stated)   On track     To maintain my current health status by continuing to eat  healthy, stay physically active and socially active.       Depression Screen     03/27/2024    2:53 PM 02/26/2024    2:54 PM 04/10/2023    2:42 PM 04/05/2023   11:05 AM 01/21/2023   10:52 AM 01/01/2023    3:23 PM 12/11/2022    3:58 PM  PHQ 2/9 Scores  PHQ - 2 Score 0 0 0 0 0 0 0  PHQ- 9 Score 0          Fall Risk     03/27/2024    2:50 PM 02/26/2024    2:53 PM 04/10/2023    2:41 PM 04/05/2023   11:05 AM 01/21/2023   10:51 AM  Fall Risk   Falls in the past year? 0 0 0 0 0  Number falls in past yr: 0 0 0 0 0  Injury with Fall? 0 0 0 0 0  Risk for fall due to :  No Fall Risks No Fall Risks No Fall Risks No Fall Risks  Follow up Falls evaluation completed;Falls prevention discussed Falls evaluation completed Falls evaluation completed Falls evaluation completed Falls evaluation completed    MEDICARE RISK AT HOME:  Medicare Risk at Home Any stairs in or around the home?: Yes (going in front door) If so, are there any without handrails?: Yes Home free of loose throw rugs in walkways, pet beds, electrical cords, etc?: Yes Adequate lighting in your home to reduce risk of falls?: Yes Life alert?: No Use of a cane, walker or w/c?: No Grab bars in the bathroom?: No Shower chair or bench in shower?: Yes Elevated toilet seat or a handicapped toilet?: Yes  TIMED UP AND GO:  Was the test performed?  Yes  Length of time to ambulate 10 feet: 20 sec Gait steady and fast without use of assistive device  Cognitive Function: Declined/Normal: No cognitive concerns noted by patient or family. Patient alert, oriented, able to answer questions appropriately and recall recent events. No signs of memory loss or confusion.        04/05/2023   11:20 AM 03/19/2022    1:50 PM  6CIT Screen  What Year? 0 points 0 points  What month? 0 points 0 points  What time? 0 points 0 points  Count back from 20 0 points 0 points  Months in reverse 0 points 0 points  Repeat phrase 0 points 0 points  Total Score 0  points 0 points    Immunizations Immunization History  Administered Date(s) Administered   Influenza Split 05/07/2008, 05/21/2008, 05/05/2009   Influenza Whole 05/07/2008, 05/21/2008, 05/05/2009   Pneumococcal Conjugate-13 12/17/2014   Pneumococcal Polysaccharide-23 05/21/2008, 03/01/2016   Tdap 07/09/2011   Zoster, Live 08/01/2014    Screening Tests Health Maintenance  Topic Date Due   FOOT EXAM  Never done   Diabetic kidney evaluation - Urine ACR  Never done   Zoster Vaccines- Shingrix (1 of 2) 07/25/1961   Medicare Annual Wellness (AWV)  04/04/2024   HEMOGLOBIN A1C  09/19/2024   OPHTHALMOLOGY EXAM  02/02/2025   Diabetic kidney evaluation - eGFR measurement  03/20/2025   Pneumococcal Vaccine: 50+ Years  Completed   DEXA SCAN  Completed   HPV VACCINES  Aged Out   Meningococcal B Vaccine  Aged Out   DTaP/Tdap/Td  Discontinued   Influenza Vaccine  Discontinued   COVID-19 Vaccine  Discontinued    Health Maintenance Items Addressed: Diabetic Foot Exam recommended, Labs Ordered: UACR, See Nurse Notes at the end of this note  Additional Screening:  Vision Screening: Recommended annual ophthalmology exams for early detection of glaucoma and other disorders of the eye. Is the patient up to date with their annual eye exam?  No  Who is the provider or what is the name of the office in which the patient attends annual eye exams? Dr. Octavia  Dental Screening: Recommended annual dental exams for proper oral hygiene  Community Resource Referral / Chronic Care Management: CRR required this visit?  No   CCM required this visit?  No   Plan:    I have personally reviewed and noted the following in the patient's chart:   Medical and social history Use of alcohol, tobacco or illicit drugs  Current medications and supplements including opioid prescriptions. Patient is not currently taking opioid prescriptions. Functional ability and status Nutritional status Physical  activity Advanced directives List of other physicians Hospitalizations, surgeries, and ER visits in previous 12 months Vitals Screenings to include cognitive, depression, and falls Referrals and appointments  In addition, I have reviewed and discussed with patient certain preventive protocols, quality metrics, and best practice recommendations. A written personalized care plan for preventive services as well as general preventive health recommendations were provided to patient.   Whitney [name], CMA   03/27/2024   After Visit Summary: (In Person-Printed) AVS printed and given to the patient  Notes: Patient is due for a foot exam and a UACR, which order has been placed today.  These can be done during her next visit with [name].  Patient had concerns about her having hemorrhoids.  She has been scheduled to see Dr. Geofm on Monday to discuss and be evaluated.  Patient had no other concerns to address today.

## 2024-03-29 NOTE — Progress Notes (Unsigned)
    Subjective:    Patient ID: Whitney [name], female    DOB: Feb 14, 1941, 83 y.o.   MRN: [id number]      HPI [place] is here for No chief complaint on file.  Hemorrhoids ?    Last colonoscopy 2020 with [facility]-diverticulosis.  Small internal and external hemorrhoids.  Medications and allergies reviewed with patient and updated if appropriate.  Current Outpatient Medications on File Prior to Visit  Medication Sig Dispense Refill   Accu-Chek FastClix Lancets MISC Use to check blood sugars twice a day as needed 102 each 0   Accu-Chek Softclix Lancets lancets USE AS INSTRUCTED TO CHECK BLOOD SUGAR 100 each 3   albuterol (VENTOLIN HFA) 108 (90 Base) MCG/ACT inhaler SMARTSIG:1 Puff(s) Via Inhaler Every 6 Hours PRN     aspirin  81 MG tablet Take 81 mg by mouth daily.     Blood Glucose Monitoring Suppl (ACCU-CHEK GUIDE) w/Device KIT Use to check blood sugars everyday 1 kit 0   cetirizine  (ZYRTEC ) 10 MG tablet TAKE 1 TABLET BY MOUTH EVERY DAY 30 tablet 5   Cholecalciferol (VITAMIN D3) 50 MCG (2000 UT) capsule Take 1 capsule (2,000 Units total) by mouth daily. 100 capsule 3   Cyanocobalamin  (VITAMIN B-12) 1000 MCG SUBL Place 1 tablet (1,000 mcg total) under the tongue daily. 100 tablet 3   glucose blood (ACCU-CHEK GUIDE TEST) test strip USE  STRIP TO CHECK GLUCOSE TWICE DAILY AS NEEDED 100 each 3   ipratropium (ATROVENT ) 0.03 % nasal spray INSTILL 2 SPRAYS INTO EACH NOSTRIL TWICE A DAY 30 mL 5   loratadine  (CLARITIN ) 10 MG tablet Take 1 tablet (10 mg total) by mouth daily. 30 tablet 11   MegaRed Omega-3 Krill Oil 500 MG CAPS Take 1 capsule by mouth every morning. 100 capsule 3   Multiple Vitamins-Minerals (WOMENS MULTI GUMMIES PO) Take by mouth.     Probiotic Product (PROBIOTIC ADVANCED PO) Take by mouth.     rosuvastatin  (CRESTOR ) 5 MG tablet Take 1 tablet by mouth once daily 90 tablet 3   SYNTHROID  125 MCG tablet Take 1 tablet by mouth once daily 90 tablet 2   No current  facility-administered medications on file prior to visit.    Review of Systems     Objective:  There were no vitals filed for this visit. BP Readings from Last 3 Encounters:  03/27/24 130/84  02/26/24 135/70  08/26/23 120/70   Wt Readings from Last 3 Encounters:  03/27/24 140 lb 6.4 oz (63.7 kg)  02/26/24 142 lb (64.4 kg)  08/26/23 141 lb (64 kg)   There is no height or weight on file to calculate BMI.    Physical Exam         Assessment & Plan:    See Problem List for Assessment and Plan of chronic medical problems.

## 2024-03-30 ENCOUNTER — Encounter: Payer: Self-pay | Admitting: Internal Medicine

## 2024-03-30 ENCOUNTER — Ambulatory Visit (INDEPENDENT_AMBULATORY_CARE_PROVIDER_SITE_OTHER): Admitting: Internal Medicine

## 2024-03-30 VITALS — BP 136/76 | HR 70 | Temp 98.0°F | Ht 61.25 in | Wt 138.0 lb

## 2024-03-30 DIAGNOSIS — K645 Perianal venous thrombosis: Secondary | ICD-10-CM | POA: Diagnosis not present

## 2024-03-30 MED ORDER — HYDROCORTISONE 2.5 % EX OINT: TOPICAL_OINTMENT | Freq: Two times a day (BID) | CUTANEOUS | 0 refills | Status: DC

## 2024-03-30 NOTE — Patient Instructions (Addendum)
 Medications changes include :   hydrocortisone  2.5 % ointment       Return if symptoms worsen or fail to improve.    Hemorrhoids Hemorrhoids are swollen veins that may form: In the butt (rectum). These are called internal hemorrhoids. Around the opening of the butt (anus). These are called external hemorrhoids. Most hemorrhoids do not cause very bad problems. They often get better with changes to your lifestyle and what you eat. What are the causes? Having trouble pooping (constipation) or watery poop (diarrhea). Pushing too hard when you poop. Pregnancy. Being very overweight (obese). Sitting for too long. Riding a bike for a long time. Heavy lifting or other things that take a lot of effort. Anal sex. What are the signs or symptoms? Pain. Itching or soreness in the butt. Bleeding from the butt. Leaking poop. Swelling. One or more lumps around the opening of your butt. How is this treated? In most cases, hemorrhoids can be treated at home. You may be told to: Change what you eat. Make changes to your lifestyle. If these treatments do not help, you may need to have a procedure done. Your doctor may need to: Place rubber bands at the bottom of the hemorrhoids to make them fall off. Put medicine into the hemorrhoids to shrink them. Shine a type of light on the hemorrhoids to cause them to fall off. Do surgery to get rid of the hemorrhoids. Follow these instructions at home: Medicines Take over-the-counter and prescription medicines only as told by your doctor. Use creams with medicine in them or medicines that you put in your butt as told by your doctor. Eating and drinking  Eat foods that have a lot of fiber in them. These include whole grains, beans, nuts, fruits, and vegetables. Ask your doctor about taking products that have fiber added to them (fibersupplements). Take in less fat. You can do this by: Eating low-fat dairy products. Eating less red  meat. Staying away from processed foods. Drink enough fluid to keep your pee (urine) pale yellow. Managing pain and swelling  Take a warm-water bath (sitz bath) for 20 minutes to ease pain. Do this 3-4 times a day. You may do this in a bathtub. You may also use a portable sitz bath that fits over the toilet. If told, put ice on the painful area. It may help to use ice between your warm baths. Put ice in a plastic bag. Place a towel between your skin and the bag. Leave the ice on for 20 minutes, 2-3 times a day. If your skin turns bright red, take off the ice right away to prevent skin damage. The risk of damage is higher if you cannot feel pain, heat, or cold. General instructions Exercise. Ask your doctor how much and what kind of exercise is best for you. Go to the bathroom when you need to poop. Do not wait. Try not to push too hard when you poop. Keep your butt dry and clean. Use wet toilet paper or moist towelettes after you poop. Do not sit on the toilet for a long time. Contact a doctor if: You have pain and swelling that do not get better with treatment. You have trouble pooping. You cannot poop. You have pain or swelling outside the area of the hemorrhoids. Get help right away if: You have bleeding from the butt that will not stop. This information is not intended to replace advice given to you by your health care provider.  Make sure you discuss any questions you have with your health care provider. Document Revised: 03/21/2022 Document Reviewed: 03/21/2022 Elsevier Patient Education  2024 [employer].

## 2024-04-09 ENCOUNTER — Encounter: Payer: Self-pay | Admitting: Internal Medicine

## 2024-04-09 ENCOUNTER — Ambulatory Visit: Payer: Self-pay

## 2024-04-09 NOTE — Telephone Encounter (Signed)
 FYI Only or Action Required?: FYI only for provider.  Patient was last seen in primary care on 03/30/2024 by Geofm Glade PARAS, MD.  Called Nurse Triage reporting Hemorrhoids and Constipation.  Symptoms began several weeks ago.  Interventions attempted: OTC medications: warm sitz bath and Prescription medications: Colace, hydrocortisone  cream.  Symptoms are: hemorrhoids with rectal pain, constipation (hard small stools) gradually worsening since yesterday.  Triage Disposition: See PCP When Office is Open (Within 3 Days)  Patient/caregiver understands and will follow disposition?: Yes      Reason for Disposition  [1] Mild swelling of abdomen (e.g., looks distended or swollen) AND [2] new-onset or getting worse  Answer Assessment - Initial Assessment Questions 1. STOOL PATTERN OR FREQUENCY: How often do you have a bowel movement (BM)?  (Normal range: 3 times a day to every 3 days)  When was your last BM?       2-3 days between BM's. Last BM was today, she states she had several small, stringy or feather like stools since last night.  2. STRAINING: Do you have to strain to have a BM?      Yes.  3. ONSET: When did the constipation begin?     Yesterday.  4. RECTAL PAIN: Does your rectum hurt when the stool comes out? If Yes, ask: Do you have hemorrhoids? How bad is the pain?  (Scale 1-10; or mild, moderate, severe)     Yes, it is painful and states she has hemorrhoids. 8-9/10.  5. BM COMPOSITION: Are the stools hard?      Yes.  6. BLOOD ON STOOLS: Has there been any blood on the toilet tissue or on the surface of the BM? If Yes, ask: When was the last time?     Not today, she states she thinks there was a little bit yesterday.  7. CHRONIC CONSTIPATION: Is this a new problem for you?  If No, ask: How long have you had this problem? (days, weeks, months)      No, off and on for years.  8. CHANGES IN DIET OR HYDRATION: Have there been any recent changes in your  diet? How much fluids are you drinking on a daily basis?  How much have you had to drink today?     She thinks she has had less fluid intake. Today she has had a little over a bottle of water.  9. MEDICINES: Have you been taking any new medicines? Are you taking any narcotic pain medicines? (e.g., Dilaudid, morphine, Percocet, Vicodin)     No.  10. LAXATIVES: Have you been using any stool softeners, laxatives, or enemas?  If Yes, ask What are you using, how often, and when was the last time?       Colace (stool softener) 1 daily for over a week.  11. ACTIVITY:  How much walking do you do every day?  Has your activity level decreased in the past week?        Yesterday she states she got a lot of walking in. She states she is an active person and does all her errands and is active around her home.  12. CAUSE: What do you think is causing the constipation?        Unsure.  13. MEDICAL HISTORY: Do you have a history of hemorrhoids, rectal fissures, rectal surgery, or rectal abscess?         Hemorrhoids.  14. OTHER SYMPTOMS: Do you have any other symptoms? (e.g., abdomen pain, bloating, fever, vomiting)  Urinary frequency, bloating/puffy (soft abdomen). Denies painful urination, blood in urine, fever, nausea, vomiting.  15. PREGNANCY: Is there any chance you are pregnant? When was your last menstrual period?       N/A.  Patient also asked about how much epsom salt to use for a sitz bath for her hemorrhoids. Advised about a half cup of epsom salt for the sitz bath.  Protocols used: Constipation-A-AH

## 2024-04-09 NOTE — Telephone Encounter (Signed)
   Message from Franky GRADE sent at 04/09/2024  9:25 AM EDT  Patient is experiencing issues with going to the bathroom, she did not feel comfortable disclosing more information. Possible diarrhea.

## 2024-04-09 NOTE — Progress Notes (Unsigned)
    Subjective:    Patient ID: Whitney [name], female    DOB: 1941/02/21, 83 y.o.   MRN: [id number]      HPI [place] is here for No chief complaint on file.        Medications and allergies reviewed with patient and updated if appropriate.  Current Outpatient Medications on File Prior to Visit  Medication Sig Dispense Refill   Accu-Chek FastClix Lancets MISC Use to check blood sugars twice a day as needed 102 each 0   Accu-Chek Softclix Lancets lancets USE AS INSTRUCTED TO CHECK BLOOD SUGAR 100 each 3   albuterol (VENTOLIN HFA) 108 (90 Base) MCG/ACT inhaler SMARTSIG:1 Puff(s) Via Inhaler Every 6 Hours PRN     aspirin  81 MG tablet Take 81 mg by mouth daily.     Blood Glucose Monitoring Suppl (ACCU-CHEK GUIDE) w/Device KIT Use to check blood sugars everyday 1 kit 0   cetirizine  (ZYRTEC ) 10 MG tablet TAKE 1 TABLET BY MOUTH EVERY DAY 30 tablet 5   Cholecalciferol (VITAMIN D3) 50 MCG (2000 UT) capsule Take 1 capsule (2,000 Units total) by mouth daily. 100 capsule 3   Cyanocobalamin  (VITAMIN B-12) 1000 MCG SUBL Place 1 tablet (1,000 mcg total) under the tongue daily. 100 tablet 3   Docusate Sodium (COLACE PO) Take by mouth daily before breakfast.     glucose blood (ACCU-CHEK GUIDE TEST) test strip USE  STRIP TO CHECK GLUCOSE TWICE DAILY AS NEEDED 100 each 3   hydrocortisone  2.5 % ointment Apply topically 2 (two) times daily. 30 g 0   ipratropium (ATROVENT ) 0.03 % nasal spray INSTILL 2 SPRAYS INTO EACH NOSTRIL TWICE A DAY 30 mL 5   loratadine  (CLARITIN ) 10 MG tablet Take 1 tablet (10 mg total) by mouth daily. 30 tablet 11   MegaRed Omega-3 Krill Oil 500 MG CAPS Take 1 capsule by mouth every morning. 100 capsule 3   Multiple Vitamins-Minerals (WOMENS MULTI GUMMIES PO) Take by mouth.     Probiotic Product (PROBIOTIC ADVANCED PO) Take by mouth.     rosuvastatin  (CRESTOR ) 5 MG tablet Take 1 tablet by mouth once daily 90 tablet 3   SYNTHROID  125 MCG tablet Take 1 tablet by mouth once  daily 90 tablet 2   No current facility-administered medications on file prior to visit.    Review of Systems     Objective:  There were no vitals filed for this visit. BP Readings from Last 3 Encounters:  03/30/24 136/76  03/27/24 130/84  02/26/24 135/70   Wt Readings from Last 3 Encounters:  03/30/24 138 lb (62.6 kg)  03/27/24 140 lb 6.4 oz (63.7 kg)  02/26/24 142 lb (64.4 kg)   There is no height or weight on file to calculate BMI.    Physical Exam         Assessment & Plan:    See Problem List for Assessment and Plan of chronic medical problems.

## 2024-04-10 ENCOUNTER — Ambulatory Visit (INDEPENDENT_AMBULATORY_CARE_PROVIDER_SITE_OTHER): Admitting: Internal Medicine

## 2024-04-10 VITALS — BP 132/78 | HR 65 | Temp 98.0°F | Ht 61.25 in | Wt 139.0 lb

## 2024-04-10 DIAGNOSIS — K5909 Other constipation: Secondary | ICD-10-CM | POA: Diagnosis not present

## 2024-04-10 DIAGNOSIS — K645 Perianal venous thrombosis: Secondary | ICD-10-CM | POA: Diagnosis not present

## 2024-04-10 DIAGNOSIS — R350 Frequency of micturition: Secondary | ICD-10-CM

## 2024-04-10 MED ORDER — HYDROCORTISONE 2.5 % EX OINT: TOPICAL_OINTMENT | Freq: Two times a day (BID) | CUTANEOUS | 0 refills | Status: DC

## 2024-04-10 NOTE — Patient Instructions (Addendum)
      Try Benefiber daily - 1-2 tsp daily.  Drink lots of water.  Take colace 1-2 tabs a day.    If no bowel movement in 2 days - take miralax.    For your constipation you can also try  --   Smooth move tea Magnesium glycinate supplement over the counter

## 2024-04-13 ENCOUNTER — Telehealth: Payer: Self-pay

## 2024-04-13 NOTE — Telephone Encounter (Signed)
 Copied from CRM [id number]. Topic: Clinical - Medical Advice >> Apr 13, 2024 11:43 AM Burnard DEL wrote: Reason for CRM: Patient is requesting a phone call to discuss the medication that was prescribed to help her use the bathroom.

## 2024-04-14 ENCOUNTER — Ambulatory Visit: Payer: Self-pay

## 2024-04-14 NOTE — Telephone Encounter (Signed)
 FYI Only or Action Required?: FYI only for provider.  Patient was last seen in primary care on 04/10/2024 by Geofm Glade PARAS, MD.  Called Nurse Triage reporting Advice Only.   Triage Disposition: Information or Advice Only Call  Patient/caregiver understands and will follow disposition?: Yes    Reason for Disposition  Health information question, no triage required and triager able to answer question  Answer Assessment - Initial Assessment Questions 1. REASON FOR CALL: What is the main reason for your call? or How can I best help you?   Patient called to inquire when to take medication to help get her bowels going.  Nurse informed pt to take early in the day to try to prevent having to go to the bathroom at night.pt verbalized understanding.  Protocols used: Information Only Call - No Triage-A-AH

## 2024-04-14 NOTE — Telephone Encounter (Signed)
 Copied from CRM [id number]. Topic: Clinical - Medical Advice >> Apr 14, 2024 11:06 AM Rea BROCKS wrote: Reason for CRM: Patient would like to receive medical advice from a nurse.   [phone] BENNIE)

## 2024-04-14 NOTE — Telephone Encounter (Signed)
 This RN made 3rd attempt to reach patient, left message with call back number.

## 2024-04-17 NOTE — Telephone Encounter (Signed)
 Returned patients call to discuss medication.

## 2024-05-08 ENCOUNTER — Other Ambulatory Visit: Payer: Self-pay | Admitting: Internal Medicine

## 2024-06-01 ENCOUNTER — Ambulatory Visit: Payer: Self-pay

## 2024-06-01 ENCOUNTER — Ambulatory Visit: Payer: Self-pay | Admitting: Internal Medicine

## 2024-06-01 ENCOUNTER — Ambulatory Visit: Payer: Self-pay | Admitting: *Deleted

## 2024-06-01 NOTE — Telephone Encounter (Signed)
 FYI Only or Action Required?: FYI only for provider: ED advised and patient requesting to have PCP recommendations before going to ED. SABRA  Patient was last seen in primary care on 04/10/2024 by Geofm Glade PARAS, MD.  Called Nurse Triage reporting Hemorrhoids.  Symptoms began several weeks ago.  Interventions attempted: OTC medications: tylenol, ibuprofen, sitz baths not effective , taking stool softeners, colace daily and Rest, hydration, or home remedies.  Symptoms are: gradually worsening.  Triage Disposition: Go to ED Now (Notify PCP)  Patient/caregiver understands and will follow disposition?: No, wishes to speak with PCP   Advised ED due to protruding hemorrhoid. Patient declined and would like PCP recommendations. CAL notified.    Copied from CRM [id number]. Topic: Clinical - Red Word Triage >> Jun 01, 2024  8:51 AM Eva FALCON wrote: Red Word that prompted transfer to Nurse Triage: pain with Hemorrhoids, hurts when sitting, also has some bleeding. Reason for Disposition  Large mass protruding out of rectum  Answer Assessment - Initial Assessment Questions Recommended ED. Patient declined for now and requesting PCP recommendations if she can be seen in office.  CAL notified.       1. SYMPTOM:  What's the main symptom you're concerned about? (e.g., pain, itching, swelling, rash)     Rectal pain  2. ONSET: When did the sx/ pain   start?     Several weeks  3. RECTAL PAIN: Do you have any pain around your rectum? How bad is the pain?  (Scale 0-10; or none, mild, moderate, severe)     Feels twinge . Moderate  4. RECTAL ITCHING: Do you have any itching in this area? How bad is the itching?  (Scale 0-10; or none, mild, moderate, severe)     On occasion not severe 5. CONSTIPATION: Do you have constipation? If Yes, ask: How often do you have a bowel movement (BM)?  (Normal range: 3 times a day to every 3 days)  When was your last BM?       Straining to have BM ,  taking colace and stool soft.  6. CAUSE: What do you think is causing the anus symptoms?     hemorrhoids 7. OTHER SYMPTOMS: Do you have any other symptoms?  (e.g., abdomen pain, fever, rectal bleeding, vomiting)     Rectal pain. Hemorrhoids. Some rectal bleeding at times. Protruding hemorrhoid reported. Pain with sitting 8. PREGNANCY: Is there any chance you are pregnant? When was your last menstrual period?     na  Protocols used: Rectal Symptoms-A-AH

## 2024-06-01 NOTE — Telephone Encounter (Signed)
 Use OTC Anusol  cream, stool softener, lidocaine cream. OV w/any available provider or UC if not well. Thx

## 2024-06-01 NOTE — Telephone Encounter (Signed)
 Patient disconnected during warm transfer.  Copied from CRM [id number]. Topic: Clinical - Red Word Triage >> Jun 01, 2024  8:51 AM Eva FALCON wrote: Red Word that prompted transfer to Nurse Triage: pain with Hemorrhoids, hurts when sitting, also has some bleeding. >> Jun 01, 2024 11:49 AM China J wrote: Patient would like to book an appointment for her hemorrhoids.

## 2024-06-01 NOTE — Telephone Encounter (Signed)
 FYI Only or Action Required?: FYI only for provider: appointment scheduled on 06/02/2024.  Patient was last seen in primary care on 04/10/2024 by Geofm Glade PARAS, MD.  Called Nurse Triage reporting Hemorrhoids.  Symptoms began several weeks ago.  Triage Disposition: See Physician Within 24 Hours  Patient/caregiver understands and will follow disposition?: Yes    Copied from CRM #[id number]. Topic: General - Other >> Jun 01, 2024  3:42 PM Deaijah H wrote: Reason for CRM: Patient called in requesting to speak with Angie with questions for her. Advised currently on call with a patient and will give a call back Reason for Disposition  MODERATE-SEVERE rectal pain (i.e., interferes with school, work, or sleep)  Answer Assessment - Initial Assessment Questions Pt states she has hemorrhoids (pt was triaged earlier today). This RN reviewed nurse triage note from today. Pt scheduled for first available appointment tmrw with PCP in office. This RN educated pt on new-worsening symptoms and when to call back/seek emergent care. Pt verbalized understanding and agrees to plan.  Per note, PCP stated: OV w/any available provider or UC if not well.     Pt had questions about how often she could take Tylenol extra strength and colace. This RN answered all pt questions at this time.  Protocols used: Rectal Symptoms-A-AH

## 2024-06-01 NOTE — Telephone Encounter (Signed)
 Patient had called triage line back and advice from PCP was discussed. Patient expressed understanding

## 2024-06-02 ENCOUNTER — Ambulatory Visit (INDEPENDENT_AMBULATORY_CARE_PROVIDER_SITE_OTHER): Admitting: Internal Medicine

## 2024-06-02 ENCOUNTER — Encounter: Payer: Self-pay | Admitting: Internal Medicine

## 2024-06-02 VITALS — BP 138/86 | HR 69 | Temp 98.0°F | Ht 61.25 in | Wt 137.2 lb

## 2024-06-02 DIAGNOSIS — E538 Deficiency of other specified B group vitamins: Secondary | ICD-10-CM

## 2024-06-02 DIAGNOSIS — K921 Melena: Secondary | ICD-10-CM | POA: Diagnosis not present

## 2024-06-02 DIAGNOSIS — Z85038 Personal history of other malignant neoplasm of large intestine: Secondary | ICD-10-CM

## 2024-06-02 DIAGNOSIS — K629 Disease of anus and rectum, unspecified: Secondary | ICD-10-CM | POA: Diagnosis not present

## 2024-06-02 DIAGNOSIS — E034 Atrophy of thyroid (acquired): Secondary | ICD-10-CM | POA: Diagnosis not present

## 2024-06-02 LAB — CBC WITH DIFFERENTIAL/PLATELET
Basophils Absolute: 0 K/uL (ref 0.0–0.1)
Basophils Relative: 0.4 % (ref 0.0–3.0)
Eosinophils Absolute: 0.2 K/uL (ref 0.0–0.7)
Eosinophils Relative: 1.6 % (ref 0.0–5.0)
HCT: 37.1 % (ref 36.0–46.0)
Hemoglobin: 12.6 g/dL (ref 12.0–15.0)
Lymphocytes Relative: 17.1 % (ref 12.0–46.0)
Lymphs Abs: 1.8 K/uL (ref 0.7–4.0)
MCHC: 33.9 g/dL (ref 30.0–36.0)
MCV: 86.3 fl (ref 78.0–100.0)
Monocytes Absolute: 0.7 K/uL (ref 0.1–1.0)
Monocytes Relative: 6.3 % (ref 3.0–12.0)
Neutro Abs: 7.9 K/uL — ABNORMAL HIGH (ref 1.4–7.7)
Neutrophils Relative %: 74.6 % (ref 43.0–77.0)
Platelets: 355 K/uL (ref 150.0–400.0)
RBC: 4.3 Mil/uL (ref 3.87–5.11)
RDW: 14.8 % (ref 11.5–15.5)
WBC: 10.6 K/uL — ABNORMAL HIGH (ref 4.0–10.5)

## 2024-06-02 LAB — COMPREHENSIVE METABOLIC PANEL WITH GFR
ALT: 25 U/L (ref 0–35)
AST: 28 U/L (ref 0–37)
Albumin: 4.4 g/dL (ref 3.5–5.2)
Alkaline Phosphatase: 88 U/L (ref 39–117)
BUN: 9 mg/dL (ref 6–23)
CO2: 27 meq/L (ref 19–32)
Calcium: 10 mg/dL (ref 8.4–10.5)
Chloride: 105 meq/L (ref 96–112)
Creatinine, Ser: 0.75 mg/dL (ref 0.40–1.20)
GFR: 74.44 mL/min (ref >60.00)
Glucose, Bld: 115 mg/dL — ABNORMAL HIGH (ref 70–99)
Potassium: 4.4 meq/L (ref 3.5–5.1)
Sodium: 141 meq/L (ref 135–145)
Total Bilirubin: 0.4 mg/dL (ref 0.2–1.2)
Total Protein: 7.8 g/dL (ref 6.0–8.3)

## 2024-06-02 LAB — TSH: TSH: 0.81 u[IU]/mL (ref 0.35–5.50)

## 2024-06-02 LAB — T4, FREE: Free T4: 1.01 ng/dL (ref 0.60–1.60)

## 2024-06-02 MED ORDER — HYDROCODONE-ACETAMINOPHEN 5-325 MG PO TABS: 1.0000 | ORAL_TABLET | Freq: Four times a day (QID) | ORAL | 0 refills | Status: DC | PRN

## 2024-06-02 MED ORDER — LIDOCAINE 5 % EX OINT: 1.0000 | TOPICAL_OINTMENT | Freq: Four times a day (QID) | CUTANEOUS | 0 refills | Status: DC | PRN

## 2024-06-02 MED ORDER — CIPROFLOXACIN HCL 500 MG PO TABS: 500.0000 mg | ORAL_TABLET | Freq: Two times a day (BID) | ORAL | 0 refills | Status: DC

## 2024-06-02 MED ORDER — TRIAMCINOLONE ACETONIDE 0.5 % EX OINT: 1.0000 | TOPICAL_OINTMENT | Freq: Four times a day (QID) | CUTANEOUS | 1 refills | Status: DC

## 2024-06-02 NOTE — Assessment & Plan Note (Signed)
 Remote Check CEA

## 2024-06-02 NOTE — Patient Instructions (Signed)
  Surgical consult - Dr Debby  Make sure you have BM every day: Colace  one twice a day; Miralax 1-2 scoops once or twice a day

## 2024-06-02 NOTE — Assessment & Plan Note (Signed)
 Chronic On Levothroid Check FT4

## 2024-06-02 NOTE — Assessment & Plan Note (Addendum)
   Painful large hemorrhoid vs other 2.5x1.5 cm fleshy anal mass between 6 and 12 o'clock, very painful, oozing blood Surgical consult - Dr Debby Triamcinolone  oint QID, Lido cream Cipro  for possible infection CEA, CBC, LFT Make sure you have BM every day: Colace  one twice a day; Miralax 1-2 scoops once or twice a day Norco prn pain

## 2024-06-02 NOTE — Assessment & Plan Note (Signed)
 Due to anal mass Surgery ref Labs w/CBC

## 2024-06-02 NOTE — Progress Notes (Signed)
 Subjective:  Patient ID: Whitney [name], female    DOB: 09-Oct-1940  Age: 83 y.o. MRN: [id number]  CC: Hemorrhoids   HPI Whitney [name] presents for a painful hemorrhoid since Sept 2025, very painful, irritated, bleeding  Outpatient Medications Prior to Visit  Medication Sig Dispense Refill   Accu-Chek FastClix Lancets MISC Use to check blood sugars twice a day as needed 102 each 0   ACCU-CHEK GUIDE TEST test strip USE  STRIP TO CHECK GLUCOSE TWICE DAILY AS NEEDED 100 each 0   Accu-Chek Softclix Lancets lancets USE AS INSTRUCTED TO CHECK BLOOD SUGAR 100 each 3   albuterol (VENTOLIN HFA) 108 (90 Base) MCG/ACT inhaler SMARTSIG:1 Puff(s) Via Inhaler Every 6 Hours PRN     aspirin  81 MG tablet Take 81 mg by mouth daily.     Blood Glucose Monitoring Suppl (ACCU-CHEK GUIDE) w/Device KIT Use to check blood sugars everyday 1 kit 0   cetirizine  (ZYRTEC ) 10 MG tablet TAKE 1 TABLET BY MOUTH EVERY DAY 30 tablet 5   Cholecalciferol (VITAMIN D3) 50 MCG (2000 UT) capsule Take 1 capsule (2,000 Units total) by mouth daily. 100 capsule 3   Cyanocobalamin  (VITAMIN B-12) 1000 MCG SUBL Place 1 tablet (1,000 mcg total) under the tongue daily. 100 tablet 3   Docusate Sodium (COLACE PO) Take by mouth daily before breakfast.     hydrocortisone  2.5 % ointment Apply topically 2 (two) times daily. 30 g 0   ipratropium (ATROVENT ) 0.03 % nasal spray INSTILL 2 SPRAYS INTO EACH NOSTRIL TWICE A DAY 30 mL 5   loratadine  (CLARITIN ) 10 MG tablet Take 1 tablet (10 mg total) by mouth daily. 30 tablet 11   MegaRed Omega-3 Krill Oil 500 MG CAPS Take 1 capsule by mouth every morning. 100 capsule 3   Multiple Vitamins-Minerals (WOMENS MULTI GUMMIES PO) Take by mouth.     Probiotic Product (PROBIOTIC ADVANCED PO) Take by mouth.     rosuvastatin  (CRESTOR ) 5 MG tablet Take 1 tablet by mouth once daily 90 tablet 3   SYNTHROID  125 MCG tablet Take 1 tablet by mouth once daily 90 tablet 2   No facility-administered  medications prior to visit.    ROS: Review of Systems  Constitutional:  Positive for fatigue. Negative for activity change, appetite change, chills and unexpected weight change.  HENT:  Negative for congestion, mouth sores and sinus pressure.   Eyes:  Negative for visual disturbance.  Respiratory:  Negative for cough and chest tightness.   Gastrointestinal:  Positive for anal bleeding, constipation and rectal pain. Negative for abdominal distention, abdominal pain and nausea.  Genitourinary:  Negative for difficulty urinating, frequency and vaginal pain.  Musculoskeletal:  Negative for back pain and gait problem.  Skin:  Negative for pallor and rash.  Neurological:  Negative for dizziness, tremors, weakness, numbness and headaches.  Psychiatric/Behavioral:  Negative for confusion and sleep disturbance.     Objective:  BP 138/86   Pulse 69   Temp 98 F (36.7 C)   Ht 5' 1.25 (1.556 m)   Wt 137 lb 3.2 oz (62.2 kg)   SpO2 92%   BMI 25.71 kg/m   BP Readings from Last 3 Encounters:  06/02/24 138/86  04/10/24 132/78  03/30/24 136/76    Wt Readings from Last 3 Encounters:  06/02/24 137 lb 3.2 oz (62.2 kg)  04/10/24 139 lb (63 kg)  03/30/24 138 lb (62.6 kg)    Physical Exam Constitutional:      General: She is not  in acute distress.    Appearance: Normal appearance. She is well-developed.  HENT:     Head: Normocephalic.     Right Ear: External ear normal.     Left Ear: External ear normal.     Nose: Nose normal.  Eyes:     General:        Right eye: No discharge.        Left eye: No discharge.     Conjunctiva/sclera: Conjunctivae normal.     Pupils: Pupils are equal, round, and reactive to light.  Neck:     Thyroid : No thyromegaly.     Vascular: No JVD.     Trachea: No tracheal deviation.  Cardiovascular:     Rate and Rhythm: Normal rate and regular rhythm.     Heart sounds: Normal heart sounds.  Pulmonary:     Effort: No respiratory distress.     Breath  sounds: No stridor. No wheezing.  Abdominal:     General: Bowel sounds are normal. There is no distension.     Palpations: Abdomen is soft. There is no mass.     Tenderness: There is no abdominal tenderness. There is no guarding or rebound.  Musculoskeletal:        General: No tenderness.     Cervical back: Normal range of motion and neck supple. No rigidity.     Right lower leg: No edema.     Left lower leg: No edema.  Lymphadenopathy:     Cervical: No cervical adenopathy.  Skin:    Findings: No erythema or rash.  Neurological:     Mental Status: She is oriented to person, place, and time.     Cranial Nerves: No cranial nerve deficit.     Motor: No weakness or abnormal muscle tone.     Coordination: Coordination normal.     Deep Tendon Reflexes: Reflexes normal.  Psychiatric:        Behavior: Behavior normal.        Thought Content: Thought content normal.        Judgment: Judgment normal.    Painful large  2.5x1.5 cm fleshy anal mass between 6 and 12 o'clock, very painful, oozing blood  Lab Results  Component Value Date   WBC 8.2 03/20/2024   HGB 12.3 03/20/2024   HCT 37.6 03/20/2024   PLT 324.0 03/20/2024   GLUCOSE 105 (H) 03/20/2024   CHOL 106 03/20/2024   TRIG 88.0 03/20/2024   HDL 40.60 03/20/2024   LDLDIRECT 56.0 12/13/2022   LDLCALC 48 03/20/2024   ALT 15 03/20/2024   AST 16 03/20/2024   NA 138 03/20/2024   K 4.3 03/20/2024   CL 102 03/20/2024   CREATININE 0.75 03/20/2024   BUN 11 03/20/2024   CO2 28 03/20/2024   TSH 0.09 (L) 03/20/2024   HGBA1C 6.7 (H) 03/20/2024    MM 3D SCREENING MAMMOGRAM BILATERAL BREAST Result Date: 02/22/2024 CLINICAL DATA:  Screening. EXAM: DIGITAL SCREENING BILATERAL MAMMOGRAM WITH TOMOSYNTHESIS AND CAD TECHNIQUE: Bilateral screening digital craniocaudal and mediolateral oblique mammograms were obtained. Bilateral screening digital breast tomosynthesis was performed. The images were evaluated with computer-aided detection.  COMPARISON:  Previous exam(s). ACR Breast Density Category a: The breasts are almost entirely fatty. FINDINGS: There are no findings suspicious for malignancy. IMPRESSION: No mammographic evidence of malignancy. A result letter of this screening mammogram will be mailed directly to the patient. RECOMMENDATION: Screening mammogram in one year. (Code:SM-B-01Y) BI-RADS CATEGORY  1: Negative. Electronically Signed   By: Rosina  Vonzell M.D.   On: 02/22/2024 08:10    Assessment & Plan:   Problem List Items Addressed This Visit     Anal disorder - Primary     Painful large hemorrhoid vs other 2.5x1.5 cm fleshy anal mass between 6 and 12 o'clock, very painful, oozing blood Surgical consult - Dr Debby Triamcinolone  oint QID, Lido cream Cipro  for possible infection CEA, CBC, LFT Make sure you have BM every day: Colace  one twice a day; Miralax 1-2 scoops once or twice a day Norco prn pain      Relevant Orders   Ambulatory referral to General Surgery   CEA   CBC with Differential/Platelet   Comprehensive metabolic panel with GFR   B12 deficiency   On B12      Hematochezia   Due to anal mass Surgery ref Labs w/CBC      Relevant Orders   CBC with Differential/Platelet   Comprehensive metabolic panel with GFR   History of malignant neoplasm of large intestine   Remote Check CEA      Relevant Orders   CEA   CBC with Differential/Platelet   Comprehensive metabolic panel with GFR   Hypothyroidism   Chronic On Levothroid Check FT4      Relevant Orders   T4, free   TSH      Meds ordered this encounter  Medications   HYDROcodone -acetaminophen (NORCO/VICODIN) 5-325 MG tablet    Sig: Take 1 tablet by mouth every 6 (six) hours as needed.    Dispense:  20 tablet    Refill:  0   ciprofloxacin  (CIPRO ) 500 MG tablet    Sig: Take 1 tablet (500 mg total) by mouth 2 (two) times daily.    Dispense:  14 tablet    Refill:  0   triamcinolone  ointment (KENALOG ) 0.5 %    Sig: Apply  1 Application topically 4 (four) times daily.    Dispense:  120 g    Refill:  1   lidocaine (XYLOCAINE) 5 % ointment    Sig: Apply 1 Application topically 4 (four) times daily as needed for moderate pain (pain score 4-6).    Dispense:  50 g    Refill:  0      Follow-up: Return in about 4 weeks (around 06/30/2024) for a follow-up visit.  Marolyn Noel, MD

## 2024-06-02 NOTE — Assessment & Plan Note (Signed)
 On B12

## 2024-06-03 ENCOUNTER — Ambulatory Visit: Payer: Self-pay | Admitting: Internal Medicine

## 2024-06-03 LAB — CEA: CEA: 2 ng/mL

## 2024-06-05 ENCOUNTER — Telehealth: Payer: Self-pay

## 2024-06-05 NOTE — Telephone Encounter (Signed)
 Pt called back and asked for somebody to contact her at least by EOD.

## 2024-06-05 NOTE — Telephone Encounter (Signed)
 Called and spoke with patient. She explained that this was in the regards to the miralax and issues with constipation. Patient noted that at this point she has had all of 4 doses of the miralax to no avail. Patient is wanting to know if there would be something else you would recommend or if she could go back to the benefiber?  Is there a certain time of the day that she could take it that it would work better?

## 2024-06-05 NOTE — Telephone Encounter (Signed)
 Copied from CRM #[id number]. Topic: Clinical - Medication Question >> Jun 05, 2024  9:57 AM Revonda D wrote: Reason for CRM: Pt is requesting to speak with Dr.Plotnikov or his nurse in regards to a medication and would like a callback today if possible.

## 2024-06-08 ENCOUNTER — Ambulatory Visit: Admitting: Family Medicine

## 2024-06-08 ENCOUNTER — Encounter: Payer: Self-pay | Admitting: Family Medicine

## 2024-06-08 ENCOUNTER — Ambulatory Visit: Payer: Self-pay

## 2024-06-08 VITALS — BP 152/84 | HR 68 | Temp 98.0°F | Resp 18 | Ht 61.25 in | Wt 138.0 lb

## 2024-06-08 DIAGNOSIS — K5909 Other constipation: Secondary | ICD-10-CM | POA: Diagnosis not present

## 2024-06-08 DIAGNOSIS — K645 Perianal venous thrombosis: Secondary | ICD-10-CM

## 2024-06-08 MED ORDER — LINACLOTIDE 72 MCG PO CAPS: 72.0000 ug | ORAL_CAPSULE | Freq: Every day | ORAL | 2 refills | Status: DC

## 2024-06-08 MED ORDER — LIDOCAINE HCL URETHRAL/MUCOSAL 2 % EX PRSY: 1.0000 | PREFILLED_SYRINGE | CUTANEOUS | 0 refills | Status: DC | PRN

## 2024-06-08 NOTE — Assessment & Plan Note (Signed)
 Chronic constipation with hard stools despite Miralax, Colace, Benefiber, and Dulcolax. Painful bowel movements and irregularity. Discussed timing of laxative use and impact on activities. Prescription medication prescribed to be taken once daily before breakfast, not expected to cause urgency. - Prescribed daily Linzess for constipation before breakfast. - Continue Miralax and Colace as needed, though prescription medication may reduce need. - Education provided on constipation.  Orders:   linaclotide (LINZESS) 72 MCG capsule; Take 1 capsule (72 mcg total) by mouth daily before breakfast.

## 2024-06-08 NOTE — Progress Notes (Signed)
 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 Previous use of triamcinolone . Lidocaine expensive. Referral to specialist scheduled. Discussed potential hemorrhoid banding. Insurance coverage for alternative numbing jelly identified. - Prescribed numbing jelly preferred by insurance to use with triamcinolone  for pain management. - Follow up with specialist on December 2nd for further evaluation and management.  - Education provided on hemorrhoids. Orders:   lidocaine (XYLOCAINE) 2 % jelly; Apply 1 Application topically as needed.  Other constipation Chronic constipation with hard stools despite Miralax, Colace, Benefiber, and Dulcolax. Painful bowel movements and irregularity. Discussed timing of laxative use and impact on activities. Prescription medication prescribed to be taken once daily before breakfast, not expected to cause urgency. - Prescribed daily Linzess for constipation before breakfast. - Continue Miralax and Colace as needed, though prescription medication may reduce need. - Education provided on constipation.  Orders:   linaclotide (LINZESS) 72 MCG capsule; Take 1 capsule (72 mcg total) by mouth daily before breakfast.    Follow up plan: Return if symptoms worsen or fail to improve.  Whitney Rung, MSN, APRN, FNP-C  Subjective:  HPI: Whitney [name] is a 83 y.o. female presenting on 06/08/2024 for Constipation (She has a hemorrhoid and has also been using Miralax OTC and some Colace. Whitney [name] dealing with this for several days. /)  Discussed the use of AI scribe software for clinical note transcription with the patient, who gave verbal consent to proceed.  She has been experiencing ongoing issues with hemorrhoids and constipation. Despite using Miralax and Colace as recommended, her bowel movements consist of small, hard stools, causing significant pain. She uses warm baths to alleviate discomfort. The  hemorrhoids are very painful and unsightly, causing discomfort even with minimal bowel movements. She has been using triamcinolone  ointment as prescribed but has not purchased lidocaine due to its high cost.  She is scheduled to see a general surgeon on December 2nd for further evaluation of her hemorrhoids. Her current regimen includes one capful of Miralax, sometimes increased to two, though she has not found additional benefit from the higher dose. She also takes two to three Colace tablets to soften stools. She has previously used Benafiber and Dulcolax, with better results from [place], but has not used Dulcolax recently as she is under medical supervision.  She is concerned about the timing of laxative use due to her active lifestyle, as taking them in the morning can leave her housebound. She inquires about the best time to take laxatives to avoid interference with her daily activities.  She has a history of colon cancer diagnosed in 2008, for which she did not require chemotherapy or radiation. She reports that the pain from her current condition is more severe than what she experienced during her cancer treatment.      ROS: Negative unless specifically indicated above in HPI.   Relevant past medical history reviewed and updated as indicated.   Allergies and medications reviewed and updated.   Current Outpatient Medications:    Accu-Chek FastClix Lancets MISC, Use to check blood sugars twice a day as needed, Disp: 102 each, Rfl: 0   ACCU-CHEK GUIDE TEST test strip, USE  STRIP TO CHECK GLUCOSE TWICE DAILY AS NEEDED, Disp: 100 each, Rfl: 0   Accu-Chek Softclix Lancets lancets, USE AS INSTRUCTED TO CHECK BLOOD SUGAR, Disp: 100 each, Rfl: 3   aspirin  81 MG tablet, Take 81 mg by mouth daily., Disp: , Rfl:    Blood Glucose Monitoring Suppl (ACCU-CHEK GUIDE)  w/Device KIT, Use to check blood sugars everyday, Disp: 1 kit, Rfl: 0   Cholecalciferol (VITAMIN D3) 50 MCG (2000 UT) capsule, Take 1  capsule (2,000 Units total) by mouth daily., Disp: 100 capsule, Rfl: 3   ciprofloxacin  (CIPRO ) 500 MG tablet, Take 1 tablet (500 mg total) by mouth 2 (two) times daily., Disp: 14 tablet, Rfl: 0   Cyanocobalamin  (VITAMIN B-12) 1000 MCG SUBL, Place 1 tablet (1,000 mcg total) under the tongue daily., Disp: 100 tablet, Rfl: 3   Docusate Sodium (COLACE PO), Take by mouth daily before breakfast., Disp: , Rfl:    HYDROcodone -acetaminophen (NORCO/VICODIN) 5-325 MG tablet, Take 1 tablet by mouth every 6 (six) hours as needed., Disp: 20 tablet, Rfl: 0   hydrocortisone  2.5 % ointment, Apply topically 2 (two) times daily., Disp: 30 g, Rfl: 0   lidocaine (XYLOCAINE) 2 % jelly, Apply 1 Application topically as needed., Disp: 20 mL, Rfl: 0   linaclotide (LINZESS) 72 MCG capsule, Take 1 capsule (72 mcg total) by mouth daily before breakfast., Disp: 30 capsule, Rfl: 2   MegaRed Omega-3 Krill Oil 500 MG CAPS, Take 1 capsule by mouth every morning., Disp: 100 capsule, Rfl: 3   Multiple Vitamins-Minerals (WOMENS MULTI GUMMIES PO), Take by mouth., Disp: , Rfl:    rosuvastatin  (CRESTOR ) 5 MG tablet, Take 1 tablet by mouth once daily, Disp: 90 tablet, Rfl: 3   SYNTHROID  125 MCG tablet, Take 1 tablet by mouth once daily, Disp: 90 tablet, Rfl: 2   triamcinolone  ointment (KENALOG ) 0.5 %, Apply 1 Application topically 4 (four) times daily., Disp: 120 g, Rfl: 1  Allergies  Allergen Reactions   Benzocaine-Menthol Other (See Comments)    Closes pt's throat up. Problems breathing   Codeine Phosphate    Penicillins     Objective:   BP (!) 152/84   Pulse 68   Temp 98 F (36.7 C)   Resp 18   Ht 5' 1.25 (1.556 m)   Wt 138 lb (62.6 kg)   SpO2 92%   BMI 25.86 kg/m    Physical Exam Vitals reviewed.  Constitutional:      General: She is not in acute distress.    Appearance: Normal appearance. She is not ill-appearing, toxic-appearing or diaphoretic.  HENT:     Head: Normocephalic and atraumatic.  Eyes:      General: No scleral icterus.       Right eye: No discharge.        Left eye: No discharge.     Conjunctiva/sclera: Conjunctivae normal.  Cardiovascular:     Rate and Rhythm: Normal rate.  Pulmonary:     Effort: Pulmonary effort is normal. No respiratory distress.  Musculoskeletal:        General: Normal range of motion.     Cervical back: Normal range of motion.  Skin:    General: Skin is warm and dry.     Capillary Refill: Capillary refill takes less than 2 seconds.  Neurological:     General: No focal deficit present.     Mental Status: She is alert and oriented to person, place, and time. Mental status is at baseline.  Psychiatric:        Mood and Affect: Mood normal.        Behavior: Behavior normal.        Thought Content: Thought content normal.        Judgment: Judgment normal.

## 2024-06-08 NOTE — Telephone Encounter (Signed)
 FYI Only or Action Required?: Action required by provider: clinical question for provider and update on patient condition.  Patient was last seen in primary care on 06/02/2024 by Plotnikov, Karlynn GAILS, MD.  Called Nurse Triage reporting Constipation.  Symptoms began a week ago.  Interventions attempted: OTC medications: Miralax.  Symptoms are: gradually worsening.  Triage Disposition: See PCP When Office is Open (Within 3 Days)  Patient/caregiver understands and will follow disposition?: Yes

## 2024-06-08 NOTE — Telephone Encounter (Signed)
 Take MiraLAX 2 scoops in water twice a day and with bisacodyl laxative one-two tablets twice a day  Thanks

## 2024-06-08 NOTE — Telephone Encounter (Signed)
 Call disconnected before warm transfer completed.  Will attempt to contact the patient at this time.    Copied from CRM #[id number]. Topic: Clinical - Red Word Triage >> Jun 08, 2024  8:01 AM Whitney [name] wrote: Red Word that prompted transfer to Nurse Triage: extreme constipation, pain in her buttock area due to straining, a little blood present when attempting to pass stool. Has questions about laxatives and when is it appropriate to take them. Initial requested a call back from provider nurse. Reason for Disposition . Unable to have a bowel movement (BM) without laxative or enema  Answer Assessment - Initial Assessment Questions Patient confirms she has made an appointment with the specialist on Tuesday, December the 2nd as previously discussed with [name].  Wanting an alternative solution as Miralax is not working for her at this time. Wondering if she should switch back to the Benefiber or would you like for her to be seen?   1. STOOL PATTERN OR FREQUENCY: How often do you have a bowel movement (BM)?  (Normal range: 3 times a day to every 3 days)  When was your last BM?        Almost everyday  2. STRAINING: Do you have to strain to have a BM?      Yes  3. ONSET: When did the constipation begin?     A week ago, since seeing [name]  4. RECTAL PAIN: Does your rectum hurt when the stool comes out? If Yes, ask: Do you have hemorrhoids? How bad is the pain?  (Scale 1-10; or mild, moderate, severe)     Yes, Patient has hemorrhoids.   5. BM COMPOSITION: Are the stools hard?      Yes  6. BLOOD ON STOOLS: Has there been any blood on the toilet tissue or on the surface of the BM? If Yes, ask: When was the last time?     Yes  7. CHRONIC CONSTIPATION: Is this a new problem for you?  If No, ask: How long have you had this problem? (days, weeks, months)      No  8. CHANGES IN DIET OR HYDRATION: Have there been any recent changes in your diet? How much  fluids are you drinking on a daily basis?  How much have you had to drink today?     No  9. MEDICINES: Have you been taking any new medicines? Are you taking any narcotic pain medicines? (e.g., Dilaudid, morphine, Percocet, Vicodin)     No  10. LAXATIVES: Have you been using any stool softeners, laxatives, or enemas?  If Yes, ask What are you using, how often, and when was the last time?       Yes, Miralax  11. ACTIVITY:  How much walking do you do every day?  Has your activity level decreased in the past week?         Pretty active.  12. CAUSE: What do you think is causing the constipation?        Unsure  13. MEDICAL HISTORY: Do you have a history of hemorrhoids, rectal fissures, rectal surgery, or rectal abscess?         Hemorrhoids  14. OTHER SYMPTOMS: Do you have any other symptoms? (e.g., abdomen pain, bloating, fever, vomiting)       Bloating  15. PREGNANCY: Is there any chance you are pregnant? When was your last menstrual period?       No and No  Protocols used: Constipation-A-AH

## 2024-06-09 NOTE — Telephone Encounter (Signed)
 Noted! Thank you

## 2024-06-12 ENCOUNTER — Other Ambulatory Visit: Payer: Self-pay | Admitting: Internal Medicine

## 2024-06-18 ENCOUNTER — Other Ambulatory Visit: Payer: Self-pay | Admitting: Internal Medicine

## 2024-06-22 ENCOUNTER — Other Ambulatory Visit: Payer: Self-pay | Admitting: General Surgery

## 2024-06-23 DIAGNOSIS — C44520 Squamous cell carcinoma of anal skin: Secondary | ICD-10-CM | POA: Diagnosis not present

## 2024-06-23 NOTE — Progress Notes (Signed)
 REFERRING PHYSICIAN:  Plotnikov, Karlynn GAILS, MD  PROVIDER:  BERNARDA WANDA NED, MD  MRN: [id number] DOB: [birth year] DATE OF ENCOUNTER: 06/23/2024  Subjective   Chief Complaint: New Consultation     History of Present Illness: Whitney [name] is a 83 y.o. female who is seen today as an office consultation at the request of [name] for evaluation of New Consultation .  83 year old female with chronic constipation and hard stools despite using MiraLAX, Colace, Benefiber and Dulcolax.  Recently given a prescription for Linzess .  She has a chronically inflamed external hemorrhoid that is causing pain.  She reports episodes of pain for the last few weeks.  She is having better bowel function with the Linzess .  Despite this, she is having worsening pain.   Review of Systems: A complete review of systems was obtained from the patient.  I have reviewed this information and discussed as appropriate with the patient.  See HPI as well for other ROS.   Medical History: Past Medical History:  Diagnosis Date  . Thyroid  disease     There is no problem list on file for this patient.   Past Surgical History:  Procedure Laterality Date  . CHOLECYSTECTOMY    . COLON SURGERY    . MASTECTOMY PARTIAL / LUMPECTOMY       Allergies  Allergen Reactions  . Codeine Anaphylaxis    Childhood. Pt doesn't remember  . Penicillins Anaphylaxis and Other (See Comments)    Childhood. Pt doesn't remember  . Benzocaine-Menthol Other (See Comments)    Closes pt's throat up. Problems breathing    Current Outpatient Medications on File Prior to Visit  Medication Sig Dispense Refill  . aspirin  81 mg tablet Take 81 mg by mouth once daily    . cyanocobalamin  (VITAMIN B12) 1000 MCG tablet Take 1,000 mcg by mouth once daily    . HYDROcodone -acetaminophen  (NORCO) 5-325 mg tablet Take 1 tablet by mouth every 6 (six) hours as needed    . hydrocortisone  2.5 % ointment Apply topically 2 (two) times  daily    . krill-om-3-dha-epa-phospho-ast (MEGARED OMEGA-3 KRILL OIL) 500-115-30-64 mg Cap Take 1 capsule by mouth every morning    . lidocaine  (UROJET/GLYDO) 2 % jelly in applicator Apply topically as needed    . LINZESS  72 mcg capsule Take 72 mcg by mouth every morning before breakfast    . rosuvastatin  (CRESTOR ) 5 MG tablet Take 5 mg by mouth once daily    . SYNTHROID  125 mcg tablet Take 125 mcg by mouth once daily    . triamcinolone  0.5 % ointment Apply 1 Application topically     No current facility-administered medications on file prior to visit.    Family History  Problem Relation Age of Onset  . Coronary Artery Disease (Blocked arteries around heart) Mother   . Coronary Artery Disease (Blocked arteries around heart) Father      Social History   Tobacco Use  Smoking Status Never  Smokeless Tobacco Never     Social History   Socioeconomic History  . Marital status: Widowed  Tobacco Use  . Smoking status: Never  . Smokeless tobacco: Never  Substance and Sexual Activity  . Alcohol use: Never  . Drug use: Never   Social Drivers of Corporate Investment Banker Strain: Low Risk  (03/27/2024)   Received from [facility]   Overall Financial Resource Strain (CARDIA)   . How hard is it for you to pay for the very basics like  food, housing, medical care, and heating?: Not hard at all  Food Insecurity: No Food Insecurity (03/27/2024)   Received from [facility]   Hunger Vital Sign   . Within the past 12 months, you worried that your food would run out before you got the money to buy more.: Never true   . Within the past 12 months, the food you bought just didn't last and you didn't have money to get more.: Never true  Transportation Needs: No Transportation Needs (03/27/2024)   Received from [facility] - Transportation   . In the past 12 months, has lack of transportation kept you from medical appointments or from getting medications?: No   . In the past 12 months,  has lack of transportation kept you from meetings, work, or from getting things needed for daily living?: No  Physical Activity: Inactive (03/27/2024)   Received from [facility]   Exercise Vital Sign   . On average, how many days per week do you engage in moderate to strenuous exercise (like a brisk walk)?: 0 days   . On average, how many minutes do you engage in exercise at this level?: 0 min  Stress: No Stress Concern Present (03/27/2024)   Received from [facility] - Occupational Stress Questionnaire   . Do you feel stress - tense, restless, nervous, or anxious, or unable to sleep at night because your mind is troubled all the time - these days?: Not at all  Social Connections: Moderately Integrated (03/27/2024)   Received from [facility]   Social Connection and Isolation Panel   . In a typical week, how many times do you talk on the phone with family, friends, or neighbors?: More than three times a week   . How often do you get together with friends or relatives?: More than three times a week   . How often do you attend church or religious services?: More than 4 times per year   . Do you belong to any clubs or organizations such as church groups, unions, fraternal or athletic groups, or school groups?: Yes   . How often do you attend meetings of the clubs or organizations you belong to?: More than 4 times per year   . Are you married, widowed, divorced, separated, never married, or living with a partner?: Widowed  Housing Stability: Unknown (06/23/2024)   Housing Stability Vital Sign   . Homeless in the Last Year: No    Objective:    Vitals:   06/23/24 1043 06/23/24 1044  Pulse: (!) 120   Temp: 36.8 C (98.2 F)   SpO2: 99%   Weight: 61.8 kg (136 lb 3.2 oz)   Height: 155.4 cm (5' 1.2)   PainSc:    9  PainLoc:  Rectum     Exam Gen: NAD Abd: soft Rectal: Left lateral firm, fixed mass concerning for malignancy.   Labs, Imaging and  Diagnostic Testing:  Procedure: Anal biopsy Surgeon: Debby After the risks and benefits were explained, written consent was obtained for above procedure.  A medical assistant chaperone was present thoroughout the entire procedure. Examination chaperoned by Burnard Crete, LPN. Anesthesia: none Diagnosis: anal mass Description: Area was cleaned with ChloraPrep.  5 mL of subcutaneous lidocaine  was injected into the mass.  Once anesthesia was established an endoscopic biopsy forcep was used to take several biopsies of the mass.  These were then sent to pathology for further examination.  Hemostasis was  achieved using direct pressure.  Assessment and Plan:  Mass of anus  (primary encounter diagnosis)  83 year old female with painful, fixed anal mass.  This is concerning for malignancy.  Biopsy was taken under local anesthesia in the office today.  We will follow-up with the results as soon as possible.  Bernarda JAYSON Ned, MD Colon and Rectal Surgery [facility]

## 2024-06-24 ENCOUNTER — Ambulatory Visit: Payer: Self-pay

## 2024-06-24 ENCOUNTER — Telehealth: Payer: Self-pay | Admitting: Internal Medicine

## 2024-06-24 DIAGNOSIS — K629 Disease of anus and rectum, unspecified: Secondary | ICD-10-CM

## 2024-06-24 LAB — DERMATOLOGY PATHOLOGY

## 2024-06-24 NOTE — Telephone Encounter (Signed)
 FYI Only or Action Required?: Action required by provider: medication refill request.  Patient was last seen in primary care on 06/08/2024 by Merlynn Niki FALCON, FNP.  Called Nurse Triage reporting Rectal Pain and Medication Refill.  Symptoms began several weeks ago.  Interventions attempted: OTC medications: tylenol  and Prescription medications: hydrocodone - did educate on how much tylenol  she can take a day.  Symptoms are: unchanged.  Triage Disposition: See HCP Within 4 Hours (Or PCP Triage)  Patient/caregiver understands and will follow disposition?: No, wishes to speak with PCP       Copied from CRM #[id number]. Topic: Clinical - Red Word Triage >> Jun 24, 2024 12:09 PM Sophia H wrote: Red Word that prompted transfer to Nurse Triage: Patient states she had a biopsy done and was told PCP would need to refill pain meds as the surgeon she saw yesterday was unable to. Please advise patient, states she is having severe pain from biopsy and needs fill on meds. NT   HYDROcodone -acetaminophen  (NORCO/VICODIN) 5-325 MG tablet [facility] - [place], [place] - [address] Reason for Disposition  SEVERE rectal pain (e.g., excruciating, unable to have a bowel movement)  Answer Assessment - Initial Assessment Questions 1. DRUG NAME: What medicine do you need to have refilled?     hydrocodone  2. REFILLS REMAINING: How many refills are remaining? Notes: The label on the medicine or pill bottle will show how many refills are remaining. If there are no refills remaining, then a renewal may be needed.     none 4. PRESCRIBER: Who prescribed it? Note: The prescribing doctor or group is responsible for refill approvals..     [name] 5. PHARMACY: Have you contacted your pharmacy (drugstore)? Note: Some pharmacies will contact the doctor (or NP/PA).      [employer] 6. SYMPTOMS: Do you have any symptoms?     Pt has seen [name] for this rectal pain. She did  have an appt yesterday and a biopsy was taken. She states she is still having significant pain. She says even to expel gas causes pain. Pain is 10/10. Denies any other symptoms. States she can't sit due to the pain so she lays down mostly. States she has been taking it about every 6 hours and was told she can occasionally take 2.  Answer Assessment - Initial Assessment Questions 1. SYMPTOM:  What's the main symptom you're concerned about? (e.g., pain, itching, swelling, rash)     Pain. Has hemorroids, biopsy done yesterday 2. ONSET: When did the pain  start?     Pt saw [name] for this about a month ago, just needs refill of pain meds 3. RECTAL PAIN: Do you have any pain around your rectum? How bad is the pain?  (Scale 0-10; or none, mild, moderate, severe)     10/10  Protocols used: Medication Refill and Renewal Call-A-AH, Rectal Symptoms-A-AH

## 2024-06-24 NOTE — Telephone Encounter (Unsigned)
 Copied from CRM [id number]. Topic: Clinical - Medication Refill >> Jun 24, 2024 12:39 PM Alfonso HERO wrote: Medication: HYDROcodone -acetaminophen  (NORCO/VICODIN) 5-325 MG tablet  Has the patient contacted their pharmacy? Yes (Agent: If no, request that the patient contact the pharmacy for the refill. If patient does not wish to contact the pharmacy document the reason why and proceed with request.) (Agent: If yes, when and what did the pharmacy advise?)  This is the patient's preferred pharmacy:  [facility] [address] [place] - [address] [place] [address] Phone: [phone] Fax: [phone]   Is this the correct pharmacy for this prescription? Yes If no, delete pharmacy and type the correct one.   Has the prescription been filled recently? Yes  Is the patient out of the medication? Yes  Has the patient been seen for an appointment in the last year OR does the patient have an upcoming appointment? Yes  Can we respond through MyChart? Yes  Agent: Please be advised that Rx refills may take up to 3 business days. We ask that you follow-up with your pharmacy.

## 2024-06-25 ENCOUNTER — Other Ambulatory Visit: Payer: Self-pay

## 2024-06-25 ENCOUNTER — Ambulatory Visit: Payer: Self-pay

## 2024-06-25 DIAGNOSIS — K629 Disease of anus and rectum, unspecified: Secondary | ICD-10-CM

## 2024-06-25 DIAGNOSIS — D485 Neoplasm of uncertain behavior of skin: Secondary | ICD-10-CM

## 2024-06-25 NOTE — Telephone Encounter (Signed)
 FYI Only or Action Required?: Action required by provider: medication refill request and update on patient condition.  Patient was last seen in primary care on 06/08/2024 by Merlynn Niki FALCON, FNP.  Called Nurse Triage reporting Rectal Pain.  Triage Disposition: Call PCP When Office is Open  Patient/caregiver understands and will follow disposition?: Yes  Copied from CRM #[id number]. Topic: Clinical - Red Word Triage >> Jun 25, 2024  3:52 PM Macario HERO wrote: Red Word that prompted transfer to Nurse Triage: Patient said she is in severe pain and requesting to speak with nurse. Reason for Disposition  Caller requesting a CONTROLLED substance prescription refill (e.g., narcotics, ADHD medicines)  Answer Assessment - Initial Assessment Questions 1. NAME of MEDICINE: What medicine(s) are you calling about?     Pt called in to f/u on refill of HYDROcodone -acetaminophen  (NORCO/VICODIN) 5-325 MG tablet that she requested yesterday be sent to [facility]. She states she did not realize she did not have any refills on medication so she is completely out. She questioned if she could take Tylenol  severe q6h until medication was sent in. Confirmed with her okay to take OTC medications and that I would send refill request f/u message to provider. She voiced appreciation.  Protocols used: Hospice - Medication Question or Refill Call-A-AH, Medication Refill and Renewal Call-A-AH

## 2024-06-25 NOTE — Telephone Encounter (Signed)
 Requesting refill of Norco medication

## 2024-06-25 NOTE — Telephone Encounter (Signed)
 Patient/caller refused triage.  Reason for refusal: med refill inquiry, chronic issue, already evaluated.  FYI Only or Action Required?: Action required by provider: medication refill request. Patient requests urgent refill. See refill encounter dated 06/24/24  Patient was last seen in primary care on 06/08/2024 by Merlynn Niki FALCON, FNP.  Called Nurse Triage reporting Mass and Medication Refill.  Symptoms began refused triage.  Interventions attempted: Other: refused triage.  Symptoms are: refused triage.  Triage Disposition: Call PCP When Office is Open  Patient/caregiver understands and will follow disposition?: No, wishes to speak with PCP   Copied from CRM #[id number]. Topic: Clinical - Red Word Triage >> Jun 25, 2024  8:25 AM Whitney [name] wrote: Red Word that prompted transfer to Nurse Triage: Pt reports  pain on her bottom and states she has a growth in that area. Pt has a pending refill request for hydrocodone .She reports she has only half a pill left Reason for Disposition  Caller requesting a CONTROLLED substance prescription refill (e.g., narcotics, ADHD medicines)  Answer Assessment - Initial Assessment Questions Additional info: Patient calling in to inquire on Norco refill she requested yesterday, aware of 3 day TAT but patient states this is urgent that she has a tumor on her bottom and needs pain relief, she has only 1/2 tablet left. Asking for urgent refill to [employer] See refill encounter 06/24/24    1. DRUG NAME: What medicine do you need to have refilled?     Hydrocodone  2. REFILLS REMAINING: How many refills are remaining? Notes: The label on the medicine or pill bottle will show how many refills are remaining. If there are no refills remaining, then a renewal may be needed.      3. EXPIRATION DATE: What is the expiration date? Note: The label states when the prescription will expire, and thus can no longer be refilled.)      4. PRESCRIBER: Who  prescribed it? Note: The prescribing doctor or group is responsible for refill approvals..      5. PHARMACY: Have you contacted your pharmacy (drugstore)? Note: Some pharmacies will contact the doctor (or NP/PA).      [employer] 6. SYMPTOMS: Do you have any symptoms?     Tumor on bottom, recent biopsy.  Protocols used: Medication Refill and Renewal Call-A-AH

## 2024-06-26 ENCOUNTER — Telehealth: Payer: Self-pay | Admitting: Internal Medicine

## 2024-06-26 ENCOUNTER — Ambulatory Visit: Payer: Self-pay

## 2024-06-26 ENCOUNTER — Encounter: Payer: Self-pay | Admitting: *Deleted

## 2024-06-26 NOTE — Telephone Encounter (Signed)
 This RN made attempt to contact pt with no answer. A voicemail was left with call back number provided.        Copied from CRM [id number]. Topic: Clinical - Medication Refill >> Jun 26, 2024  2:41 PM Jasmin G wrote: Medication: hydrocortisone  2.5 % ointment. Pt requested for prescription to be placed today if possible as she states that she's in a lot of pain.   Has the patient contacted their pharmacy? Yes (Agent: If no, request that the patient contact the pharmacy for the refill. If patient does not wish to contact the pharmacy document the reason why and proceed with request.) (Agent: If yes, when and what did the pharmacy advise?)   This is the patient's preferred pharmacy:  [facility] [address], [place] - [address] [place] [address] Phone: [phone] Fax: [phone]   Is this the correct pharmacy for this prescription? Yes If no, delete pharmacy and type the correct one.    Has the prescription been filled recently? Yes   Is the patient out of the medication? Yes   Has the patient been seen for an appointment in the last year OR does the patient have an upcoming appointment? Yes   Can we respond through MyChart? No   Agent: Please be advised that Rx refills may take up to 3 business days. We ask that you follow-up with your pharmacy.

## 2024-06-26 NOTE — Progress Notes (Signed)
 PATIENT NAVIGATOR PROGRESS NOTE  Name: KELITA WALLIS Date: 06/26/2024 MRN: [id number]  DOB: 09-Jan-1941   Reason for visit:  Introductory phone call  Comments:  Called and spoke with pt and have scheduled her with Olam Ned NP on 12/11 at 2:15. Directions to building and parking reviewed as well as contact number to call with questions      Time spent counseling/coordinating care: 30-45 minutes

## 2024-06-26 NOTE — Telephone Encounter (Signed)
 Copied from CRM [id number]. Topic: Clinical - Red Word Triage >> Jun 26, 2024 10:32 AM Jasmin G wrote: Red Word that prompted transfer to Nurse Triage: Pt requested to speak to NT regarding her pain med not working, pt states that she has been in a lot of pain since last night, could not sleep due to it.This encounter was created in error - please disregard.

## 2024-06-26 NOTE — Telephone Encounter (Signed)
 Patient called in stating no follow up questions or changes to symptoms. She states she is calling in to ask about the status of the refill for hydrocodone . No updates at this time. Patient has spoken this morning with CMA Hadassah; message regarding refill was sent HP to Dr Garald. Patient aware and states she is going to the take the Tylenol  now and will await any updates on medication refill request.  Copied from CRM #[id number]. Topic: Clinical - Red Word Triage >> Jun 26, 2024 10:32 AM Jasmin G wrote: Red Word that prompted transfer to Nurse Triage: Pt requested to speak to NT regarding her pain med not working, pt states that she has been in a lot of pain since last night, could not sleep due to it.

## 2024-06-26 NOTE — Telephone Encounter (Unsigned)
 Copied from CRM [id number]. Topic: Clinical - Medication Refill >> Jun 26, 2024  2:41 PM Jasmin G wrote: Medication: hydrocortisone  2.5 % ointment. Pt requested for prescription to be placed today if possible as she states that she's in a lot of pain.  Has the patient contacted their pharmacy? Yes (Agent: If no, request that the patient contact the pharmacy for the refill. If patient does not wish to contact the pharmacy document the reason why and proceed with request.) (Agent: If yes, when and what did the pharmacy advise?)  This is the patient's preferred pharmacy:  [facility] [address], [place] - [address] [place] [address] Phone: [phone] Fax: [phone]  Is this the correct pharmacy for this prescription? Yes If no, delete pharmacy and type the correct one.   Has the prescription been filled recently? Yes  Is the patient out of the medication? Yes  Has the patient been seen for an appointment in the last year OR does the patient have an upcoming appointment? Yes  Can we respond through MyChart? No  Agent: Please be advised that Rx refills may take up to 3 business days. We ask that you follow-up with your pharmacy.

## 2024-06-28 ENCOUNTER — Other Ambulatory Visit: Payer: Self-pay

## 2024-06-28 ENCOUNTER — Emergency Department (HOSPITAL_COMMUNITY)
Admission: EM | Admit: 2024-06-28 | Discharge: 2024-06-28 | Disposition: A | Discharge status: Home / Self Care | Attending: Emergency Medicine | Admitting: Emergency Medicine

## 2024-06-28 ENCOUNTER — Emergency Department (HOSPITAL_COMMUNITY)

## 2024-06-28 ENCOUNTER — Encounter (HOSPITAL_COMMUNITY): Payer: Self-pay

## 2024-06-28 DIAGNOSIS — Z7982 Long term (current) use of aspirin: Secondary | ICD-10-CM | POA: Insufficient documentation

## 2024-06-28 DIAGNOSIS — K5792 Diverticulitis of intestine, part unspecified, without perforation or abscess without bleeding: Secondary | ICD-10-CM | POA: Diagnosis not present

## 2024-06-28 DIAGNOSIS — E119 Type 2 diabetes mellitus without complications: Secondary | ICD-10-CM | POA: Insufficient documentation

## 2024-06-28 DIAGNOSIS — Z9049 Acquired absence of other specified parts of digestive tract: Secondary | ICD-10-CM | POA: Diagnosis not present

## 2024-06-28 DIAGNOSIS — E039 Hypothyroidism, unspecified: Secondary | ICD-10-CM | POA: Insufficient documentation

## 2024-06-28 DIAGNOSIS — C210 Malignant neoplasm of anus, unspecified: Secondary | ICD-10-CM

## 2024-06-28 DIAGNOSIS — C44500 Unspecified malignant neoplasm of anal skin: Secondary | ICD-10-CM | POA: Diagnosis not present

## 2024-06-28 DIAGNOSIS — K625 Hemorrhage of anus and rectum: Secondary | ICD-10-CM | POA: Diagnosis not present

## 2024-06-28 LAB — CBC
HCT: 38.2 % (ref 36.0–46.0)
Hemoglobin: 12.4 g/dL (ref 12.0–15.0)
MCH: 28.6 pg (ref 26.0–34.0)
MCHC: 32.5 g/dL (ref 30.0–36.0)
MCV: 88 fL (ref 80.0–100.0)
Platelets: 397 K/uL (ref 150–400)
RBC: 4.34 MIL/uL (ref 3.87–5.11)
RDW: 13.8 % (ref 11.5–15.5)
WBC: 13.4 K/uL — ABNORMAL HIGH (ref 4.0–10.5)
nRBC: 0 % (ref 0.0–0.2)

## 2024-06-28 LAB — COMPREHENSIVE METABOLIC PANEL WITH GFR
ALT: 70 U/L — ABNORMAL HIGH (ref 0–44)
AST: 69 U/L — ABNORMAL HIGH (ref 15–41)
Albumin: 4.1 g/dL (ref 3.5–5.0)
Alkaline Phosphatase: 116 U/L (ref 38–126)
Anion gap: 13 (ref 5–15)
BUN: 12 mg/dL (ref 8–23)
CO2: 28 mmol/L (ref 22–32)
Calcium: 10.1 mg/dL (ref 8.9–10.3)
Chloride: 99 mmol/L (ref 98–111)
Creatinine, Ser: 0.72 mg/dL (ref 0.44–1.00)
GFR, Estimated: >60 mL/min (ref >60)
Glucose, Bld: 150 mg/dL — ABNORMAL HIGH (ref 70–99)
Potassium: 2.9 mmol/L — ABNORMAL LOW (ref 3.5–5.1)
Sodium: 140 mmol/L (ref 135–145)
Total Bilirubin: 0.4 mg/dL (ref 0.0–1.2)
Total Protein: 7.8 g/dL (ref 6.5–8.1)

## 2024-06-28 LAB — TYPE AND SCREEN
ABO/RH(D): O POS
Antibody Screen: NEGATIVE

## 2024-06-28 MED ORDER — OXYCODONE HCL 5 MG PO TABS: 5.0000 mg | ORAL_TABLET | ORAL | 0 refills | Status: DC | PRN

## 2024-06-28 MED ORDER — POTASSIUM CHLORIDE CRYS ER 20 MEQ PO TBCR
40.0000 meq | EXTENDED_RELEASE_TABLET | Freq: Once | ORAL | Status: AC
  Administered 2024-06-28: 40 meq via ORAL
  Filled 2024-06-28: qty 2

## 2024-06-28 MED ORDER — FENTANYL CITRATE (PF) 50 MCG/ML IJ SOSY
50.0000 ug | PREFILLED_SYRINGE | Freq: Once | INTRAMUSCULAR | Status: AC
  Administered 2024-06-28: 50 ug via INTRAVENOUS
  Filled 2024-06-28: qty 1

## 2024-06-28 MED ORDER — IOHEXOL 300 MG/ML  SOLN
100.0000 mL | Freq: Once | INTRAMUSCULAR | Status: AC | PRN
  Administered 2024-06-28: 100 mL via INTRAVENOUS

## 2024-06-28 NOTE — ED Triage Notes (Addendum)
 Pt came in for rectal bleeding. Pt stated she received her biopsy results on Friday and was dx with rectal cancer. Pt has been bleeding more today and was told to take 2 extra strength tylenol  pills for pain this morning around 7. Pt has also been having diarrhea.

## 2024-06-28 NOTE — ED Notes (Signed)
 Patient alert and oriented x 4. Airway patent, respirations even and unlabored. Skin normal, warm and dry. Discharge instructions discussed with patient and patient's family. Patient has no questions at this time. Patient has safe ride home.

## 2024-06-28 NOTE — Discharge Instructions (Signed)
 Take the medications to help with your pain and discomfort.  Follow-up with the cancer doctors as planned later this week.  Return to the ER for worsening pain, bleeding fevers or other concerning symptoms

## 2024-06-28 NOTE — ED Provider Notes (Signed)
 [place] EMERGENCY DEPARTMENT AT [facility] Provider Note   CSN: [id number] Arrival date & time: 06/28/24  1226     Patient presents with: Rectal Bleeding   Whitney [name] is a 83 y.o. female.    Rectal Bleeding    Patient has a history of hypothyroidism anemia diabetes colon cancer.  Patient has been having trouble with pain in the anal area for a couple of months.  Patient was initially diagnosed with external hemorrhoid.  Patient was ultimately referred to Dr. Debby general surgery who performed a biopsy.  Patient states she was informed that it is cancer.  Patient states she has had persistent pain in that area.  However since the biopsy she started noticing rectal bleeding.  She has also had some loose stools.  Patient was instructed to come to the ED for further evaluation  Prior to Admission medications   Medication Sig Start Date End Date Taking? Authorizing Provider  Accu-Chek FastClix Lancets MISC Use to check blood sugars twice a day as needed 01/22/23   Plotnikov, Aleksei V, MD  ACCU-CHEK GUIDE TEST test strip USE  STRIP TO CHECK GLUCOSE TWICE DAILY AS NEEDED 05/08/24   Plotnikov, Aleksei V, MD  Accu-Chek Softclix Lancets lancets USE AS DIRECTED TO  CHECK  BLOOD  SUGAR 06/20/24   Plotnikov, Karlynn GAILS, MD  aspirin  81 MG tablet Take 81 mg by mouth daily.    [provider]  Blood Glucose Monitoring Suppl (ACCU-CHEK GUIDE) w/Device KIT Use to check blood sugars everyday 01/22/23   Plotnikov, Aleksei V, MD  Cholecalciferol (VITAMIN D3) 50 MCG (2000 UT) capsule Take 1 capsule (2,000 Units total) by mouth daily. 07/25/19   Plotnikov, Aleksei V, MD  ciprofloxacin  (CIPRO ) 500 MG tablet Take 1 tablet (500 mg total) by mouth 2 (two) times daily. 06/02/24   Plotnikov, Aleksei V, MD  Cyanocobalamin  (VITAMIN B-12) 1000 MCG SUBL Place 1 tablet (1,000 mcg total) under the tongue daily. 04/16/11   Plotnikov, Aleksei V, MD  Docusate Sodium (COLACE PO) Take by mouth daily  before breakfast.    [provider]  hydrocortisone  2.5 % ointment Apply topically 2 (two) times daily. 04/10/24   Geofm Glade PARAS, MD  lidocaine  (XYLOCAINE ) 2 % jelly Apply 1 Application topically as needed. 06/08/24   Merlynn Niki FALCON, FNP  linaclotide  (LINZESS ) 72 MCG capsule Take 1 capsule (72 mcg total) by mouth daily before breakfast. 06/08/24   Merlynn Niki FALCON, FNP  MegaRed Omega-3 Krill Oil 500 MG CAPS Take 1 capsule by mouth every morning. 12/11/22   Plotnikov, Aleksei V, MD  Multiple Vitamins-Minerals (WOMENS MULTI GUMMIES PO) Take by mouth.    [provider]  oxyCODONE  (ROXICODONE ) 5 MG immediate release tablet Take 1 tablet (5 mg total) by mouth every 4 (four) hours as needed for severe pain (pain score 7-10). 06/28/24   Randol Simmonds, MD  rosuvastatin  (CRESTOR ) 5 MG tablet Take 1 tablet by mouth once daily 02/24/24   Plotnikov, Aleksei V, MD  SYNTHROID  125 MCG tablet Take 1 tablet by mouth once daily 12/23/23   Plotnikov, Aleksei V, MD  triamcinolone  ointment (KENALOG ) 0.5 % Apply 1 Application topically 4 (four) times daily. 06/02/24 06/02/25  Plotnikov, Karlynn GAILS, MD    Allergies: Benzocaine-menthol, Codeine phosphate, and Penicillins    Review of Systems  Gastrointestinal:  Positive for hematochezia.    Updated Vital Signs BP (!) 167/64 (BP Location: Right Arm)   Pulse 72   Temp 97.7 F (36.5 C) (  Oral)   Resp 16   SpO2 96%   Physical Exam Vitals and nursing note reviewed.  Constitutional:      Appearance: She is well-developed. She is ill-appearing. She is not diaphoretic.  HENT:     Head: Normocephalic and atraumatic.     Right Ear: External ear normal.     Left Ear: External ear normal.  Eyes:     General: No scleral icterus.       Right eye: No discharge.        Left eye: No discharge.     Conjunctiva/sclera: Conjunctivae normal.  Neck:     Trachea: No tracheal deviation.  Cardiovascular:     Rate and Rhythm: Normal rate and regular rhythm.   Pulmonary:     Effort: Pulmonary effort is normal. No respiratory distress.     Breath sounds: Normal breath sounds. No stridor. No wheezing or rales.  Abdominal:     General: Bowel sounds are normal. There is no distension.     Palpations: Abdomen is soft.     Tenderness: There is no abdominal tenderness. There is no guarding or rebound.  Musculoskeletal:        General: No tenderness or deformity.     Cervical back: Neck supple.  Skin:    General: Skin is warm and dry.     Findings: No rash.  Neurological:     General: No focal deficit present.     Mental Status: She is alert.     Cranial Nerves: No cranial nerve deficit, dysarthria or facial asymmetry.     Sensory: No sensory deficit.     Motor: No abnormal muscle tone or seizure activity.     Coordination: Coordination normal.  Psychiatric:        Mood and Affect: Mood normal.     (all labs ordered are listed, but only abnormal results are displayed) Labs Reviewed  COMPREHENSIVE METABOLIC PANEL WITH GFR - Abnormal; Notable for the following components:      Result Value   Potassium 2.9 (*)    Glucose, Bld 150 (*)    AST 69 (*)    ALT 70 (*)    All other components within normal limits  CBC - Abnormal; Notable for the following components:   WBC 13.4 (*)    All other components within normal limits  TYPE AND SCREEN    EKG: None  Radiology: CT ABDOMEN PELVIS W CONTRAST Result Date: 06/28/2024 EXAM: CT ABDOMEN AND PELVIS WITH CONTRAST 06/28/2024 03:44:08 PM TECHNIQUE: CT of the abdomen and pelvis was performed with the administration of 100 mL of iohexol  (OMNIPAQUE ) 300 MG/ML solution. Multiplanar reformatted images are provided for review. Automated exposure control, iterative reconstruction, and/or weight-based adjustment of the mA/kV was utilized to reduce the radiation dose to as low as reasonably achievable. COMPARISON: 01/03/2009 CLINICAL HISTORY: Recent anal cancer diagnosis; diverticulitis. * Tracking Code: BO  * FINDINGS: LOWER CHEST: Contrast medium in the distal esophagus suggesting dysmotility or reflux. Lower descending thoracic aortic atherosclerosis. Mild cardiomegaly. LIVER: The liver is unremarkable. GALLBLADDER AND BILE DUCTS: Cholecystectomy. No biliary ductal dilatation. SPLEEN: No acute abnormality. PANCREAS: No acute abnormality. ADRENAL GLANDS: No acute abnormality. KIDNEYS, URETERS AND BLADDER: No stones in the kidneys or ureters. No hydronephrosis. No perinephric or periureteral stranding. Urinary bladder is unremarkable. GI AND BOWEL: Prominent irregularity and abnormal enhancement along the anal canal likely reflecting the patient's known anal mass, as on image 99 series 7 and image 84 series 9. Possible ulceration  along the anal canal with irregular somewhat PADULOUS appearance. The area of irregularity measures about 4.2 x 3.7 x 3.9 cm. Prominence of frothy fluid in the colon. No dilated small bowel. Stomach demonstrates no acute abnormality. There is no bowel obstruction. PERITONEUM AND RETROPERITONEUM: No ascites. No free air. VASCULATURE: Aorta is normal in caliber. Atheromatous plaque dorsally at the origins of the celiac trunk and superior mesenteric artery without high grade stenosis or occlusion. Systemic atherosclerosis is present, including the aorta and iliac arteries. LYMPH NODES: 0.4 cm perirectal lymph node along the left posterior perirectal adipose tissue on image 78 series 2. Left external iliac node 0.8 cm in diameter, generally considered within normal size limits. REPRODUCTIVE ORGANS: No acute abnormality. BONES AND SOFT TISSUES: Lumbar spondylosis and degenerative disc disease with moderate left foraminal impingement suspected at L3-L4 and L4-L5. No acute osseous abnormality. No focal soft tissue abnormality. IMPRESSION: 1. Prominent irregularity and abnormal enhancement along the anal canal consistent with anal malignancy, possible ulceration, measuring approximately 4.2 x 3.7 x  3.9 cm. 2. Small left posterior perirectal lymph node measuring 0.4 cm. 3. Mild prominence of frothy fluid throughout the colon. No current significant diverticulosis or active diverticulitis. 4. Incidental findings include contrast in the distal esophagus suggesting dysmotility or reflux, atherosclerosis involving the aorta and iliac arteries with plaque at the celiac and superior mesenteric artery origins without high-grade stenosis or occlusion, mild cardiomegaly, status post cholecystectomy, and lumbar spondylosis and degenerative disc disease with suspected moderate left foraminal impingement at L3-4 and L4-5. Electronically signed by: Ryan Salvage MD 06/28/2024 04:30 PM EST RP Workstation: [id number]     Procedures   Medications Ordered in the ED  fentaNYL  (SUBLIMAZE ) injection 50 mcg (50 mcg Intravenous Given 06/28/24 1344)  potassium chloride  SA (KLOR-CON  M) CR tablet 40 mEq (40 mEq Oral Given 06/28/24 1447)  iohexol  (OMNIPAQUE ) 300 MG/ML solution 100 mL (100 mLs Intravenous Contrast Given 06/28/24 1544)  fentaNYL  (SUBLIMAZE ) injection 50 mcg (50 mcg Intravenous Given 06/28/24 1627)    Clinical Course as of 06/28/24 1844  Sun Jun 28, 2024  1417 CBC(!) White blood cell count increased compared to previous. [JK]  1417 Comprehensive metabolic panel(!) Metabolic panel shows hypokalemia [JK]  1633 CT scan abdomen pelvis shows prominent irregularity along the anal canal.  Patient does not evidence of diverticulitis.  Incidental findings documented in the radiology report noted.  Doubt related to her acute symptoms [JK]    Clinical Course User Index [JK] Randol Simmonds, MD                                 Medical Decision Making Problems Addressed: Anal cancer [facility]): acute illness or injury that poses a threat to life or bodily functions  Amount and/or Complexity of Data Reviewed Labs: ordered. Decision-making details documented in ED Course. Radiology: ordered and independent  interpretation performed.  Risk Prescription drug management.   Patient presented to the ED for evaluation of rectal pain as well as rectal bleeding.  Patient has been having issues with what was felt to be hemorrhoidal bleeding for a couple of months.  Patient ultimately followed up with general surgery after he was found to have an anal mass.  Patient underwent biopsy.  Patient presented because she was concerned about rectal bleeding and persistent pain.  No signs of any active bleeding here in the ED.  Patient's laboratory test did not show any signs of acute anemia  CT scan was performed for further evaluation concerning her pain.  The CT scan does not show any signs of abscess.  No signs of acute abdominal process.  No infection.  Anal lesion noted on skin that is consistent with her malignancy.  Patient's pain was controlled with IV narcotic pain medications.  Patient does not have any pain medications at home at this time other than Tylenol .  He gets reasonable for course of oxycodone  considering the pain that she is having with this malignancy.  I do not feel that she requires admission to the hospital for GI bleeding as I suspect that the lesion is what is causing the blood in her stool.  Patient does have plans to follow-up with oncology this week  After patient was discharged they said they were not able to pick up the pain medications at [facility].  They asked me to change it to the [facility] across the street    Final diagnoses:  Anal cancer [facility])    ED Discharge Orders          Ordered    oxyCODONE  (ROXICODONE ) 5 MG immediate release tablet  Every 4 hours PRN,   Status:  Discontinued        06/28/24 1728    oxyCODONE  (ROXICODONE ) 5 MG immediate release tablet  Every 4 hours PRN,   Status:  Discontinued        06/28/24 1751    oxyCODONE  (ROXICODONE ) 5 MG immediate release tablet  Every 4 hours PRN        06/28/24 1844               Randol Simmonds, MD 06/28/24 1846

## 2024-06-29 ENCOUNTER — Other Ambulatory Visit: Payer: Self-pay | Admitting: Internal Medicine

## 2024-06-29 ENCOUNTER — Telehealth: Payer: Self-pay

## 2024-06-29 NOTE — Telephone Encounter (Signed)
 Returned the patient's call: The patient contacted the after-hours line and reported experiencing uncontrollable bleeding (not vaginal). After discussing with the patient, she stated that she is not currently bleeding and that the emergency department provider provided her with pain medication. She reports feeling better at this time and has no current concerns. The patient is scheduled to be seen by her healthcare provider later this week.

## 2024-06-30 NOTE — Telephone Encounter (Signed)
 She should have a prescription for triamcinolone  0.5% ointment at home which is a stronger preparation than hydrocortisone .  Use triamcinolone .  Okay to refill.  Thank you

## 2024-06-30 NOTE — Telephone Encounter (Signed)
 Please see my other messages

## 2024-06-30 NOTE — Telephone Encounter (Signed)
 Called and relayed message to pt, pt verbalized understanding

## 2024-06-30 NOTE — Telephone Encounter (Signed)
 See my other message.  She was put on oxycodone  by ER on 06/28/2024. Will renew when/if needed.

## 2024-06-30 NOTE — Telephone Encounter (Signed)
 The patient was seen in the ER on 06/28/2024 and prescribed oxycodone  #30.  Will renew if needed.  Pathology-squamous cell carcinoma (skin, anal mass).  I am sorry about cancer diagnosis.  Follow-up with Dr. Debby Thanks

## 2024-07-01 NOTE — Telephone Encounter (Signed)
 Patient has since been seen by ED and provided oxycodone  script

## 2024-07-02 ENCOUNTER — Other Ambulatory Visit: Payer: Self-pay | Admitting: *Deleted

## 2024-07-02 ENCOUNTER — Encounter: Payer: Self-pay | Admitting: Nurse Practitioner

## 2024-07-02 ENCOUNTER — Inpatient Hospital Stay: Admitting: Nurse Practitioner

## 2024-07-02 ENCOUNTER — Encounter: Payer: Self-pay | Admitting: *Deleted

## 2024-07-02 VITALS — BP 140/62 | HR 84 | Temp 97.6°F | Resp 18 | Ht 61.0 in | Wt 135.9 lb

## 2024-07-02 DIAGNOSIS — Z800 Family history of malignant neoplasm of digestive organs: Secondary | ICD-10-CM | POA: Insufficient documentation

## 2024-07-02 DIAGNOSIS — Z803 Family history of malignant neoplasm of breast: Secondary | ICD-10-CM | POA: Insufficient documentation

## 2024-07-02 DIAGNOSIS — Z5111 Encounter for antineoplastic chemotherapy: Secondary | ICD-10-CM | POA: Insufficient documentation

## 2024-07-02 DIAGNOSIS — C210 Malignant neoplasm of anus, unspecified: Secondary | ICD-10-CM

## 2024-07-02 DIAGNOSIS — Z79891 Long term (current) use of opiate analgesic: Secondary | ICD-10-CM | POA: Insufficient documentation

## 2024-07-02 DIAGNOSIS — R159 Full incontinence of feces: Secondary | ICD-10-CM | POA: Insufficient documentation

## 2024-07-02 DIAGNOSIS — Z85038 Personal history of other malignant neoplasm of large intestine: Secondary | ICD-10-CM | POA: Insufficient documentation

## 2024-07-02 DIAGNOSIS — Z9049 Acquired absence of other specified parts of digestive tract: Secondary | ICD-10-CM | POA: Insufficient documentation

## 2024-07-02 DIAGNOSIS — E538 Deficiency of other specified B group vitamins: Secondary | ICD-10-CM

## 2024-07-02 DIAGNOSIS — G893 Neoplasm related pain (acute) (chronic): Secondary | ICD-10-CM | POA: Insufficient documentation

## 2024-07-02 DIAGNOSIS — E039 Hypothyroidism, unspecified: Secondary | ICD-10-CM | POA: Diagnosis not present

## 2024-07-02 DIAGNOSIS — R7303 Prediabetes: Secondary | ICD-10-CM | POA: Diagnosis not present

## 2024-07-02 MED ORDER — OXYCODONE HCL 5 MG PO TABS: 5.0000 mg | ORAL_TABLET | ORAL | 0 refills | Status: DC | PRN

## 2024-07-02 NOTE — Progress Notes (Unsigned)
 PATIENT NAVIGATOR PROGRESS NOTE  Name: Whitney [name] Date: 07/02/2024 MRN: [id number]  DOB: 12-Jul-1941   Reason for visit:  New patient appt  Comments:  Met with Whitney [name] and her son Whitney [name] during visit with Olam Ned NP and Dr Cloretta  Given Journeys notebook with disease specific information Order placed for PET scan to stage anal cancer Discussed PICC line and given information on Mitomycin and Fluorouracil therapy Will check status radiation oncology consult  Given contact number to call with any questions FMLA paperwork sent to team for completion for son Whitney [name]    Time spent counseling/coordinating care: > 60 minutes

## 2024-07-02 NOTE — Progress Notes (Signed)
 New Hematology/Oncology Consult   Requesting MD: Dr. Bernarda Ned  [phone]      Reason for Consult: Anal cancer  HPI: Ms. Whitney [name] is an 83 year old woman recently diagnosed with anal cancer.  She was seen by Dr. Ned on 06/23/2024 for evaluation of a chronically inflamed external hemorrhoid causing pain.  On rectal exam she was found to have a left lateral firm fixed mass.  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  She was seen in the emergency department 06/28/2024 to evaluate rectal bleeding and pain.  There were no signs of active bleeding in the emergency department.  Hemoglobin was stable in normal range.  CT abdomen/pelvis showed prominent irregularity and abnormal enhancement along the anal canal; possible ulceration along the anal canal with irregular somewhat patulous appearance.  The area of irregularity measured about 4.2 x 3.7 x 3.9 cm.  A 0.4 cm perirectal lymph node noted along the left posterior perirectal adipose tissue; left external iliac node 0.8 cm.  She has been referred to radiation oncology and for a PET scan.   Past Medical History:  Diagnosis Date   Anemia    Iron def   B12 deficiency    Cancer (HCC)    colon ca   Depression    Diabetes mellitus without complication (HCC)    History of colon cancer 2008   Hypothyroidism    Uterine polyp      Past Surgical History:  Procedure Laterality Date   BREAST BIOPSY Right 10/08/1995   Benign Excisional Biopsy   CHOLECYSTECTOMY     COLON SURGERY  2008   Resection    Current Medications[1]:    Allergies[2]:  FH: Paternal cousin with breast cancer.  Paternal cousin with colon cancer.  SOCIAL HISTORY: She lives in [place] with her son.  She is retired.  She previously worked in chief financial officer.  No tobacco or alcohol use.  Review of Systems: Appetite is poor.  She feels she is likely losing weight.  No fever.  She is no longer having rectal bleeding.  She continues to  have significant rectal pain.  She is taking oxycodone  every 4 hours and extra-strength Tylenol  4 times a day.  No dysphagia.  No cough or shortness of breath at rest.  Her son notices that she develops shortness of breath with long walks.  She is incontinent of stool.  Stool is described as liquid.  No urinary symptoms.  No numbness or tingling in the hands or feet.  Physical Exam:  Blood pressure (!) 140/62, pulse 84, temperature 97.6 F (36.4 C), temperature source Temporal, resp. rate 18, height 5' 1 (1.549 m), weight 135 lb 14.4 oz (61.6 kg), SpO2 98%.  Lungs: Lungs clear bilaterally. Cardiac: Regular rate and rhythm. Abdomen: No hepatosplenomegaly.  Large fungating cauliflower appearing firm mass at the left anal verge extending to the anal margin.  Internal exam deferred due to severe pain. Vascular: No leg edema. Lymph nodes: No palpable cervical, supraclavicular, axillary or inguinal lymph nodes. Neurologic: Alert and oriented.   LABS:  No results for input(s): WBC, HGB, HCT, PLT in the last 72 hours.  No results for input(s): NA, K, CL, CO2, GLUCOSE, BUN, CREATININE, CALCIUM  in the last 72 hours.    RADIOLOGY:  CT ABDOMEN PELVIS W CONTRAST Result Date: 06/28/2024 EXAM: CT ABDOMEN AND PELVIS WITH CONTRAST 06/28/2024 03:44:08 PM TECHNIQUE: CT of the abdomen and pelvis was performed with the administration of 100 mL of iohexol  (OMNIPAQUE ) 300 MG/ML solution. Multiplanar reformatted  images are provided for review. Automated exposure control, iterative reconstruction, and/or weight-based adjustment of the mA/kV was utilized to reduce the radiation dose to as low as reasonably achievable. COMPARISON: 01/03/2009 CLINICAL HISTORY: Recent anal cancer diagnosis; diverticulitis. * Tracking Code: BO * FINDINGS: LOWER CHEST: Contrast medium in the distal esophagus suggesting dysmotility or reflux. Lower descending thoracic aortic atherosclerosis. Mild cardiomegaly.  LIVER: The liver is unremarkable. GALLBLADDER AND BILE DUCTS: Cholecystectomy. No biliary ductal dilatation. SPLEEN: No acute abnormality. PANCREAS: No acute abnormality. ADRENAL GLANDS: No acute abnormality. KIDNEYS, URETERS AND BLADDER: No stones in the kidneys or ureters. No hydronephrosis. No perinephric or periureteral stranding. Urinary bladder is unremarkable. GI AND BOWEL: Prominent irregularity and abnormal enhancement along the anal canal likely reflecting the patient's known anal mass, as on image 99 series 7 and image 84 series 9. Possible ulceration along the anal canal with irregular somewhat PADULOUS appearance. The area of irregularity measures about 4.2 x 3.7 x 3.9 cm. Prominence of frothy fluid in the colon. No dilated small bowel. Stomach demonstrates no acute abnormality. There is no bowel obstruction. PERITONEUM AND RETROPERITONEUM: No ascites. No free air. VASCULATURE: Aorta is normal in caliber. Atheromatous plaque dorsally at the origins of the celiac trunk and superior mesenteric artery without high grade stenosis or occlusion. Systemic atherosclerosis is present, including the aorta and iliac arteries. LYMPH NODES: 0.4 cm perirectal lymph node along the left posterior perirectal adipose tissue on image 78 series 2. Left external iliac node 0.8 cm in diameter, generally considered within normal size limits. REPRODUCTIVE ORGANS: No acute abnormality. BONES AND SOFT TISSUES: Lumbar spondylosis and degenerative disc disease with moderate left foraminal impingement suspected at L3-L4 and L4-L5. No acute osseous abnormality. No focal soft tissue abnormality. IMPRESSION: 1. Prominent irregularity and abnormal enhancement along the anal canal consistent with anal malignancy, possible ulceration, measuring approximately 4.2 x 3.7 x 3.9 cm. 2. Small left posterior perirectal lymph node measuring 0.4 cm. 3. Mild prominence of frothy fluid throughout the colon. No current significant diverticulosis or  active diverticulitis. 4. Incidental findings include contrast in the distal esophagus suggesting dysmotility or reflux, atherosclerosis involving the aorta and iliac arteries with plaque at the celiac and superior mesenteric artery origins without high-grade stenosis or occlusion, mild cardiomegaly, status post cholecystectomy, and lumbar spondylosis and degenerative disc disease with suspected moderate left foraminal impingement at L3-4 and L4-5. Electronically signed by: Ryan Salvage MD 06/28/2024 04:30 PM EST RP Workstation: [id number]    Assessment and Plan:   Anal cancer Rectal exam by Dr. Debby 06/23/2024-left lateral firm fixed mass, biopsy showed well-differentiated squamous cell carcinoma CTs 06/28/2024-prominent irregularity and abnormal enhancement along the anal canal; possible ulceration along the anal canal with irregular somewhat patulous appearance.  The area of irregularity measured about 4.2 x 3.7 x 3.9 cm.  A 0.4 cm perirectal lymph node noted along the left posterior perirectal adipose tissue; left external iliac node 0.8 cm. Rectal pain and bleeding secondary to #1 History of colon cancer status post right colectomy 05/06/2007, T3 N0 Hypothyroid Prediabetes B12 deficiency  Whitney [name] was recently diagnosed with anal cancer.  She has been referred for a staging PET scan.  She has been referred to radiation oncology.  We discussed chemotherapy with mitomycin and 5-fluorouracil.  We reviewed potential toxicities associated with chemotherapy including bone marrow toxicity, nausea, hair loss.  We discussed the hemolytic uremic syndrome associated with Mitomycin-C.  We discussed the potential for mouth sores, diarrhea, hand-foot syndrome, skin hyperpigmentation, increased sensitivity to sun,  skin rash and cardiotoxicity with 5-fluorouracil.  She was provided with printed information.  She understands a PICC line is needed for this regimen.  She agrees to proceed as above.  She  will attend a chemotherapy education class.  We have contacted radiation oncology.  Estimated start date for radiation therapy 07/20/2024.  She will have a PICC line placed on 07/17/2024.  She is symptomatic with pain.  Oxycodone  refill sent to her pharmacy today.  We will see her in follow-up prior to cycle one 5-FU/Mitomycin-C on 07/20/2024.  We are available to see her sooner if needed.  Patient seen with Dr. Cloretta.    Olam Ned, NP 07/02/2024, 3:21 PM Whitney [name] was interviewed and examined.  I reviewed the 06/28/2024 CT images. She has been diagnosed with anal cancer.  There is no clinical or radiologic evidence of distant metastatic disease.  She will undergo a staging PET scan.  I recommend treatment with concurrent 5-FU/Mitomycin-C and radiation if the PET scan reveals no evidence of metastatic disease.  The goal of treatment is curative.  We reviewed potential toxicities associated with the 5-FU/Mitomycin-C regimen including the chance of hematologic toxicity, infection, bleeding, and skin breakdown at the groin/perineum.  She agrees to proceed.  She will attend a chemotherapy teaching class.  We will refer her for PICC placement prior to beginning chemotherapy.  Whitney [name] will be referred to radiation oncology.  The plan is to initiate systemic therapy and radiation 07/20/2024 unless radiation is able to begin treatment sooner.  She has pain secondary to the anal mass requiring frequent dosing with oxycodone .  We refilled the prescription for oxycodone  today.  A treatment plan was entered today.  Arvella Cloretta, MD    [1]  Current Outpatient Medications:    Accu-Chek FastClix Lancets MISC, Use to check blood sugars twice a day as needed, Disp: 102 each, Rfl: 0   ACCU-CHEK GUIDE TEST test strip, USE  STRIP TO CHECK GLUCOSE TWICE DAILY AS NEEDED, Disp: 100 each, Rfl: 0   Accu-Chek Softclix Lancets lancets, USE AS DIRECTED TO  CHECK  BLOOD  SUGAR, Disp: 100 each,  Rfl: 0   aspirin  81 MG tablet, Take 81 mg by mouth daily., Disp: , Rfl:    Blood Glucose Monitoring Suppl (ACCU-CHEK GUIDE) w/Device KIT, Use to check blood sugars everyday, Disp: 1 kit, Rfl: 0   Cholecalciferol (VITAMIN D3) 50 MCG (2000 UT) capsule, Take 1 capsule (2,000 Units total) by mouth daily., Disp: 100 capsule, Rfl: 3   Cyanocobalamin  (VITAMIN B-12) 1000 MCG SUBL, Place 1 tablet (1,000 mcg total) under the tongue daily., Disp: 100 tablet, Rfl: 3   MegaRed Omega-3 Krill Oil 500 MG CAPS, Take 1 capsule by mouth every morning. (Patient taking differently: Take 1 capsule by mouth every morning. Taking 2 capsules PO daily), Disp: 100 capsule, Rfl: 3   Multiple Vitamins-Minerals (WOMENS MULTI GUMMIES PO), Take by mouth., Disp: , Rfl:    oxyCODONE  (ROXICODONE ) 5 MG immediate release tablet, Take 1 tablet (5 mg total) by mouth every 4 (four) hours as needed for severe pain (pain score 7-10)., Disp: 30 tablet, Rfl: 0   rosuvastatin  (CRESTOR ) 5 MG tablet, Take 1 tablet by mouth once daily, Disp: 90 tablet, Rfl: 3   SYNTHROID  125 MCG tablet, Take 1 tablet by mouth once daily, Disp: 90 tablet, Rfl: 2   ciprofloxacin  (CIPRO ) 500 MG tablet, Take 1 tablet (500 mg total) by mouth 2 (two) times daily. (Patient not taking: Reported on 07/02/2024), Disp: 14  tablet, Rfl: 0   Docusate Sodium (COLACE PO), Take by mouth daily before breakfast. (Patient not taking: Reported on 07/02/2024), Disp: , Rfl:    hydrocortisone  2.5 % ointment, Apply topically 2 (two) times daily. (Patient not taking: Reported on 07/02/2024), Disp: 30 g, Rfl: 0   lidocaine  (XYLOCAINE ) 2 % jelly, Apply 1 Application topically as needed. (Patient not taking: Reported on 07/02/2024), Disp: 20 mL, Rfl: 0   linaclotide  (LINZESS ) 72 MCG capsule, Take 1 capsule (72 mcg total) by mouth daily before breakfast. (Patient not taking: Reported on 07/02/2024), Disp: 30 capsule, Rfl: 2   triamcinolone  ointment (KENALOG ) 0.5 %, Apply 1 Application topically  4 (four) times daily. (Patient not taking: Reported on 07/02/2024), Disp: 120 g, Rfl: 1 [2]  Allergies Allergen Reactions   Benzocaine-Menthol Other (See Comments)    Closes pt's throat up. Problems breathing   Codeine Phosphate    Penicillins

## 2024-07-02 NOTE — Progress Notes (Signed)
 START ON PATHWAY REGIMEN - Anal Carcinoma     One cycle, concurrent with RT:     Fluorouracil      Mitomycin   **Always confirm dose/schedule in your pharmacy ordering system**  Patient Characteristics: Anal Canal Tumors, Newly Diagnosed - Locoregional Disease (Clinical Staging) Therapeutic Status: Newly Diagnosed - Locoregional Disease (Clinical Staging) Check here if patient was staged using an edition other than AJCC Staging 9th Edition: false AJCC T Category: cT2 AJCC N Category: cN0 AJCC M Category: cM0 AJCC 9 Stage Grouping: IIA Intent of Therapy: Curative Intent, Discussed with Patient

## 2024-07-03 ENCOUNTER — Telehealth: Payer: Self-pay | Admitting: Oncology

## 2024-07-03 ENCOUNTER — Encounter: Payer: Self-pay | Admitting: Oncology

## 2024-07-03 NOTE — Telephone Encounter (Signed)
 Called PT's son to scheduled PT FU appts. Days and time confirmed. Appts dates requested by PT  son.

## 2024-07-05 ENCOUNTER — Other Ambulatory Visit: Payer: Self-pay

## 2024-07-06 ENCOUNTER — Encounter: Payer: Self-pay | Admitting: Oncology

## 2024-07-06 ENCOUNTER — Telehealth: Payer: Self-pay

## 2024-07-06 ENCOUNTER — Other Ambulatory Visit: Payer: Self-pay

## 2024-07-06 DIAGNOSIS — C210 Malignant neoplasm of anus, unspecified: Secondary | ICD-10-CM

## 2024-07-06 MED ORDER — POTASSIUM CHLORIDE CRYS ER 20 MEQ PO TBCR: 20.0000 meq | EXTENDED_RELEASE_TABLET | Freq: Every day | ORAL | 1 refills | Status: AC

## 2024-07-06 NOTE — Telephone Encounter (Signed)
-----   Message from Olam Ned, NP sent at 07/03/2024  8:13 AM EST ----- Ask her to hold multivitamin during treatment, potassium was low 06/28/2024, start potassium chloride  20 mEq daily, CMP next visit here

## 2024-07-06 NOTE — Telephone Encounter (Signed)
 Patient gave verbal understanding and had no further questions.

## 2024-07-08 ENCOUNTER — Encounter: Admission: RE | Admit: 2024-07-08

## 2024-07-08 ENCOUNTER — Inpatient Hospital Stay

## 2024-07-08 ENCOUNTER — Telehealth: Payer: Self-pay | Admitting: Radiation Oncology

## 2024-07-08 ENCOUNTER — Other Ambulatory Visit: Payer: Self-pay

## 2024-07-08 DIAGNOSIS — C210 Malignant neoplasm of anus, unspecified: Secondary | ICD-10-CM | POA: Insufficient documentation

## 2024-07-08 DIAGNOSIS — Z510 Encounter for antineoplastic radiation therapy: Secondary | ICD-10-CM | POA: Insufficient documentation

## 2024-07-08 DIAGNOSIS — E538 Deficiency of other specified B group vitamins: Secondary | ICD-10-CM | POA: Insufficient documentation

## 2024-07-08 DIAGNOSIS — Z5111 Encounter for antineoplastic chemotherapy: Secondary | ICD-10-CM | POA: Insufficient documentation

## 2024-07-08 LAB — CMP (CANCER CENTER ONLY)
ALT: 31 U/L (ref 0–44)
AST: 25 U/L (ref 15–41)
Albumin: 3.9 g/dL (ref 3.5–5.0)
Alkaline Phosphatase: 121 U/L (ref 38–126)
Anion gap: 12 (ref 5–15)
BUN: 12 mg/dL (ref 8–23)
CO2: 29 mmol/L (ref 22–32)
Calcium: 10 mg/dL (ref 8.9–10.3)
Chloride: 98 mmol/L (ref 98–111)
Creatinine: 0.86 mg/dL (ref 0.44–1.00)
GFR, Estimated: >60 mL/min (ref >60)
Glucose, Bld: 147 mg/dL — ABNORMAL HIGH (ref 70–99)
Potassium: 4 mmol/L (ref 3.5–5.1)
Sodium: 139 mmol/L (ref 135–145)
Total Bilirubin: 0.5 mg/dL (ref 0.0–1.2)
Total Protein: 7.8 g/dL (ref 6.5–8.1)

## 2024-07-08 LAB — CBC WITH DIFFERENTIAL (CANCER CENTER ONLY)
Abs Immature Granulocytes: 0.07 K/uL (ref 0.00–0.07)
Basophils Absolute: 0.1 K/uL (ref 0.0–0.1)
Basophils Relative: 0 %
Eosinophils Absolute: 0.1 K/uL (ref 0.0–0.5)
Eosinophils Relative: 1 %
HCT: 36.2 % (ref 36.0–46.0)
Hemoglobin: 12.1 g/dL (ref 12.0–15.0)
Immature Granulocytes: 0 %
Lymphocytes Relative: 12 %
Lymphs Abs: 2.1 K/uL (ref 0.7–4.0)
MCH: 28.8 pg (ref 26.0–34.0)
MCHC: 33.4 g/dL (ref 30.0–36.0)
MCV: 86.2 fL (ref 80.0–100.0)
Monocytes Absolute: 1 K/uL (ref 0.1–1.0)
Monocytes Relative: 6 %
Neutro Abs: 13.4 K/uL — ABNORMAL HIGH (ref 1.7–7.7)
Neutrophils Relative %: 81 %
Platelet Count: 536 K/uL — ABNORMAL HIGH (ref 150–400)
RBC: 4.2 MIL/uL (ref 3.87–5.11)
RDW: 14.3 % (ref 11.5–15.5)
WBC Count: 16.7 K/uL — ABNORMAL HIGH (ref 4.0–10.5)
nRBC: 0 % (ref 0.0–0.2)

## 2024-07-08 LAB — GLUCOSE, CAPILLARY: Glucose-Capillary: 139 mg/dL — ABNORMAL HIGH (ref 70–99)

## 2024-07-08 MED ORDER — FLUDEOXYGLUCOSE F - 18 (FDG) INJECTION
7.0000 | Freq: Once | INTRAVENOUS | Status: AC | PRN
  Administered 2024-07-08: 15:00:00 6.75 via INTRAVENOUS

## 2024-07-08 NOTE — Telephone Encounter (Signed)
 Per Sari Priestly, contacted son to schedule consult; no answer, LVM with best c/b number.

## 2024-07-09 ENCOUNTER — Ambulatory Visit: Admission: RE | Admit: 2024-07-09 | Source: Ambulatory Visit

## 2024-07-09 ENCOUNTER — Encounter: Payer: Self-pay | Admitting: Radiation Oncology

## 2024-07-09 ENCOUNTER — Ambulatory Visit: Admission: RE | Admit: 2024-07-09 | Source: Ambulatory Visit | Admitting: Radiation Oncology

## 2024-07-09 DIAGNOSIS — C210 Malignant neoplasm of anus, unspecified: Secondary | ICD-10-CM

## 2024-07-09 NOTE — Progress Notes (Signed)
 Radiation Oncology         [phone] ________________________________  Initial Outpatient Consultation - Conducted via telephone at patient request.  I spoke with the patient to conduct this consult visit via telephone. The patient was notified in advance and was offered an in person or telemedicine meeting to allow for face to face communication but instead preferred to proceed with a telephone consult.    Name: Whitney [name]        MRN: [id number]  Date of Service: 07/09/2024 DOB: 06-07-1941  RR:Eonuwpxnc, Karlynn GAILS, MD  Debby Hila, MD     REFERRING PHYSICIAN: Debby Hila, MD   DIAGNOSIS: The encounter diagnosis was Anal cancer [facility]).   HISTORY OF PRESENT ILLNESS: Whitney [name] is a 83 y.o. female seen at the request of Dr. Debby for a new diagnosis of anal cancer. She has a remote history of early stage pT3N0 colon cancer treated in 2008 with a right colectomy without recurrence. She noticed bleeding and pain per rectum about 3 months ago and this prompted evaluation with persistence in November. She saw her PCP and he referred her for evaluation with Dr. Debby. A punch biopsy performed of the anus in the office on 06/22/24 showed an invasive squamous cell carcinoma. A CT abdomen/pelvis on 06/28/24 showed a 4.2 cm mss in the anal canal with possible ulceration and a small posterior perirectal node on the left measuring 4 mm. Incidental dysmotility of the esophagus was noted. She then met with Dr. Cloretta and recommended chemoradiation and a PET Scan. The PET was performed yesterday but has not been read. Our office has reached out on a few occasions to try to schedule an appointment but she was available today to meet for consultation.      PREVIOUS RADIATION THERAPY: No   PAST MEDICAL HISTORY:  Past Medical History:  Diagnosis Date   Anemia    Iron def   B12 deficiency    Cancer (HCC)    colon ca   Depression    Diabetes mellitus without complication  (HCC)    History of colon cancer 2008   Hypothyroidism    Uterine polyp        PAST SURGICAL HISTORY: Past Surgical History:  Procedure Laterality Date   BREAST BIOPSY Right 10/08/1995   Benign Excisional Biopsy   CHOLECYSTECTOMY     COLON SURGERY  2008   Resection     FAMILY HISTORY:  Family History  Problem Relation Age of Onset   Hypertension Mother    Heart disease Mother 75       CHF   Heart disease Father 67       MI died   Heart disease Other        CAD and CHF   Breast cancer Cousin      SOCIAL HISTORY:  reports that she has quit smoking. She has never used smokeless tobacco. She reports that she does not drink alcohol and does not use drugs. The patient is widowed and lives in [place]. Her son Deward lives with her and is a fish farm manager carrier.    ALLERGIES: Benzocaine-menthol, Codeine phosphate, and Penicillins   MEDICATIONS:  Current Outpatient Medications  Medication Sig Dispense Refill   Accu-Chek FastClix Lancets MISC Use to check blood sugars twice a day as needed 102 each 0   ACCU-CHEK GUIDE TEST test strip USE  STRIP TO CHECK GLUCOSE TWICE DAILY AS NEEDED 100 each 0   Accu-Chek Softclix Lancets lancets USE AS  DIRECTED TO  CHECK  BLOOD  SUGAR 100 each 0   aspirin  81 MG tablet Take 81 mg by mouth daily.     Blood Glucose Monitoring Suppl (ACCU-CHEK GUIDE) w/Device KIT Use to check blood sugars everyday 1 kit 0   Cholecalciferol (VITAMIN D3) 50 MCG (2000 UT) capsule Take 1 capsule (2,000 Units total) by mouth daily. 100 capsule 3   Cyanocobalamin  (VITAMIN B-12) 1000 MCG SUBL Place 1 tablet (1,000 mcg total) under the tongue daily. 100 tablet 3   MegaRed Omega-3 Krill Oil 500 MG CAPS Take 1 capsule by mouth every morning. (Patient taking differently: Take 1 capsule by mouth every morning. Taking 2 capsules PO daily) 100 capsule 3   Multiple Vitamins-Minerals (WOMENS MULTI GUMMIES PO) Take by mouth.     oxyCODONE  (ROXICODONE ) 5 MG immediate release tablet  Take 1 tablet (5 mg total) by mouth every 4 (four) hours as needed for severe pain (pain score 7-10). 60 tablet 0   potassium chloride  SA (KLOR-CON  M) 20 MEQ tablet Take 1 tablet (20 mEq total) by mouth daily. 30 tablet 1   rosuvastatin  (CRESTOR ) 5 MG tablet Take 1 tablet by mouth once daily 90 tablet 3   SYNTHROID  125 MCG tablet Take 1 tablet by mouth once daily 90 tablet 2   ciprofloxacin  (CIPRO ) 500 MG tablet Take 1 tablet (500 mg total) by mouth 2 (two) times daily. (Patient not taking: Reported on 07/09/2024) 14 tablet 0   Docusate Sodium (COLACE PO) Take by mouth daily before breakfast. (Patient not taking: Reported on 07/09/2024)     hydrocortisone  2.5 % ointment Apply topically 2 (two) times daily. (Patient not taking: Reported on 07/09/2024) 30 g 0   lidocaine  (XYLOCAINE ) 2 % jelly Apply 1 Application topically as needed. (Patient not taking: Reported on 07/09/2024) 20 mL 0   linaclotide  (LINZESS ) 72 MCG capsule Take 1 capsule (72 mcg total) by mouth daily before breakfast. (Patient not taking: Reported on 07/09/2024) 30 capsule 2   triamcinolone  ointment (KENALOG ) 0.5 % Apply 1 Application topically 4 (four) times daily. (Patient not taking: Reported on 07/09/2024) 120 g 1   No current facility-administered medications for this encounter.     REVIEW OF SYSTEMS: On review of systems, the patient reports that she is doing okay. She has found pain medication to be helpful with her anal pain. She continues to have bleeding from the tumor some days more than others, but states her bowel movements are regular and she is not constipated. She denies a history of abnormal pap smears. No other complaints are verbalized.     PHYSICAL EXAM:  Unable to assess due to encounter type    ECOG = 1  0 - Asymptomatic (Fully active, able to carry on all predisease activities without restriction)  1 - Symptomatic but completely ambulatory (Restricted in physically strenuous activity but ambulatory  and able to carry out work of a light or sedentary nature. For example, light housework, office work)  2 - Symptomatic, <50% in bed during the day (Ambulatory and capable of all self care but unable to carry out any work activities. Up and about more than 50% of waking hours)  3 - Symptomatic, >50% in bed, but not bedbound (Capable of only limited self-care, confined to bed or chair 50% or more of waking hours)  4 - Bedbound (Completely disabled. Cannot carry on any self-care. Totally confined to bed or chair)  5 - Death   Whitney [name], Creech RH, Tormey DC, et  al. [phone]). Toxicity and response criteria of the [facility]. Am. Whitney [name]. Oncol. 5 (6): 649-55    LABORATORY DATA:  Lab Results  Component Value Date   WBC 16.7 (H) 07/08/2024   HGB 12.1 07/08/2024   HCT 36.2 07/08/2024   MCV 86.2 07/08/2024   PLT 536 (H) 07/08/2024   Lab Results  Component Value Date   NA 139 07/08/2024   K 4.0 07/08/2024   CL 98 07/08/2024   CO2 29 07/08/2024   Lab Results  Component Value Date   ALT 31 07/08/2024   AST 25 07/08/2024   ALKPHOS 121 07/08/2024   BILITOT 0.5 07/08/2024      RADIOGRAPHY: CT ABDOMEN PELVIS W CONTRAST Result Date: 06/28/2024 EXAM: CT ABDOMEN AND PELVIS WITH CONTRAST 06/28/2024 03:44:08 PM TECHNIQUE: CT of the abdomen and pelvis was performed with the administration of 100 mL of iohexol  (OMNIPAQUE ) 300 MG/ML solution. Multiplanar reformatted images are provided for review. Automated exposure control, iterative reconstruction, and/or weight-based adjustment of the mA/kV was utilized to reduce the radiation dose to as low as reasonably achievable. COMPARISON: 01/03/2009 CLINICAL HISTORY: Recent anal cancer diagnosis; diverticulitis. * Tracking Code: BO * FINDINGS: LOWER CHEST: Contrast medium in the distal esophagus suggesting dysmotility or reflux. Lower descending thoracic aortic atherosclerosis. Mild cardiomegaly. LIVER: The liver is unremarkable.  GALLBLADDER AND BILE DUCTS: Cholecystectomy. No biliary ductal dilatation. SPLEEN: No acute abnormality. PANCREAS: No acute abnormality. ADRENAL GLANDS: No acute abnormality. KIDNEYS, URETERS AND BLADDER: No stones in the kidneys or ureters. No hydronephrosis. No perinephric or periureteral stranding. Urinary bladder is unremarkable. GI AND BOWEL: Prominent irregularity and abnormal enhancement along the anal canal likely reflecting the patient's known anal mass, as on image 99 series 7 and image 84 series 9. Possible ulceration along the anal canal with irregular somewhat PADULOUS appearance. The area of irregularity measures about 4.2 x 3.7 x 3.9 cm. Prominence of frothy fluid in the colon. No dilated small bowel. Stomach demonstrates no acute abnormality. There is no bowel obstruction. PERITONEUM AND RETROPERITONEUM: No ascites. No free air. VASCULATURE: Aorta is normal in caliber. Atheromatous plaque dorsally at the origins of the celiac trunk and superior mesenteric artery without high grade stenosis or occlusion. Systemic atherosclerosis is present, including the aorta and iliac arteries. LYMPH NODES: 0.4 cm perirectal lymph node along the left posterior perirectal adipose tissue on image 78 series 2. Left external iliac node 0.8 cm in diameter, generally considered within normal size limits. REPRODUCTIVE ORGANS: No acute abnormality. BONES AND SOFT TISSUES: Lumbar spondylosis and degenerative disc disease with moderate left foraminal impingement suspected at L3-L4 and L4-L5. No acute osseous abnormality. No focal soft tissue abnormality. IMPRESSION: 1. Prominent irregularity and abnormal enhancement along the anal canal consistent with anal malignancy, possible ulceration, measuring approximately 4.2 x 3.7 x 3.9 cm. 2. Small left posterior perirectal lymph node measuring 0.4 cm. 3. Mild prominence of frothy fluid throughout the colon. No current significant diverticulosis or active diverticulitis. 4.  Incidental findings include contrast in the distal esophagus suggesting dysmotility or reflux, atherosclerosis involving the aorta and iliac arteries with plaque at the celiac and superior mesenteric artery origins without high-grade stenosis or occlusion, mild cardiomegaly, status post cholecystectomy, and lumbar spondylosis and degenerative disc disease with suspected moderate left foraminal impingement at L3-4 and L4-5. Electronically signed by: Ryan Salvage MD 06/28/2024 04:30 PM EST RP Workstation: [id number]       IMPRESSION/PLAN: 1. Squamous Cell Carcinoma of the Anus. Dr. Dewey discusses the pathology  findings and reviews the nature of anal carcinoma. We discussed the course of recommended treatment would be chemoradiation. We discussed the PET scan she had yesterday has not been read but would determine if she has 5 1/2 weeks or 6 weeks of treatment provided she does not have any metastatic disease. We discussed the risks, benefits, short, and long term effects of radiotherapy, as well as the curative intent, and the patient is interested in proceeding. Dr. Dewey discusses the delivery and logistics of radiotherapy. She will simulate tomorrow at which time she will sign written consent to proceed.  2. 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 We discussed the importance of evaluation with physical therapy prior to pelvic radiation. A referral was placed to physical therapy today.    This encounter was conducted via telephone.  The patient has provided two factor identification and has given verbal consent for this type of encounter and has been advised to only accept a meeting of this type in a secure network environment. The time spent during this encounter was 60 minutes including preparation, discussion, and coordination of the patient's care. The attendants for this meeting include Sharene Cary, RN, Dr. Dewey, Donald Estefana Husband  and Asberry KATHEE Butler.  During the  encounter,  Sharene Cary, RN, Dr. Dewey, and Donald Estefana Husband were located at [facility] Radiation Oncology Department.  CLORIA CIRESI was located at home.   The above documentation reflects my direct findings during this shared patient visit. Please see the separate note by Dr. Dewey on this date for the remainder of the patient's plan of care.    Donald KYM Husband, [facility]   **Disclaimer: This note was dictated with voice recognition software. Similar sounding words can inadvertently be transcribed and this note may contain transcription errors which may not have been corrected upon publication of note.**

## 2024-07-09 NOTE — Progress Notes (Signed)
 GI Location of Tumor / Histology: Anal cancer  Whitney [name] presented to surgical attention due to chronically inflamed external hemorrhoid causing pain.  On rectal exam she was noted to have a left lateral firm fixed mass.  Biopsy of Anal Mass 06/22/2024   Past/Anticipated interventions by surgeon, if any:    Past/Anticipated interventions by medical oncology, if any:  [place]lam Ned Dr. Cloretta 07/02/2024 -She will undergo a staging PET scan.  -I recommend treatment with concurrent 5-FU/Mitomycin-C and radiation if the PET scan reveals no evidence of metastatic disease.  -Estimated start date for radiation therapy 07/20/2024.    Weight changes, if any: Stable  Bowel/Bladder complaints, if any: Soft bowels  Nausea / Vomiting, if any: None  Pain issues, if any: She reports pain and irritation with bowel movements.  She is taking Oxycodone  q4 PRN, tylenol  extra strength q8  Any blood per rectum: Occasional bleeding with bowel movements, bright red in color.    SAFETY ISSUES: Prior radiation? No Pacemaker/ICD? No Possible current pregnancy? Postmenopausal Is the patient on methotrexate? No  Current Complaints/Details:

## 2024-07-10 ENCOUNTER — Other Ambulatory Visit: Payer: Self-pay

## 2024-07-10 ENCOUNTER — Encounter

## 2024-07-10 ENCOUNTER — Ambulatory Visit
Admission: RE | Admit: 2024-07-10 | Discharge: 2024-07-10 | Disposition: A | Discharge status: Home / Self Care | Source: Ambulatory Visit | Attending: Radiation Oncology | Admitting: Radiation Oncology

## 2024-07-10 DIAGNOSIS — Z510 Encounter for antineoplastic radiation therapy: Secondary | ICD-10-CM | POA: Insufficient documentation

## 2024-07-10 DIAGNOSIS — C210 Malignant neoplasm of anus, unspecified: Secondary | ICD-10-CM | POA: Insufficient documentation

## 2024-07-10 NOTE — Progress Notes (Signed)
 Pharmacist Chemotherapy Monitoring - Initial Assessment    Anticipated start date: 07/20/24   The following has been reviewed per standard work regarding the patient's treatment regimen: The patient's diagnosis, treatment plan and drug doses, and organ/hematologic function Lab orders and baseline tests specific to treatment regimen  The treatment plan start date, drug sequencing, and pre-medications Prior authorization status  Patient's documented medication list, including drug-drug interaction screen and prescriptions for anti-emetics and supportive care specific to the treatment regimen The drug concentrations, fluid compatibility, administration routes, and timing of the medications to be used The patient's access for treatment and lifetime cumulative dose history, if applicable  The patient's medication allergies and previous infusion related reactions, if applicable   Changes made to treatment plan:  N/A  Follow up needed:  N/A   Whitney [name], RPH, 07/10/2024  3:17 PM

## 2024-07-11 ENCOUNTER — Other Ambulatory Visit: Payer: Self-pay

## 2024-07-13 ENCOUNTER — Telehealth: Payer: Self-pay

## 2024-07-13 NOTE — Telephone Encounter (Signed)
 [facility] Clinical Social Work  Clinical Social Work was referred by nurse for assessment of psychosocial needs.  Clinical Social Worker contacted patient by phone to offer support and assess for needs. Patient was sleeping at time of call. Patient's son lives with her, no SDOH concerns, Patient will be transported by son / friends to treatment, patient is of [place] faith, and denied any other concerns,   Lizbeth Sprague, LCSW  Clinical Social Worker [facility]

## 2024-07-14 ENCOUNTER — Other Ambulatory Visit: Payer: Self-pay

## 2024-07-15 DIAGNOSIS — Z510 Encounter for antineoplastic radiation therapy: Secondary | ICD-10-CM | POA: Diagnosis not present

## 2024-07-17 ENCOUNTER — Telehealth: Payer: Self-pay

## 2024-07-17 ENCOUNTER — Other Ambulatory Visit: Payer: Self-pay | Admitting: Nurse Practitioner

## 2024-07-17 ENCOUNTER — Ambulatory Visit (HOSPITAL_COMMUNITY)
Admission: RE | Admit: 2024-07-17 | Discharge: 2024-07-17 | Disposition: A | Discharge status: Home / Self Care | Source: Ambulatory Visit | Attending: Nurse Practitioner | Admitting: Nurse Practitioner

## 2024-07-17 DIAGNOSIS — C210 Malignant neoplasm of anus, unspecified: Secondary | ICD-10-CM

## 2024-07-17 MED ORDER — LIDOCAINE HCL 1 % IJ SOLN
20.0000 mL | Freq: Once | INTRAMUSCULAR | Status: AC
  Administered 2024-07-17: 10 mL via INTRADERMAL

## 2024-07-17 MED ORDER — HEPARIN SOD (PORK) LOCK FLUSH 100 UNIT/ML IV SOLN
INTRAVENOUS | Status: AC
  Filled 2024-07-17: qty 5

## 2024-07-17 MED ORDER — HEPARIN SOD (PORK) LOCK FLUSH 100 UNIT/ML IV SOLN
500.0000 [IU] | Freq: Once | INTRAVENOUS | Status: AC
  Administered 2024-07-17: 500 [IU] via INTRAVENOUS

## 2024-07-17 MED ORDER — LIDOCAINE HCL 1 % IJ SOLN
INTRAMUSCULAR | Status: AC
  Filled 2024-07-17: qty 20

## 2024-07-17 NOTE — Telephone Encounter (Signed)
 Notified the pt (son) Whitney [name] regarding his FMLA forms being completed and ready for pick up. Whitney [name] verbalized understanding and stated that he would pick up today 07/17/2024.

## 2024-07-17 NOTE — Procedures (Signed)
 Interventional Radiology Procedure Note  Procedure: PICC placement  Complications: None  Estimated Blood Loss: < 10 mL  Findings: Basilic vein DL PICC placement.  Catheter tip at cavoatrial junction.  Cordella DELENA Banner, MD

## 2024-07-19 ENCOUNTER — Encounter (HOSPITAL_BASED_OUTPATIENT_CLINIC_OR_DEPARTMENT_OTHER): Payer: Self-pay | Admitting: Emergency Medicine

## 2024-07-19 ENCOUNTER — Emergency Department (HOSPITAL_BASED_OUTPATIENT_CLINIC_OR_DEPARTMENT_OTHER)
Admission: EM | Admit: 2024-07-19 | Discharge: 2024-07-19 | Disposition: A | Discharge status: Home / Self Care | Attending: Emergency Medicine | Admitting: Emergency Medicine

## 2024-07-19 ENCOUNTER — Other Ambulatory Visit: Payer: Self-pay | Admitting: Oncology

## 2024-07-19 ENCOUNTER — Encounter: Payer: Self-pay | Admitting: Oncology

## 2024-07-19 DIAGNOSIS — G893 Neoplasm related pain (acute) (chronic): Secondary | ICD-10-CM | POA: Insufficient documentation

## 2024-07-19 DIAGNOSIS — R000 Tachycardia, unspecified: Secondary | ICD-10-CM | POA: Diagnosis not present

## 2024-07-19 DIAGNOSIS — Z7982 Long term (current) use of aspirin: Secondary | ICD-10-CM | POA: Diagnosis not present

## 2024-07-19 DIAGNOSIS — Z79899 Other long term (current) drug therapy: Secondary | ICD-10-CM | POA: Diagnosis not present

## 2024-07-19 DIAGNOSIS — Z85048 Personal history of other malignant neoplasm of rectum, rectosigmoid junction, and anus: Secondary | ICD-10-CM | POA: Insufficient documentation

## 2024-07-19 MED ORDER — OXYCODONE HCL 5 MG PO TABS
5.0000 mg | ORAL_TABLET | Freq: Once | ORAL | Status: AC
  Administered 2024-07-19: 5 mg via ORAL
  Filled 2024-07-19: qty 1

## 2024-07-19 MED ORDER — OXYCODONE HCL 5 MG PO TABS: 5.0000 mg | ORAL_TABLET | ORAL | 0 refills | Status: DC | PRN

## 2024-07-19 NOTE — Discharge Instructions (Addendum)
 Whitney [name]  Thank you for allowing us  to take care of you today.  You came to the Emergency Department today because you ran out of your pain medication and you had cancer related pain.  You also want us  to look at your PICC line.  You have a small amount of old blood under your PICC line, however there is no sign that it looks dislodged or infected.  You should have it checked with your oncology team tomorrow before they use it, however there is no concerning findings on my exam today that makes me want to have it exchanged or pulled today.  We will prescribe you 2 days of your pain medication, and you can follow-up with your oncologist tomorrow to further discuss your pain regimen.   To-Do: 1. Please follow-up with your primary doctor within 1 - 2 weeks / as soon as possible.   Please return to the Emergency Department or call 911 if you experience have worsening of your symptoms, or do not get better, chest pain, shortness of breath, severe or significantly worsening pain, high fever, severe confusion, pass out or have any reason to think that you need emergency medical care.   We hope you feel better soon.   Mitzie Later, MD Department of Emergency Medicine [facility]

## 2024-07-19 NOTE — ED Provider Notes (Signed)
 " [place] [facility] AT [facility] Provider Note   CSN: [id number] Arrival date & time: 07/19/24  1053     History Chief Complaint  Patient presents with   Pain    HPI: Whitney [name] is a 83 y.o. female with history pertinent for anal cancer who presents complaining of uncontrolled pain and concern regarding PICC line. Patient arrived via POV accompanied by son.  History provided by patient and relative: Son.  No interpreter required during this encounter.  Patient and son report that she was recently diagnosed with anal cancer, is supposed to start chemo and radiation tomorrow.  She is unable to sit due to pain.  Is typically on 5 mg oxycodone  every 4 hours as needed.  Reports that she has run out of this medication, and is experiencing her typical pain.  Reports that she has an appointment tomorrow, however does not feel that she will be able to tolerate the pain until tomorrow, also reports that they tried to call her oncologist, however did not get a callback, therefore came to the emergency department for further evaluation.  With regard to PICC line, patient had PICC line placed on Friday, feels like it is having a tugging sensation, and is concerned that the needle is misplaced, also expresses concern for possible discoloration  Patient's recorded medical, surgical, social, medication list and allergies were reviewed in the Snapshot window as part of the initial history.   Prior to Admission medications  Medication Sig Start Date End Date Taking? Authorizing Provider  oxyCODONE  (ROXICODONE ) 5 MG immediate release tablet Take 1 tablet (5 mg total) by mouth every 4 (four) hours as needed for up to 2 days for severe pain (pain score 7-10). 07/19/24 07/21/24 Yes Rogelia Jerilynn RAMAN, MD  Accu-Chek FastClix Lancets MISC Use to check blood sugars twice a day as needed 01/22/23   Plotnikov, Aleksei V, MD  ACCU-CHEK GUIDE TEST test strip USE  STRIP TO CHECK GLUCOSE  TWICE DAILY AS NEEDED 05/08/24   Plotnikov, Aleksei V, MD  Accu-Chek Softclix Lancets lancets USE AS DIRECTED TO  CHECK  BLOOD  SUGAR 06/20/24   Plotnikov, Karlynn GAILS, MD  aspirin  81 MG tablet Take 81 mg by mouth daily.    [provider]  Blood Glucose Monitoring Suppl (ACCU-CHEK GUIDE) w/Device KIT Use to check blood sugars everyday 01/22/23   Plotnikov, Aleksei V, MD  Cholecalciferol (VITAMIN D3) 50 MCG (2000 UT) capsule Take 1 capsule (2,000 Units total) by mouth daily. 07/25/19   Plotnikov, Aleksei V, MD  ciprofloxacin  (CIPRO ) 500 MG tablet Take 1 tablet (500 mg total) by mouth 2 (two) times daily. Patient not taking: Reported on 07/09/2024 06/02/24   Plotnikov, Aleksei V, MD  Cyanocobalamin  (VITAMIN B-12) 1000 MCG SUBL Place 1 tablet (1,000 mcg total) under the tongue daily. 04/16/11   Plotnikov, Aleksei V, MD  Docusate Sodium (COLACE PO) Take by mouth daily before breakfast. Patient not taking: Reported on 07/09/2024    [provider]  hydrocortisone  2.5 % ointment Apply topically 2 (two) times daily. Patient not taking: Reported on 07/09/2024 04/10/24   Geofm Glade PARAS, MD  lidocaine  (XYLOCAINE ) 2 % jelly Apply 1 Application topically as needed. Patient not taking: Reported on 07/09/2024 06/08/24   Merlynn Niki FALCON, FNP  linaclotide  (LINZESS ) 72 MCG capsule Take 1 capsule (72 mcg total) by mouth daily before breakfast. Patient not taking: Reported on 07/09/2024 06/08/24   Merlynn Niki FALCON, FNP  MegaRed Omega-3 Krill Oil 500 MG  CAPS Take 1 capsule by mouth every morning. Patient taking differently: Take 1 capsule by mouth every morning. Taking 2 capsules PO daily 12/11/22   Plotnikov, Aleksei V, MD  Multiple Vitamins-Minerals (WOMENS MULTI GUMMIES PO) Take by mouth.    [provider]  oxyCODONE  (ROXICODONE ) 5 MG immediate release tablet Take 1 tablet (5 mg total) by mouth every 4 (four) hours as needed for severe pain (pain score 7-10). 07/02/24   Thomas, Lisa K, NP   potassium chloride  SA (KLOR-CON  M) 20 MEQ tablet Take 1 tablet (20 mEq total) by mouth daily. 07/06/24   Thomas, Lisa K, NP  rosuvastatin  (CRESTOR ) 5 MG tablet Take 1 tablet by mouth once daily 02/24/24   Plotnikov, Aleksei V, MD  SYNTHROID  125 MCG tablet Take 1 tablet by mouth once daily 12/23/23   Plotnikov, Aleksei V, MD  triamcinolone  ointment (KENALOG ) 0.5 % Apply 1 Application topically 4 (four) times daily. Patient not taking: Reported on 07/09/2024 06/02/24 06/02/25  Plotnikov, Karlynn GAILS, MD     Allergies: Benzocaine-menthol and Penicillins   Review of Systems   ROS as per HPI  Physical Exam Updated Vital Signs BP 135/60 (BP Location: Right Arm)   Pulse 84   Temp (!) 97.5 F (36.4 C) (Oral)   Resp 18   SpO2 93%  Physical Exam Vitals and nursing note reviewed.  Constitutional:      General: She is not in acute distress.    Appearance: She is well-developed.  HENT:     Head: Normocephalic and atraumatic.  Eyes:     Conjunctiva/sclera: Conjunctivae normal.  Cardiovascular:     Rate and Rhythm: Normal rate and regular rhythm.  Pulmonary:     Effort: Pulmonary effort is normal.     Breath sounds: Normal breath sounds.  Abdominal:     Palpations: Abdomen is soft.     Tenderness: There is no abdominal tenderness.  Musculoskeletal:        General: No swelling.     Cervical back: Neck supple.     Comments: PICC line to right upper arm with small amount of old blood under dressing, no erythema, no dislodgment  Skin:    General: Skin is warm and dry.     Capillary Refill: Capillary refill takes less than 2 seconds.  Neurological:     Mental Status: She is alert.  Psychiatric:        Mood and Affect: Mood normal.     ED Course/ Medical Decision Making/ A&P    Procedures Procedures   Medications Ordered in ED Medications  oxyCODONE  (Oxy IR/ROXICODONE ) immediate release tablet 5 mg (5 mg Oral Given 07/19/24 1237)    Medical Decision Making:   NYRIE SIGAL is a 83 y.o. female who presents for pain and PICC line concern as per above.  Physical exam is pertinent for small amount of old blood under PICC line, no erythema, induration, tenderness.   The differential includes but is not limited to PICC line complication.  Independent historian: Relative: Son  External data reviewed: Reviewed PDMP, patient most recently had a 10-day course of medication filled on 12/17 which would have run out yesterday, 12/27 consistent with patient's reported history.  Labs: Not indicated  Radiology: Not indicated  EKG/Medicine tests: Not indicated EKG Interpretation:     Interventions: Oxycodone   See the EMR for full details regarding lab and imaging results.  Given patient has cancer related pain, do feel that it is reasonable to prescribe her a  short course of oxycodone  to bridge her to her follow-up tomorrow, will prescribe 2 days of her home oxycodone , oxycodone  administered in the emergency department with improvement.  With current PICC line no evidence of erythema, dislodgment.  Patient does not have chest pain or shortness of breath, is concerned that the skin is tugging around the PICC line, discussed that there is no needle left in that area, rather it is a very long IV.  Patient feels reassured, discussed that she should further have it checked by her oncology team tomorrow.  Patient was initially mildly tachycardic while in the ED, feel that this is pain mediated given resolved after administration of oxycodone .  Overall feel that patient is stable for discharge and outpatient follow-up, patient and son at bedside comfortable with this plan..  Presentation is most consistent with exacerbation of chronic illness  Discussion of management or test interpretations with external provider(s): Not indicated  Risk Drugs:Prescription drug management  Disposition: DISCHARGE: I believe that the patient is safe for discharge home with  outpatient follow-up. Patient was informed of all pertinent physical exam, laboratory, and imaging findings.  Patient's suspected etiology of their symptom presentation was discussed with the patient and all questions were answered. We discussed following up with PCP, oncologist. I provided thorough ED return precautions. The patient feels safe and comfortable with this plan.  MDM generated using voice dictation software and may contain dictation errors.  Please contact me for any clarification or with any questions.  Clinical Impression:  1. Cancer related pain      Discharge   Final Clinical Impression(s) / ED Diagnoses Final diagnoses:  Cancer related pain    Rx / DC Orders ED Discharge Orders          Ordered    oxyCODONE  (ROXICODONE ) 5 MG immediate release tablet  Every 4 hours PRN        07/19/24 1329             Rogelia Jerilynn RAMAN, MD 07/19/24 1330  "

## 2024-07-19 NOTE — ED Notes (Signed)
 DC paperwork given and verbally understood.

## 2024-07-19 NOTE — ED Triage Notes (Signed)
 Rectal pain with anal cancer Ran out of pain meds Also want eval of new picc line Unable to sit

## 2024-07-20 ENCOUNTER — Telehealth: Payer: Self-pay | Admitting: *Deleted

## 2024-07-20 ENCOUNTER — Inpatient Hospital Stay: Admitting: Nutrition

## 2024-07-20 ENCOUNTER — Inpatient Hospital Stay

## 2024-07-20 ENCOUNTER — Ambulatory Visit
Admission: RE | Admit: 2024-07-20 | Discharge: 2024-07-20 | Disposition: A | Discharge status: Home / Self Care | Source: Ambulatory Visit | Attending: Radiation Oncology | Admitting: Radiation Oncology

## 2024-07-20 ENCOUNTER — Inpatient Hospital Stay: Admitting: Nurse Practitioner

## 2024-07-20 ENCOUNTER — Encounter: Payer: Self-pay | Admitting: Nurse Practitioner

## 2024-07-20 ENCOUNTER — Telehealth: Payer: Self-pay | Admitting: Nurse Practitioner

## 2024-07-20 ENCOUNTER — Other Ambulatory Visit: Payer: Self-pay

## 2024-07-20 VITALS — BP 142/58 | HR 80 | Temp 97.8°F | Resp 18 | Ht 61.0 in | Wt 121.7 lb

## 2024-07-20 DIAGNOSIS — C210 Malignant neoplasm of anus, unspecified: Secondary | ICD-10-CM

## 2024-07-20 DIAGNOSIS — Z95828 Presence of other vascular implants and grafts: Secondary | ICD-10-CM

## 2024-07-20 DIAGNOSIS — Z510 Encounter for antineoplastic radiation therapy: Secondary | ICD-10-CM | POA: Diagnosis not present

## 2024-07-20 LAB — RAD ONC ARIA SESSION SUMMARY
Course Elapsed Days: 0
Plan Fractions Treated to Date: 1
Plan Prescribed Dose Per Fraction: 1.8 Gy
Plan Total Fractions Prescribed: 30
Plan Total Prescribed Dose: 54 Gy
Reference Point Dosage Given to Date: 1.8 Gy
Reference Point Session Dosage Given: 1.8 Gy
Session Number: 1

## 2024-07-20 LAB — CMP (CANCER CENTER ONLY)
ALT: 20 U/L (ref 0–44)
AST: 21 U/L (ref 15–41)
Albumin: 3.8 g/dL (ref 3.5–5.0)
Alkaline Phosphatase: 144 U/L — ABNORMAL HIGH (ref 38–126)
Anion gap: 10 (ref 5–15)
BUN: 13 mg/dL (ref 8–23)
CO2: 28 mmol/L (ref 22–32)
Calcium: 10 mg/dL (ref 8.9–10.3)
Chloride: 99 mmol/L (ref 98–111)
Creatinine: 0.82 mg/dL (ref 0.44–1.00)
GFR, Estimated: >60 mL/min
Glucose, Bld: 142 mg/dL — ABNORMAL HIGH (ref 70–99)
Potassium: 4.1 mmol/L (ref 3.5–5.1)
Sodium: 137 mmol/L (ref 135–145)
Total Bilirubin: 0.4 mg/dL (ref 0.0–1.2)
Total Protein: 7.7 g/dL (ref 6.5–8.1)

## 2024-07-20 LAB — CBC WITH DIFFERENTIAL (CANCER CENTER ONLY)
Abs Immature Granulocytes: 0.07 K/uL (ref 0.00–0.07)
Basophils Absolute: 0.1 K/uL (ref 0.0–0.1)
Basophils Relative: 1 %
Eosinophils Absolute: 0.2 K/uL (ref 0.0–0.5)
Eosinophils Relative: 1 %
HCT: 33.2 % — ABNORMAL LOW (ref 36.0–46.0)
Hemoglobin: 11.1 g/dL — ABNORMAL LOW (ref 12.0–15.0)
Immature Granulocytes: 0 %
Lymphocytes Relative: 12 %
Lymphs Abs: 2 K/uL (ref 0.7–4.0)
MCH: 28.8 pg (ref 26.0–34.0)
MCHC: 33.4 g/dL (ref 30.0–36.0)
MCV: 86.2 fL (ref 80.0–100.0)
Monocytes Absolute: 1.5 K/uL — ABNORMAL HIGH (ref 0.1–1.0)
Monocytes Relative: 9 %
Neutro Abs: 12.8 K/uL — ABNORMAL HIGH (ref 1.7–7.7)
Neutrophils Relative %: 77 %
Platelet Count: 501 K/uL — ABNORMAL HIGH (ref 150–400)
RBC: 3.85 MIL/uL — ABNORMAL LOW (ref 3.87–5.11)
RDW: 14.5 % (ref 11.5–15.5)
WBC Count: 16.7 K/uL — ABNORMAL HIGH (ref 4.0–10.5)
nRBC: 0 % (ref 0.0–0.2)

## 2024-07-20 MED ORDER — SODIUM CHLORIDE 0.9 % IV SOLN
1000.0000 mg/m2/d | INTRAVENOUS | Status: DC
  Administered 2024-07-20: 6150 mg via INTRAVENOUS
  Filled 2024-07-20: qty 100

## 2024-07-20 MED ORDER — SODIUM CHLORIDE 0.9 % IV SOLN: INTRAVENOUS | Status: DC

## 2024-07-20 MED ORDER — PROCHLORPERAZINE MALEATE 10 MG PO TABS
10.0000 mg | ORAL_TABLET | Freq: Once | ORAL | Status: AC
  Administered 2024-07-20: 10 mg via ORAL
  Filled 2024-07-20: qty 1

## 2024-07-20 MED ORDER — OXYCODONE HCL 5 MG PO TABS: 5.0000 mg | ORAL_TABLET | ORAL | 0 refills | Status: DC | PRN

## 2024-07-20 MED ORDER — MITOMYCIN CHEMO IV INJECTION 20 MG
10.0000 mg/m2 | Freq: Once | INTRAVENOUS | Status: AC
  Administered 2024-07-20: 15.5 mg via INTRAVENOUS
  Filled 2024-07-20: qty 31

## 2024-07-20 NOTE — Patient Instructions (Addendum)
 [facility] - A DEPT OF [facility]  Discharge Instructions: Thank you for choosing [facility] to provide your oncology and hematology care.   If you have a lab appointment with the Cancer Center, please go directly to the Cancer Center and check in at the registration area.   Wear comfortable clothing and clothing appropriate for easy access to any Portacath or PICC line.   We strive to give you quality time with your provider. You may need to reschedule your appointment if you arrive late (15 or more minutes).  Arriving late affects you and other patients whose appointments are after yours.  Also, if you miss three or more appointments without notifying the office, you may be dismissed from the clinic at the providers discretion.      For prescription refill requests, have your pharmacy contact our office and allow 72 hours for refills to be completed.    Today you received the following chemotherapy and/or immunotherapy agents: Mitomycin  (mutamycin ) and Fluorouracil  (adrucil , 5-FU)      To help prevent nausea and vomiting after your treatment, we encourage you to take your nausea medication as directed.  BELOW ARE SYMPTOMS THAT SHOULD BE REPORTED IMMEDIATELY: *FEVER GREATER THAN 100.4 F (38 C) OR HIGHER *CHILLS OR SWEATING *NAUSEA AND VOMITING THAT IS NOT CONTROLLED WITH YOUR NAUSEA MEDICATION *UNUSUAL SHORTNESS OF BREATH *UNUSUAL BRUISING OR BLEEDING *URINARY PROBLEMS (pain or burning when urinating, or frequent urination) *BOWEL PROBLEMS (unusual diarrhea, constipation, pain near the anus) TENDERNESS IN MOUTH AND THROAT WITH OR WITHOUT PRESENCE OF ULCERS (sore throat, sores in mouth, or a toothache) UNUSUAL RASH, SWELLING OR PAIN  UNUSUAL VAGINAL DISCHARGE OR ITCHING   Items with * indicate a potential emergency and should be followed up as soon as possible or go to the Emergency Department if any problems should occur.  Please show  the CHEMOTHERAPY ALERT CARD or IMMUNOTHERAPY ALERT CARD at check-in to the Emergency Department and triage nurse.  Should you have questions after your visit or need to cancel or reschedule your appointment, please contact [facility] - A DEPT OF [facility]  Dept: [phone]  and follow the prompts.  Office hours are 8:00 a.m. to 4:30 p.m. Monday - Friday. Please note that voicemails left after 4:00 p.m. may not be returned until the following business day.  We are closed weekends and major holidays. You have access to a nurse at all times for urgent questions. Please call the main number to the clinic Dept: [phone] and follow the prompts.   For any non-urgent questions, you may also contact your provider using MyChart. We now offer e-Visits for anyone 51 and older to request care online for non-urgent symptoms. For details visit [email or web address].   Also download the MyChart app! Go to the app store, search MyChart, open the app, select [facility], and log in with your MyChart username and password.  Mitomycin  Injection What is this medication? MITOMYCIN  (mye toe MYE sin) treats stomach cancer and pancreatic cancer. It works by slowing down the growth of cancer cells. This medicine may be used for other purposes; ask your health care provider or pharmacist if you have questions. COMMON BRAND NAME(S): Mutamycin  What should I tell my care team before I take this medication? They need to know if you have any of these conditions: Bleeding disorders Infection, such as chickenpox, cold sores, herpes Low blood counts, such as low white cells, platelets,  red blood cells Kidney disease An unusual or allergic reaction to mitomycin , other medications, foods, dyes, or preservatives Pregnant or trying to get pregnant Breastfeeding How should I use this medication? This medication is injected into a vein. It is given by your care team in a hospital or  clinic setting. Talk to your care team about the use of this medication in children. Special care may be needed. Overdosage: If you think you have taken too much of this medicine contact a poison control center or emergency room at once. NOTE: This medicine is only for you. Do not share this medicine with others. What if I miss a dose? Keep appointments for follow-up doses. It is important not to miss your dose. Call your care team if you are unable to keep an appointment. What may interact with this medication? Interactions are not expected. This list may not describe all possible interactions. Give your health care provider a list of all the medicines, herbs, non-prescription drugs, or dietary supplements you use. Also tell them if you smoke, drink alcohol, or use illegal drugs. Some items may interact with your medicine. What should I watch for while using this medication? Your condition will be monitored carefully while you are receiving this medication. You may need blood work while taking this medication. This medication may make you feel generally unwell. This is not uncommon as chemotherapy can affect healthy cells as well as cancer cells. Report any side effects. Continue your course of treatment even though you feel ill unless your care team tells you to stop. This medication may increase your risk of getting an infection. Call your care team for advice if you get a fever, chills, sore throat, or other symptoms of a cold or flu. Do not treat yourself. Try to avoid being around people who are sick. Avoid taking medications that contain aspirin , acetaminophen , ibuprofen, naproxen, or ketoprofen unless instructed by your care team. These medications may hide a fever. This medication may increase your risk to bruise or bleed. Call your care team if you notice any unusual bleeding. Be careful brushing or flossing your teeth or using a toothpick because you may get an infection or bleed more  easily. If you have any dental work done, tell your dentist you are receiving this medication. Talk to your care team if you may be pregnant. Serious birth defects can occur if you take this medication during pregnancy. Contraception is recommended while taking this medication. Your care team can help you find the option that works for you. Do not breastfeed while taking this medication. What side effects may I notice from receiving this medication? Side effects that you should report to your care team as soon as possible: Allergic reactions--skin rash, itching, hives, swelling of the face, lips, tongue, or throat Dry cough, shortness of breath or trouble breathing Infection--fever, chills, cough, sore throat, wounds that don't heal, pain or trouble when passing urine, general feeling of discomfort or being unwell Kidney injury--decrease in the amount of urine, swelling of the ankles, hands, or feet Low red blood cell level--unusual weakness or fatigue, dizziness, headache, trouble breathing Stomach pain, bloody diarrhea, pale skin, unusual weakness or fatigue, decrease in the amount of urine, which may be signs of hemolytic uremic syndrome Unusual bruising or bleeding Side effects that usually do not require medical attention (report these to your care team if they continue or are bothersome): Diarrhea Hair loss Loss of appetite with weight loss Nausea Pain, redness, or swelling with sores  inside the mouth or throat This list may not describe all possible side effects. Call your doctor for medical advice about side effects. You may report side effects to FDA at 1-800-FDA-1088. Where should I keep my medication? This medication is given in a hospital or clinic. It will not be stored at home. NOTE: This sheet is a summary. It may not cover all possible information. If you have questions about this medicine, talk to your doctor, pharmacist, or health care provider.  2024 Elsevier/Gold Standard  (2021-11-30 00:00:00)  Fluorouracil  Injection What is this medication? FLUOROURACIL  (flure oh YOOR a sil) treats some types of cancer. It works by slowing down the growth of cancer cells. This medicine may be used for other purposes; ask your health care provider or pharmacist if you have questions. COMMON BRAND NAME(S): Adrucil  What should I tell my care team before I take this medication? They need to know if you have any of these conditions: Blood disorders Dihydropyrimidine dehydrogenase (DPD) deficiency Infection, such as chickenpox, cold sores, herpes Kidney disease Liver disease Poor nutrition Recent or ongoing radiation therapy An unusual or allergic reaction to fluorouracil , other medications, foods, dyes, or preservatives If you or your partner are pregnant or trying to get pregnant Breast-feeding How should I use this medication? This medication is injected into a vein. It is administered by your care team in a hospital or clinic setting. Talk to your care team about the use of this medication in children. Special care may be needed. Overdosage: If you think you have taken too much of this medicine contact a poison control center or emergency room at once. NOTE: This medicine is only for you. Do not share this medicine with others. What if I miss a dose? Keep appointments for follow-up doses. It is important not to miss your dose. Call your care team if you are unable to keep an appointment. What may interact with this medication? Do not take this medication with any of the following: Live virus vaccines This medication may also interact with the following: Medications that treat or prevent blood clots, such as warfarin, enoxaparin, dalteparin This list may not describe all possible interactions. Give your health care provider a list of all the medicines, herbs, non-prescription drugs, or dietary supplements you use. Also tell them if you smoke, drink alcohol, or use illegal  drugs. Some items may interact with your medicine. What should I watch for while using this medication? Your condition will be monitored carefully while you are receiving this medication. This medication may make you feel generally unwell. This is not uncommon as chemotherapy can affect healthy cells as well as cancer cells. Report any side effects. Continue your course of treatment even though you feel ill unless your care team tells you to stop. In some cases, you may be given additional medications to help with side effects. Follow all directions for their use. This medication may increase your risk of getting an infection. Call your care team for advice if you get a fever, chills, sore throat, or other symptoms of a cold or flu. Do not treat yourself. Try to avoid being around people who are sick. This medication may increase your risk to bruise or bleed. Call your care team if you notice any unusual bleeding. Be careful brushing or flossing your teeth or using a toothpick because you may get an infection or bleed more easily. If you have any dental work done, tell your dentist you are receiving this medication. Avoid taking  medications that contain aspirin , acetaminophen , ibuprofen, naproxen, or ketoprofen unless instructed by your care team. These medications may hide a fever. Do not treat diarrhea with over the counter products. Contact your care team if you have diarrhea that lasts more than 2 days or if it is severe and watery. This medication can make you more sensitive to the sun. Keep out of the sun. If you cannot avoid being in the sun, wear protective clothing and sunscreen. Do not use sun lamps, tanning beds, or tanning booths. Talk to your care team if you or your partner wish to become pregnant or think you might be pregnant. This medication can cause serious birth defects if taken during pregnancy and for 3 months after the last dose. A reliable form of contraception is recommended while  taking this medication and for 3 months after the last dose. Talk to your care team about effective forms of contraception. Do not father a child while taking this medication and for 3 months after the last dose. Use a condom while having sex during this time period. Do not breastfeed while taking this medication. This medication may cause infertility. Talk to your care team if you are concerned about your fertility. What side effects may I notice from receiving this medication? Side effects that you should report to your care team as soon as possible: Allergic reactions--skin rash, itching, hives, swelling of the face, lips, tongue, or throat Heart attack--pain or tightness in the chest, shoulders, arms, or jaw, nausea, shortness of breath, cold or clammy skin, feeling faint or lightheaded Heart failure--shortness of breath, swelling of the ankles, feet, or hands, sudden weight gain, unusual weakness or fatigue Heart rhythm changes--fast or irregular heartbeat, dizziness, feeling faint or lightheaded, chest pain, trouble breathing High ammonia level--unusual weakness or fatigue, confusion, loss of appetite, nausea, vomiting, seizures Infection--fever, chills, cough, sore throat, wounds that don't heal, pain or trouble when passing urine, general feeling of discomfort or being unwell Low red blood cell level--unusual weakness or fatigue, dizziness, headache, trouble breathing Pain, tingling, or numbness in the hands or feet, muscle weakness, change in vision, confusion or trouble speaking, loss of balance or coordination, trouble walking, seizures Redness, swelling, and blistering of the skin over hands and feet Severe or prolonged diarrhea Unusual bruising or bleeding Side effects that usually do not require medical attention (report to your care team if they continue or are bothersome): Dry skin Headache Increased tears Nausea Pain, redness, or swelling with sores inside the mouth or  throat Sensitivity to light Vomiting This list may not describe all possible side effects. Call your doctor for medical advice about side effects. You may report side effects to FDA at 1-800-FDA-1088. Where should I keep my medication? This medication is given in a hospital or clinic. It will not be stored at home. NOTE: This sheet is a summary. It may not cover all possible information. If you have questions about this medicine, talk to your doctor, pharmacist, or health care provider.  2024 Elsevier/Gold Standard (2021-11-14 00:00:00)

## 2024-07-20 NOTE — Progress Notes (Unsigned)
 PATIENT NAVIGATOR PROGRESS NOTE  Name: Whitney [name] Date: 07/20/2024 MRN: [id number]  DOB: 09/01/1940   Reason for visit:  Patient education session  Comments:  Please see education note    Time spent counseling/coordinating care: > 60 minutes

## 2024-07-20 NOTE — Telephone Encounter (Signed)
 Returned patient call asking if she needs to fast for her treatments today. Confirmed with her OK to eat/drink as usual. Noted she left VM on 12/26 requesting refill on oxycodone  5 mg-informed her we were closed on 12/26. The did go to emergency room and were able to obtain #12 tablets. Will move her 12:00 lab/flush to 1 pm since she has RT at 12:15 today. Requested change w/scheduler.

## 2024-07-20 NOTE — Progress Notes (Signed)
 Patient seen by Dr. Arley Hof today  Vitals are within treatment parameters:Yes   Labs are within treatment parameters: Yes   Treatment plan has been signed: Yes   Per physician team, Patient is ready for treatment. Please note the following modifications: Dose adjustment based on weight loss. Ready to treat

## 2024-07-20 NOTE — Progress Notes (Signed)
" °  [facility] OFFICE PROGRESS NOTE   Diagnosis: Anal cancer  INTERVAL HISTORY:   Ms. Whitney [name] returns as scheduled.  She began radiation earlier today.  She is seen prior to proceeding with cycle one 5-FU/Mitomycin -C.  She continues to have significant rectal pain.  She is taking oxycodone  with partial relief.  She is incontinent of liquid stool.  Appetite is poor and she is losing weight.  Objective:  Vital signs in last 24 hours:  Blood pressure (!) 142/58, pulse 80, temperature 97.8 F (36.6 C), temperature source Temporal, resp. rate 18, height 5' 1 (1.549 m), weight 121 lb 11.2 oz (55.2 kg), SpO2 93%.     Resp: Lungs clear bilaterally. Cardio: Regular rate and rhythm. GI: No hepatosplenomegaly. Vascular: No leg edema. Right upper extremity PICC  Lab Results:  Lab Results  Component Value Date   WBC 16.7 (H) 07/20/2024   HGB 11.1 (L) 07/20/2024   HCT 33.2 (L) 07/20/2024   MCV 86.2 07/20/2024   PLT 501 (H) 07/20/2024   NEUTROABS 12.8 (H) 07/20/2024    Imaging:  No results found.  Medications: I have reviewed the patient's current medications.  Assessment/Plan: Anal cancer Rectal exam by Dr. Debby 06/23/2024-left lateral firm fixed mass, biopsy showed well-differentiated squamous cell carcinoma CTs 06/28/2024-prominent irregularity and abnormal enhancement along the anal canal; possible ulceration along the anal canal with irregular somewhat patulous appearance.  The area of irregularity measured about 4.2 x 3.7 x 3.9 cm.  A 0.4 cm perirectal lymph node noted along the left posterior perirectal adipose tissue; left external iliac node 0.8 cm. PET scan 07/08/2024-hypermetabolic distal anal primary with direct extension and involvement of the left gluteal crease.  Left external iliac and inguinal small but hypermetabolic nodes suspicious for metastatic disease.  Mild hypermetabolism involving the L1 vertebral body without CT correlate. Radiation  07/20/2024 Cycle one 5-FU/Mitomycin -C 07/20/2024 Rectal pain and bleeding secondary to #1 History of colon cancer status post right colectomy 05/06/2007, T3 N0 Hypothyroid Prediabetes B12 deficiency  Disposition: Whitney [name] has anal cancer.  She began radiation earlier today.  Plan to proceed with cycle one 5-FU/Mitomycin -C as scheduled.  CBC and chemistry panel reviewed.  Labs are adequate for treatment.  We reviewed the PET scan report/images with her and her son at today's visit.  There is nonspecific mild hypermetabolism at L1, no CT correlate.  She will return for lab and follow-up on 07/31/2024.  We are available to see her sooner if needed.  Patient seen with Dr. Cloretta.  Olam Debby ANP/GNP-BC   07/20/2024  3:40 PM  This was a shared visit with Olam Debby.  We reviewed the PET findings and images with [name] and her son.  The PET scan reveals evidence of an anal primary, metastatic pelvic lymph nodes, and an indeterminate area of hypermetabolism at L1.  The plan is to begin concurrent chemotherapy and radiation today.  The chemotherapy was dose adjusted for weight loss.  She will return for an office visit and nadir CBC next week.  Arvella Cloretta, MD      "

## 2024-07-20 NOTE — Telephone Encounter (Signed)
 Patient has been scheduled for follow-up visit per 07/20/2024 LOS.  LVM notifying pt of appt details, provided my direct number to pt if appt changes need to be made.

## 2024-07-20 NOTE — Progress Notes (Signed)
 83 yo female diagnosed with Anal cancer and followed by Dr. Cloretta and Dr. Dewey. Will receive concurrent chemoradiation with 5 FU/Mitomycin -C.  PICC placed 12/26.  PMH includes Vitamin B 12 deficiency, DM 2, Colon cancer 2008, right colectomy, Anemia, Hypothyroidism, Depression  Medications include Vit D 3, B 12, Omega 3 fatty acids, MVI, Oxycodone , potassium chloride , Synthroid .  Labs include Glucose 142.  Height: 5'1. Weight:  121 pounds 11.2 oz December 29. 135.9 pounds December 11 UBW: 142 pounds August, 2025. BMI: 23 ECOG: 1  **10% weight loss in 2 weeks/clinically significant  Patient lives with her son Deward. She received her first radiation treatment today. She reports poor appetite and early satiety. She ate one egg this morning for breakfast and hasn't had anything since. Her son offers her other foods but she has not been interested. She thinks some of her decreased oral intake is related to increased pain. Reports loose stool and leakage. She is going to start taking Imodium. She will adjust based on symptoms. She has historically avoided a lot of foods like bananas and peanut butter crackers because of her DM. She tried Ensure a long time ago and didn't like it. She tried [employer] Complete today and enjoyed it.   Nutrition Diagnosis: Food and Nutrition Related Knowledge Deficit related to cancer and associated treatments as evidenced by no prior need for nutrition related information.  Intervention: Consume small, frequent meals and snacks throughout the day. Focus on higher calorie, higher protein foods. Reviewed low fiber diet for management of diarrhea. Liberalize diet to include more foods but minimize fiber. Consider ONS such as Ensure Plus or equivalent 2-3 cartons daily. Nutrition fact sheets provided. Recipe booklet given. Contact information given. Provided samples and coupons. Contact information given.  Monitoring, Evaluation, Goals: Tolerate adequate  calories and protein to minimize weight loss.  Next Visit:  Monday, January 26, during infusion

## 2024-07-21 ENCOUNTER — Telehealth: Payer: Self-pay

## 2024-07-21 ENCOUNTER — Other Ambulatory Visit: Payer: Self-pay

## 2024-07-21 ENCOUNTER — Other Ambulatory Visit: Payer: Self-pay | Admitting: *Deleted

## 2024-07-21 ENCOUNTER — Ambulatory Visit
Admission: RE | Admit: 2024-07-21 | Discharge: 2024-07-21 | Disposition: A | Discharge status: Home / Self Care | Source: Ambulatory Visit | Attending: Radiation Oncology | Admitting: Radiation Oncology

## 2024-07-21 DIAGNOSIS — Z510 Encounter for antineoplastic radiation therapy: Secondary | ICD-10-CM | POA: Diagnosis not present

## 2024-07-21 LAB — RAD ONC ARIA SESSION SUMMARY
Course Elapsed Days: 1
Plan Fractions Treated to Date: 2
Plan Prescribed Dose Per Fraction: 1.8 Gy
Plan Total Fractions Prescribed: 30
Plan Total Prescribed Dose: 54 Gy
Reference Point Dosage Given to Date: 3.6 Gy
Reference Point Session Dosage Given: 1.8 Gy
Session Number: 2

## 2024-07-21 MED ORDER — ONDANSETRON HCL 8 MG PO TABS: 8.0000 mg | ORAL_TABLET | Freq: Three times a day (TID) | ORAL | 0 refills | Status: AC | PRN

## 2024-07-21 MED ORDER — PROCHLORPERAZINE MALEATE 10 MG PO TABS: 10.0000 mg | ORAL_TABLET | Freq: Four times a day (QID) | ORAL | 0 refills | Status: AC | PRN

## 2024-07-21 NOTE — Telephone Encounter (Signed)
 24 hr call back:  Called son of pt (caregiver) to see how pt was doing after 1st treatment yesterday. Said pt was doing fine and had no questions. Confirmed with son that pt has a 1245 radiation appointment on Friday and then will come here at 4 pm to have pump removed.

## 2024-07-22 ENCOUNTER — Ambulatory Visit
Admission: RE | Admit: 2024-07-22 | Discharge: 2024-07-22 | Disposition: A | Discharge status: Home / Self Care | Source: Ambulatory Visit | Attending: Radiation Oncology | Admitting: Radiation Oncology

## 2024-07-22 ENCOUNTER — Telehealth: Payer: Self-pay

## 2024-07-22 ENCOUNTER — Other Ambulatory Visit: Payer: Self-pay | Admitting: *Deleted

## 2024-07-22 ENCOUNTER — Other Ambulatory Visit: Payer: Self-pay

## 2024-07-22 DIAGNOSIS — Z510 Encounter for antineoplastic radiation therapy: Secondary | ICD-10-CM | POA: Diagnosis not present

## 2024-07-22 LAB — RAD ONC ARIA SESSION SUMMARY
Course Elapsed Days: 2
Plan Fractions Treated to Date: 3
Plan Prescribed Dose Per Fraction: 1.8 Gy
Plan Total Fractions Prescribed: 30
Plan Total Prescribed Dose: 54 Gy
Reference Point Dosage Given to Date: 5.4 Gy
Reference Point Session Dosage Given: 1.8 Gy
Session Number: 3

## 2024-07-22 NOTE — Telephone Encounter (Signed)
 [facility] CSW Progress Note  Patient left VM for CSW stating she is needing assistance in home with symptoms related to diarrhea. CSW attempted to reach patient on this date to assess needs. No answer. Left general VM  Lizbeth Sprague, LCSW Clinical Social Worker [facility]

## 2024-07-23 ENCOUNTER — Other Ambulatory Visit: Payer: Self-pay | Admitting: Internal Medicine

## 2024-07-24 ENCOUNTER — Ambulatory Visit
Admission: RE | Admit: 2024-07-24 | Discharge: 2024-07-24 | Disposition: A | Discharge status: Home / Self Care | Source: Ambulatory Visit | Attending: Radiation Oncology | Admitting: Radiation Oncology

## 2024-07-24 ENCOUNTER — Other Ambulatory Visit: Payer: Self-pay

## 2024-07-24 ENCOUNTER — Inpatient Hospital Stay

## 2024-07-24 ENCOUNTER — Inpatient Hospital Stay: Attending: Oncology

## 2024-07-24 ENCOUNTER — Ambulatory Visit
Admission: RE | Admit: 2024-07-24 | Discharge: 2024-07-24 | Disposition: A | Source: Ambulatory Visit | Attending: Radiation Oncology

## 2024-07-24 DIAGNOSIS — E538 Deficiency of other specified B group vitamins: Secondary | ICD-10-CM | POA: Insufficient documentation

## 2024-07-24 DIAGNOSIS — Z5111 Encounter for antineoplastic chemotherapy: Secondary | ICD-10-CM | POA: Insufficient documentation

## 2024-07-24 DIAGNOSIS — D701 Agranulocytosis secondary to cancer chemotherapy: Secondary | ICD-10-CM | POA: Insufficient documentation

## 2024-07-24 DIAGNOSIS — Z9049 Acquired absence of other specified parts of digestive tract: Secondary | ICD-10-CM | POA: Insufficient documentation

## 2024-07-24 DIAGNOSIS — K625 Hemorrhage of anus and rectum: Secondary | ICD-10-CM | POA: Insufficient documentation

## 2024-07-24 DIAGNOSIS — R7303 Prediabetes: Secondary | ICD-10-CM | POA: Insufficient documentation

## 2024-07-24 DIAGNOSIS — R531 Weakness: Secondary | ICD-10-CM | POA: Insufficient documentation

## 2024-07-24 DIAGNOSIS — T451X5A Adverse effect of antineoplastic and immunosuppressive drugs, initial encounter: Secondary | ICD-10-CM | POA: Insufficient documentation

## 2024-07-24 DIAGNOSIS — R197 Diarrhea, unspecified: Secondary | ICD-10-CM | POA: Insufficient documentation

## 2024-07-24 DIAGNOSIS — Z85038 Personal history of other malignant neoplasm of large intestine: Secondary | ICD-10-CM | POA: Insufficient documentation

## 2024-07-24 DIAGNOSIS — R110 Nausea: Secondary | ICD-10-CM | POA: Insufficient documentation

## 2024-07-24 DIAGNOSIS — Z79891 Long term (current) use of opiate analgesic: Secondary | ICD-10-CM | POA: Insufficient documentation

## 2024-07-24 DIAGNOSIS — Z510 Encounter for antineoplastic radiation therapy: Secondary | ICD-10-CM | POA: Insufficient documentation

## 2024-07-24 DIAGNOSIS — E039 Hypothyroidism, unspecified: Secondary | ICD-10-CM | POA: Insufficient documentation

## 2024-07-24 DIAGNOSIS — G893 Neoplasm related pain (acute) (chronic): Secondary | ICD-10-CM | POA: Insufficient documentation

## 2024-07-24 DIAGNOSIS — K1231 Oral mucositis (ulcerative) due to antineoplastic therapy: Secondary | ICD-10-CM | POA: Insufficient documentation

## 2024-07-24 DIAGNOSIS — C210 Malignant neoplasm of anus, unspecified: Secondary | ICD-10-CM | POA: Insufficient documentation

## 2024-07-24 LAB — RAD ONC ARIA SESSION SUMMARY
Course Elapsed Days: 4
Plan Fractions Treated to Date: 4
Plan Prescribed Dose Per Fraction: 1.8 Gy
Plan Total Fractions Prescribed: 30
Plan Total Prescribed Dose: 54 Gy
Reference Point Dosage Given to Date: 7.2 Gy
Reference Point Session Dosage Given: 1.8 Gy
Session Number: 4

## 2024-07-24 NOTE — Progress Notes (Signed)
 PICC Removal Note: PICC line removed from right upper arm after sterile site prepped per protocol. PICC catheter tip visualized (28 cm) and intact. Pressure dressing applied with petroleum gauze, regular gauze, and Tegaderm dressing. No redness, ecchymosis, edema, swelling, or drainage noted at site. Patient tolerated procedure well and remained supine for 30 min afterwards. Instructions provided to patient/her son on post PICC discharge care, including followup notification instructions. VSS and patient ambulated out of clinic with rolling walker without incident   Vitals:   07/24/24 1600 07/24/24 1653  BP: (!) 133/52 121/60  Pulse: 75 67  Resp: 16 16  Temp: 98 F (36.7 C)   SpO2: 94% 93%

## 2024-07-24 NOTE — Patient Instructions (Signed)
 PICC Removal, Adult, Care After The following information offers guidance on how to care for yourself after your procedure. Your health care provider may also give you more specific instructions. If you have problems or questions, contact your health care provider. What can I expect after the procedure? After the procedure, it is common to have: Tenderness or soreness. Redness, swelling, or a scab at the place where your PICC was removed (exit site). Follow these instructions at home: For the first 24 hours after the procedure: Keep the bandage (dressing) on your exit site clean and dry. Do not remove your dressing until your health care provider tells you to do so. Do not lift anything heavy or do activities that require great effort until your health care provider says it is okay. You should avoid: Lifting weights. Doing yard work. Doing any physical activity with repetitive arm movement. Watch closely for any signs of an air bubble in the vein (air embolism). This is a rare but serious complication. Signs of an air embolism include trouble breathing, wheezing, chest pain, or a fast pulse. If you have signs of an air embolism, call 911 right away and lie down on your left side to keep the air from moving into your lungs. After 24 hours have passed:  Remove your dressing as told by your health care provider. Wash your hands with soap and water for at least 20 seconds before and after you change your dressing. If soap and water are not available, use hand sanitizer. Return to your normal activities as told by your health care provider. A small scab may develop over the exit site. Do not pick at the scab. When bathing or showering, gently wash the exit site with soap and water. Pat it dry. Watch for signs of infection, such as: A fever or chills. Swollen glands under your arm. More redness, swelling, or soreness around your arm. Blood, fluid, or pus coming from your exit site. Warmth or a  bad smell coming from your exit site. A red streak spreading away from your exit site. General instructions Take over-the-counter and prescription medicines only as told by your health care provider. Do not take any new medicines without checking with your health care provider first. If you were given an antibiotic ointment, apply it as told by your health care provider. Keep all follow-up visits. This is important. Contact a health care provider if: You have a fever or chills. You have swelling at your exit site or swollen glands under your arm. You have signs of infection at your exit site. You have soreness, redness, or swelling in your arm that gets worse. Get help right away if: You have numbness or tingling in your fingers, hand, or arm. Your arm looks blue and feels cold. You have signs of an air embolism, such as trouble breathing, wheezing, chest pain, or a fast pulse. These symptoms may be an emergency. Get medical help right away. Call 911. Do not wait to see if the symptoms will go away. Do not drive yourself to the hospital. Summary After a PICC is removed, it is common to have tenderness or soreness, redness, swelling, or a scab at the exit site. Keep the bandage (dressing) over the exit site clean and dry. Do not remove the dressing until your health care provider tells you to do so. Do not lift anything heavy or do activities that require great effort until your health care provider says it is okay. Watch closely for any signs  of an air bubble (air embolism). If you have signs of an air embolism, call 911 right away and lie down on your left side. This information is not intended to replace advice given to you by your health care provider. Make sure you discuss any questions you have with your health care provider. Document Revised: 01/25/2021 Document Reviewed: 01/25/2021 Elsevier Patient Education  2024 [employer].

## 2024-07-27 ENCOUNTER — Ambulatory Visit
Admission: RE | Admit: 2024-07-27 | Discharge: 2024-07-27 | Disposition: A | Discharge status: Home / Self Care | Source: Ambulatory Visit | Attending: Radiation Oncology | Admitting: Radiation Oncology

## 2024-07-27 ENCOUNTER — Encounter: Payer: Self-pay | Admitting: Radiation Oncology

## 2024-07-27 ENCOUNTER — Telehealth: Payer: Self-pay | Admitting: *Deleted

## 2024-07-27 ENCOUNTER — Other Ambulatory Visit: Payer: Self-pay

## 2024-07-27 LAB — RAD ONC ARIA SESSION SUMMARY
Course Elapsed Days: 7
Plan Fractions Treated to Date: 5
Plan Prescribed Dose Per Fraction: 1.8 Gy
Plan Total Fractions Prescribed: 30
Plan Total Prescribed Dose: 54 Gy
Reference Point Dosage Given to Date: 9 Gy
Reference Point Session Dosage Given: 1.8 Gy
Session Number: 5

## 2024-07-27 NOTE — Telephone Encounter (Signed)
 Ms. Whitney [name] left VM asking for status of referral to have someone look at her bottom, as it is very painful and irritated. Sent message to Dr. Smitty nurse asking for an assessment of area next time she comes in. Left message for patient to ask radiation oncology to look at area and advise.

## 2024-07-27 NOTE — Telephone Encounter (Signed)
 Patient reports she found a pain pill in the bed with her. Thought she had taken it earlier, but unsure. Asking what to do. Instructed her to wait few hours before taking it again if she is in pain. Suggested she sit up on side of bed to take her pills and write down what time she takes it. She tells RN that radiation oncology looked at her bottom today and cleaned it and helped with it.

## 2024-07-27 NOTE — Progress Notes (Signed)
"   The patient is currently receiving chemoradiation for anal carcinoma, she has visible tumor at the anus and extending into the gluteal cleft based on her PET scan.  She has been experiencing difficulty with control of her bowel and bladder activity, she was seen after her treatment #5 today of the planned 30 fractions due to concerns about the site.  Medical oncology had also notified us  that she was complaining of discomfort in concerns about cleanliness given her bowel and bladder dysfunction.  On exam, the stool was wiped away from the tissue with moistened washcloths which had dried to the skin.  There is early change of necrosis of the tumor itself with very minimal breakdown of the skin, the lesion does extend from the posterior anus into the gluteal cleft.  No evidence of bleeding was appreciated or other focal findings suspicious for radiation dermatitis.  We discussed the importance of trying to continue with radiotherapy and discussed some hygienic approaches to try and keep the area clean.  She will continue with radiation and chemotherapy as previously outlined.  She is also aware that we would not recommend cream to the site out of concern that this could worsen her symptoms from radiotherapy. "

## 2024-07-28 ENCOUNTER — Telehealth: Payer: Self-pay

## 2024-07-28 ENCOUNTER — Ambulatory Visit

## 2024-07-28 ENCOUNTER — Other Ambulatory Visit: Payer: Self-pay

## 2024-07-28 LAB — RAD ONC ARIA SESSION SUMMARY
Course Elapsed Days: 8
Plan Fractions Treated to Date: 6
Plan Prescribed Dose Per Fraction: 1.8 Gy
Plan Total Fractions Prescribed: 30
Plan Total Prescribed Dose: 54 Gy
Reference Point Dosage Given to Date: 10.8 Gy
Reference Point Session Dosage Given: 1.8 Gy
Session Number: 6

## 2024-07-28 NOTE — Telephone Encounter (Signed)
 [facility] CSW Progress Note  Clinical Social Worker attempted to reach pt's son as requested to discuss home health support. No Answer. VM box full.    Lizbeth Sprague, LCSW Clinical Social Worker [facility]

## 2024-07-29 ENCOUNTER — Other Ambulatory Visit: Payer: Self-pay

## 2024-07-29 ENCOUNTER — Ambulatory Visit
Admission: RE | Admit: 2024-07-29 | Discharge: 2024-07-29 | Disposition: A | Discharge status: Home / Self Care | Source: Ambulatory Visit | Attending: Radiation Oncology | Admitting: Radiation Oncology

## 2024-07-29 LAB — RAD ONC ARIA SESSION SUMMARY
Course Elapsed Days: 9
Plan Fractions Treated to Date: 7
Plan Prescribed Dose Per Fraction: 1.8 Gy
Plan Total Fractions Prescribed: 30
Plan Total Prescribed Dose: 54 Gy
Reference Point Dosage Given to Date: 12.6 Gy
Reference Point Session Dosage Given: 1.8 Gy
Session Number: 7

## 2024-07-30 ENCOUNTER — Other Ambulatory Visit: Payer: Self-pay | Admitting: *Deleted

## 2024-07-30 ENCOUNTER — Other Ambulatory Visit: Payer: Self-pay

## 2024-07-30 ENCOUNTER — Ambulatory Visit
Admission: RE | Admit: 2024-07-30 | Discharge: 2024-07-30 | Disposition: A | Discharge status: Home / Self Care | Source: Ambulatory Visit | Attending: Radiation Oncology | Admitting: Radiation Oncology

## 2024-07-30 DIAGNOSIS — C210 Malignant neoplasm of anus, unspecified: Secondary | ICD-10-CM

## 2024-07-30 LAB — RAD ONC ARIA SESSION SUMMARY
Course Elapsed Days: 10
Plan Fractions Treated to Date: 8
Plan Prescribed Dose Per Fraction: 1.8 Gy
Plan Total Fractions Prescribed: 30
Plan Total Prescribed Dose: 54 Gy
Reference Point Dosage Given to Date: 14.4 Gy
Reference Point Session Dosage Given: 1.8 Gy
Session Number: 8

## 2024-07-31 ENCOUNTER — Encounter: Payer: Self-pay | Admitting: Oncology

## 2024-07-31 ENCOUNTER — Encounter (HOSPITAL_BASED_OUTPATIENT_CLINIC_OR_DEPARTMENT_OTHER): Payer: Self-pay

## 2024-07-31 ENCOUNTER — Inpatient Hospital Stay: Admitting: Nurse Practitioner

## 2024-07-31 ENCOUNTER — Inpatient Hospital Stay

## 2024-07-31 ENCOUNTER — Ambulatory Visit
Admission: RE | Admit: 2024-07-31 | Discharge: 2024-07-31 | Disposition: A | Source: Ambulatory Visit | Attending: Radiation Oncology

## 2024-07-31 ENCOUNTER — Other Ambulatory Visit: Payer: Self-pay | Admitting: *Deleted

## 2024-07-31 ENCOUNTER — Other Ambulatory Visit: Payer: Self-pay

## 2024-07-31 ENCOUNTER — Encounter: Payer: Self-pay | Admitting: Nurse Practitioner

## 2024-07-31 ENCOUNTER — Other Ambulatory Visit (HOSPITAL_BASED_OUTPATIENT_CLINIC_OR_DEPARTMENT_OTHER): Payer: Self-pay

## 2024-07-31 VITALS — BP 131/60 | HR 73 | Temp 98.2°F | Resp 18 | Ht 61.0 in | Wt 127.8 lb

## 2024-07-31 DIAGNOSIS — C210 Malignant neoplasm of anus, unspecified: Secondary | ICD-10-CM

## 2024-07-31 LAB — RAD ONC ARIA SESSION SUMMARY
Course Elapsed Days: 11
Plan Fractions Treated to Date: 9
Plan Prescribed Dose Per Fraction: 1.8 Gy
Plan Total Fractions Prescribed: 30
Plan Total Prescribed Dose: 54 Gy
Reference Point Dosage Given to Date: 16.2 Gy
Reference Point Session Dosage Given: 1.8 Gy
Session Number: 9

## 2024-07-31 LAB — CBC WITH DIFFERENTIAL (CANCER CENTER ONLY)
Abs Immature Granulocytes: 0.05 K/uL (ref 0.00–0.07)
Basophils Absolute: 0 K/uL (ref 0.0–0.1)
Basophils Relative: 0 %
Eosinophils Absolute: 0.1 K/uL (ref 0.0–0.5)
Eosinophils Relative: 8 %
HCT: 28.3 % — ABNORMAL LOW (ref 36.0–46.0)
Hemoglobin: 9.3 g/dL — ABNORMAL LOW (ref 12.0–15.0)
Immature Granulocytes: 7 %
Lymphocytes Relative: 41 %
Lymphs Abs: 0.3 K/uL — ABNORMAL LOW (ref 0.7–4.0)
MCH: 28.4 pg (ref 26.0–34.0)
MCHC: 32.9 g/dL (ref 30.0–36.0)
MCV: 86.5 fL (ref 80.0–100.0)
Monocytes Absolute: 0 K/uL — ABNORMAL LOW (ref 0.1–1.0)
Monocytes Relative: 5 %
Neutro Abs: 0.3 K/uL — CL (ref 1.7–7.7)
Neutrophils Relative %: 39 %
Platelet Count: 68 K/uL — ABNORMAL LOW (ref 150–400)
RBC: 3.27 MIL/uL — ABNORMAL LOW (ref 3.87–5.11)
RDW: 13.7 % (ref 11.5–15.5)
WBC Count: 0.8 K/uL — CL (ref 4.0–10.5)
nRBC: 0 % (ref 0.0–0.2)

## 2024-07-31 MED ORDER — CIPROFLOXACIN HCL 500 MG PO TABS
500.0000 mg | ORAL_TABLET | Freq: Two times a day (BID) | ORAL | 0 refills | Status: AC
  Filled 2024-07-31: qty 10, 5d supply, fill #0

## 2024-07-31 MED ORDER — OXYCODONE HCL 5 MG PO TABS: 5.0000 mg | ORAL_TABLET | ORAL | 0 refills | Status: DC | PRN

## 2024-07-31 MED ORDER — OXYCODONE HCL 5 MG PO TABS
5.0000 mg | ORAL_TABLET | ORAL | 0 refills | Status: DC | PRN
  Filled 2024-07-31 – 2024-08-01 (×2): qty 75, 13d supply, fill #0

## 2024-07-31 MED ORDER — MAGIC MOUTHWASH: 5.0000 mL | Freq: Four times a day (QID) | ORAL | 1 refills | Status: DC | PRN

## 2024-07-31 MED ORDER — NYSTATIN 100000 UNIT/ML MT SUSP
5.0000 mL | Freq: Four times a day (QID) | OROMUCOSAL | 1 refills | Status: AC
  Filled 2024-07-31: qty 240, 12d supply, fill #0
  Filled 2024-08-18: qty 240, 12d supply, fill #1

## 2024-07-31 NOTE — Progress Notes (Signed)
" °  [facility] OFFICE PROGRESS NOTE   Diagnosis: Anal cancer  INTERVAL HISTORY:   Whitney [name] returns as scheduled.  She continues radiation.  She completed cycle one 5-FU/Mitomycin -C 07/20/2024.  She had a few of notes of nausea.  Mild tenderness.  She is tolerating fluid without difficulty.  She continues to be incontinent of liquid stool.  For rectal pain she is taking oxycodone  every 4-5 hours.  No fever.  No shaking chills.  Objective:  Vital signs in last 24 hours:  Blood pressure 131/60, pulse 73, temperature 98.2 F (36.8 C), resp. rate 18, height 5' 1 (1.549 m), weight 127 lb 12.8 oz (58 kg), SpO2 100%.    HEENT: Erythema at the right posterior palate.  Small area of ulceration left buccal mucosa and right tip of tongue.  Small area of erythema right buccal mucosa.  Mucous membranes appear moist. Resp: Lungs clear bilaterally. Cardio: Regular rate and rhythm. GI: Abdomen soft and nontender. Vascular: No leg edema. Skin: Perianal skin is erythematous.  Superficial ulceration of skin right perineum.  Mass at the left anal verge appears smaller.   Lab Results:  Lab Results  Component Value Date   WBC 0.8 (LL) 07/31/2024   HGB 9.3 (L) 07/31/2024   HCT 28.3 (L) 07/31/2024   MCV 86.5 07/31/2024   PLT 68 (L) 07/31/2024   NEUTROABS 0.3 (LL) 07/31/2024    Imaging:  No results found.  Medications: I have reviewed the patient's current medications.  Assessment/Plan: Anal cancer Rectal exam by Dr. Debby 06/23/2024-left lateral firm fixed mass, biopsy showed well-differentiated squamous cell carcinoma CTs 06/28/2024-prominent irregularity and abnormal enhancement along the anal canal; possible ulceration along the anal canal with irregular somewhat patulous appearance.  The area of irregularity measured about 4.2 x 3.7 x 3.9 cm.  A 0.4 cm perirectal lymph node noted along the left posterior perirectal adipose tissue; left external iliac node 0.8 cm. PET  scan 07/08/2024-hypermetabolic distal anal primary with direct extension and involvement of the left gluteal crease.  Left external iliac and inguinal small but hypermetabolic nodes suspicious for metastatic disease.  Mild hypermetabolism involving the L1 vertebral body without CT correlate. 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  She is accompanied by her son.  She continues radiation.  She completed cycle one 5-FU/Mitomycin -C 07/20/2024.  She has mild mucositis.  She is tolerating fluids.  Prescription sent to the pharmacy for Magic mouthwash.    CBC reviewed.  She has significant neutropenia and moderate thrombocytopenia.  Precautions reviewed.  She understands to contact the office with fever, chills, other signs of infection, bleeding.  She will begin prophylactic ciprofloxacin .  We discussed the tendon rupture and C. difficile colitis associated with fluoroquinolones.  She agrees to proceed.  Repeat CBC 08/03/2024.  She will continue oxycodone  as needed for pain.  New prescription sent to her pharmacy.  Referral placed for PICC line placement 08/14/2024.  Office visit and cycle two 5-FU/Mitomycin -C 08/17/2024.  We are available to see her sooner if needed.   Olam Debby ANP/GNP-BC   07/31/2024  2:48 PM        "

## 2024-08-01 ENCOUNTER — Other Ambulatory Visit (HOSPITAL_BASED_OUTPATIENT_CLINIC_OR_DEPARTMENT_OTHER): Payer: Self-pay

## 2024-08-03 ENCOUNTER — Ambulatory Visit
Admission: RE | Admit: 2024-08-03 | Discharge: 2024-08-03 | Disposition: A | Source: Ambulatory Visit | Attending: Radiation Oncology

## 2024-08-03 ENCOUNTER — Other Ambulatory Visit: Payer: Self-pay

## 2024-08-03 ENCOUNTER — Other Ambulatory Visit: Payer: Self-pay | Admitting: *Deleted

## 2024-08-03 ENCOUNTER — Inpatient Hospital Stay

## 2024-08-03 DIAGNOSIS — C210 Malignant neoplasm of anus, unspecified: Secondary | ICD-10-CM

## 2024-08-03 LAB — CBC WITH DIFFERENTIAL (CANCER CENTER ONLY)
Abs Immature Granulocytes: 0 K/uL (ref 0.00–0.07)
Basophils Absolute: 0 K/uL (ref 0.0–0.1)
Basophils Relative: 0 %
Eosinophils Absolute: 0.1 K/uL (ref 0.0–0.5)
Eosinophils Relative: 8 %
HCT: 24.4 % — ABNORMAL LOW (ref 36.0–46.0)
Hemoglobin: 8.3 g/dL — ABNORMAL LOW (ref 12.0–15.0)
Immature Granulocytes: 0 %
Lymphocytes Relative: 41 %
Lymphs Abs: 0.4 K/uL — ABNORMAL LOW (ref 0.7–4.0)
MCH: 28.8 pg (ref 26.0–34.0)
MCHC: 34 g/dL (ref 30.0–36.0)
MCV: 84.7 fL (ref 80.0–100.0)
Monocytes Absolute: 0.1 K/uL (ref 0.1–1.0)
Monocytes Relative: 14 %
Neutro Abs: 0.4 K/uL — CL (ref 1.7–7.7)
Neutrophils Relative %: 37 %
Platelet Count: 20 K/uL — ABNORMAL LOW (ref 150–400)
RBC: 2.88 MIL/uL — ABNORMAL LOW (ref 3.87–5.11)
RDW: 13.2 % (ref 11.5–15.5)
WBC Count: 1 K/uL — ABNORMAL LOW (ref 4.0–10.5)
nRBC: 0 % (ref 0.0–0.2)

## 2024-08-03 LAB — RAD ONC ARIA SESSION SUMMARY
Course Elapsed Days: 14
Plan Fractions Treated to Date: 10
Plan Prescribed Dose Per Fraction: 1.8 Gy
Plan Total Fractions Prescribed: 30
Plan Total Prescribed Dose: 54 Gy
Reference Point Dosage Given to Date: 18 Gy
Reference Point Session Dosage Given: 1.8 Gy
Session Number: 10

## 2024-08-04 ENCOUNTER — Ambulatory Visit
Admission: RE | Admit: 2024-08-04 | Discharge: 2024-08-04 | Disposition: A | Source: Ambulatory Visit | Attending: Radiation Oncology

## 2024-08-04 ENCOUNTER — Inpatient Hospital Stay

## 2024-08-04 ENCOUNTER — Telehealth: Payer: Self-pay

## 2024-08-04 ENCOUNTER — Other Ambulatory Visit: Payer: Self-pay

## 2024-08-04 LAB — RAD ONC ARIA SESSION SUMMARY
Course Elapsed Days: 15
Plan Fractions Treated to Date: 11
Plan Prescribed Dose Per Fraction: 1.8 Gy
Plan Total Fractions Prescribed: 30
Plan Total Prescribed Dose: 54 Gy
Reference Point Dosage Given to Date: 19.8 Gy
Reference Point Session Dosage Given: 1.8 Gy
Session Number: 11

## 2024-08-04 NOTE — Telephone Encounter (Signed)
 Labs Appointment Update  Original Appointment: 08/04/2024 at 2:15 PM Reason: Patient unable to get labs drawn as scheduled because she was told she didn't have any appointment scheduled. Rescheduled Appointment: 08/05/2024 at 10:30 AM Communication: Patients son informed of the change. Additional Support: Provided charge nurses name Ivy Burrs, with permission) and advised to contact her if further challenges arise. Response: Son was agreeable and verbalized understanding.

## 2024-08-04 NOTE — Telephone Encounter (Signed)
 Contacted patients son regarding Olam, NPs request for patient to come in today for labs and possible platelet transfusion. Son stated he is currently at work and unable to bring patient in today. He agreed for patient to have labs drawn prior to her radiation treatment at [facility] this afternoon at 2:15 PM. Son agreed that if platelet transfusion is needed, it will be scheduled for Wednesday, 08/05/2024 at 11:30 AM at [facility], prior to her radiation treatment in the afternoon.  Son verbalized understanding and agreement with plan.

## 2024-08-05 ENCOUNTER — Inpatient Hospital Stay

## 2024-08-05 ENCOUNTER — Other Ambulatory Visit: Payer: Self-pay

## 2024-08-05 ENCOUNTER — Other Ambulatory Visit: Payer: Self-pay | Admitting: Nurse Practitioner

## 2024-08-05 ENCOUNTER — Ambulatory Visit: Admission: RE | Admit: 2024-08-05 | Discharge: 2024-08-05 | Attending: Radiation Oncology

## 2024-08-05 ENCOUNTER — Ambulatory Visit
Admission: RE | Admit: 2024-08-05 | Discharge: 2024-08-05 | Disposition: A | Discharge status: Home / Self Care | Source: Ambulatory Visit | Attending: Radiation Oncology | Admitting: Radiation Oncology

## 2024-08-05 DIAGNOSIS — C210 Malignant neoplasm of anus, unspecified: Secondary | ICD-10-CM

## 2024-08-05 LAB — RAD ONC ARIA SESSION SUMMARY
Course Elapsed Days: 16
Plan Fractions Treated to Date: 12
Plan Prescribed Dose Per Fraction: 1.8 Gy
Plan Total Fractions Prescribed: 30
Plan Total Prescribed Dose: 54 Gy
Reference Point Dosage Given to Date: 21.6 Gy
Reference Point Session Dosage Given: 1.8 Gy
Session Number: 12

## 2024-08-05 LAB — CBC WITH DIFFERENTIAL (CANCER CENTER ONLY)
Abs Immature Granulocytes: 0.02 K/uL (ref 0.00–0.07)
Basophils Absolute: 0 K/uL (ref 0.0–0.1)
Basophils Relative: 1 %
Eosinophils Absolute: 0.1 K/uL (ref 0.0–0.5)
Eosinophils Relative: 3 %
HCT: 24.5 % — ABNORMAL LOW (ref 36.0–46.0)
Hemoglobin: 8.3 g/dL — ABNORMAL LOW (ref 12.0–15.0)
Immature Granulocytes: 1 %
Lymphocytes Relative: 19 %
Lymphs Abs: 0.3 K/uL — ABNORMAL LOW (ref 0.7–4.0)
MCH: 28.7 pg (ref 26.0–34.0)
MCHC: 33.9 g/dL (ref 30.0–36.0)
MCV: 84.8 fL (ref 80.0–100.0)
Monocytes Absolute: 0.2 K/uL (ref 0.1–1.0)
Monocytes Relative: 13 %
Neutro Abs: 1 K/uL — ABNORMAL LOW (ref 1.7–7.7)
Neutrophils Relative %: 63 %
Platelet Count: 28 K/uL — ABNORMAL LOW (ref 150–400)
RBC: 2.89 MIL/uL — ABNORMAL LOW (ref 3.87–5.11)
RDW: 13.3 % (ref 11.5–15.5)
Smear Review: NORMAL
WBC Count: 1.5 K/uL — ABNORMAL LOW (ref 4.0–10.5)
nRBC: 0 % (ref 0.0–0.2)

## 2024-08-05 LAB — SAMPLE TO BLOOD BANK

## 2024-08-05 NOTE — Progress Notes (Signed)
 Pt did not need transfusion support, dc from infusion area with walker independently with son present

## 2024-08-06 ENCOUNTER — Other Ambulatory Visit: Payer: Self-pay

## 2024-08-06 ENCOUNTER — Ambulatory Visit
Admission: RE | Admit: 2024-08-06 | Discharge: 2024-08-06 | Disposition: A | Source: Ambulatory Visit | Attending: Radiation Oncology

## 2024-08-06 LAB — RAD ONC ARIA SESSION SUMMARY
Course Elapsed Days: 17
Plan Fractions Treated to Date: 13
Plan Prescribed Dose Per Fraction: 1.8 Gy
Plan Total Fractions Prescribed: 30
Plan Total Prescribed Dose: 54 Gy
Reference Point Dosage Given to Date: 23.4 Gy
Reference Point Session Dosage Given: 1.8 Gy
Session Number: 13

## 2024-08-07 ENCOUNTER — Ambulatory Visit
Admission: RE | Admit: 2024-08-07 | Discharge: 2024-08-07 | Disposition: A | Discharge status: Home / Self Care | Source: Ambulatory Visit | Attending: Radiation Oncology | Admitting: Radiation Oncology

## 2024-08-07 ENCOUNTER — Other Ambulatory Visit: Payer: Self-pay

## 2024-08-07 LAB — RAD ONC ARIA SESSION SUMMARY
Course Elapsed Days: 18
Plan Fractions Treated to Date: 14
Plan Prescribed Dose Per Fraction: 1.8 Gy
Plan Total Fractions Prescribed: 30
Plan Total Prescribed Dose: 54 Gy
Reference Point Dosage Given to Date: 25.2 Gy
Reference Point Session Dosage Given: 1.8 Gy
Session Number: 14

## 2024-08-10 ENCOUNTER — Ambulatory Visit
Admission: RE | Admit: 2024-08-10 | Discharge: 2024-08-10 | Disposition: A | Source: Ambulatory Visit | Attending: Radiation Oncology

## 2024-08-10 ENCOUNTER — Other Ambulatory Visit: Payer: Self-pay

## 2024-08-10 LAB — RAD ONC ARIA SESSION SUMMARY
Course Elapsed Days: 21
Plan Fractions Treated to Date: 15
Plan Prescribed Dose Per Fraction: 1.8 Gy
Plan Total Fractions Prescribed: 30
Plan Total Prescribed Dose: 54 Gy
Reference Point Dosage Given to Date: 27 Gy
Reference Point Session Dosage Given: 1.8 Gy
Session Number: 15

## 2024-08-11 ENCOUNTER — Ambulatory Visit
Admission: RE | Admit: 2024-08-11 | Discharge: 2024-08-11 | Disposition: A | Source: Ambulatory Visit | Attending: Radiation Oncology

## 2024-08-11 ENCOUNTER — Other Ambulatory Visit: Payer: Self-pay

## 2024-08-11 LAB — RAD ONC ARIA SESSION SUMMARY
Course Elapsed Days: 22
Plan Fractions Treated to Date: 16
Plan Prescribed Dose Per Fraction: 1.8 Gy
Plan Total Fractions Prescribed: 30
Plan Total Prescribed Dose: 54 Gy
Reference Point Dosage Given to Date: 28.8 Gy
Reference Point Session Dosage Given: 1.8 Gy
Session Number: 16

## 2024-08-12 ENCOUNTER — Other Ambulatory Visit: Payer: Self-pay

## 2024-08-12 ENCOUNTER — Ambulatory Visit
Admission: RE | Admit: 2024-08-12 | Discharge: 2024-08-12 | Disposition: A | Source: Ambulatory Visit | Attending: Radiation Oncology

## 2024-08-12 LAB — RAD ONC ARIA SESSION SUMMARY
Course Elapsed Days: 23
Plan Fractions Treated to Date: 17
Plan Prescribed Dose Per Fraction: 1.8 Gy
Plan Total Fractions Prescribed: 30
Plan Total Prescribed Dose: 54 Gy
Reference Point Dosage Given to Date: 30.6 Gy
Reference Point Session Dosage Given: 1.8 Gy
Session Number: 17

## 2024-08-13 ENCOUNTER — Other Ambulatory Visit: Payer: Self-pay | Admitting: Nurse Practitioner

## 2024-08-13 ENCOUNTER — Telehealth: Payer: Self-pay

## 2024-08-13 ENCOUNTER — Ambulatory Visit
Admission: RE | Admit: 2024-08-13 | Discharge: 2024-08-13 | Disposition: A | Discharge status: Home / Self Care | Source: Ambulatory Visit | Attending: Radiation Oncology | Admitting: Radiation Oncology

## 2024-08-13 ENCOUNTER — Other Ambulatory Visit: Payer: Self-pay

## 2024-08-13 DIAGNOSIS — C210 Malignant neoplasm of anus, unspecified: Secondary | ICD-10-CM

## 2024-08-13 LAB — RAD ONC ARIA SESSION SUMMARY
Course Elapsed Days: 24
Plan Fractions Treated to Date: 18
Plan Prescribed Dose Per Fraction: 1.8 Gy
Plan Total Fractions Prescribed: 30
Plan Total Prescribed Dose: 54 Gy
Reference Point Dosage Given to Date: 32.4 Gy
Reference Point Session Dosage Given: 1.8 Gy
Session Number: 18

## 2024-08-13 MED ORDER — OXYCODONE HCL 5 MG PO TABS: 5.0000 mg | ORAL_TABLET | ORAL | 0 refills | Status: DC | PRN

## 2024-08-13 NOTE — Telephone Encounter (Signed)
 Left the patient a detailed voicemail regarding her appointment time on Monday, 08/17/2024, being moved from 0745 to 1000 due to the inclement weather forecasted for this weekend. Also informed her NUT appointment will be a telephone visit at 0945. Left call back number for questions or further clarification.

## 2024-08-14 ENCOUNTER — Other Ambulatory Visit: Payer: Self-pay

## 2024-08-14 ENCOUNTER — Ambulatory Visit
Admission: RE | Admit: 2024-08-14 | Discharge: 2024-08-14 | Disposition: A | Source: Ambulatory Visit | Attending: Radiation Oncology

## 2024-08-14 ENCOUNTER — Other Ambulatory Visit: Payer: Self-pay | Admitting: Radiation Oncology

## 2024-08-14 ENCOUNTER — Telehealth: Payer: Self-pay

## 2024-08-14 ENCOUNTER — Ambulatory Visit (HOSPITAL_COMMUNITY)
Admission: RE | Admit: 2024-08-14 | Discharge: 2024-08-14 | Disposition: A | Source: Ambulatory Visit | Attending: Nurse Practitioner | Admitting: Nurse Practitioner

## 2024-08-14 DIAGNOSIS — C210 Malignant neoplasm of anus, unspecified: Secondary | ICD-10-CM | POA: Insufficient documentation

## 2024-08-14 LAB — RAD ONC ARIA SESSION SUMMARY
Course Elapsed Days: 25
Plan Fractions Treated to Date: 19
Plan Prescribed Dose Per Fraction: 1.8 Gy
Plan Total Fractions Prescribed: 30
Plan Total Prescribed Dose: 54 Gy
Reference Point Dosage Given to Date: 34.2 Gy
Reference Point Session Dosage Given: 1.8 Gy
Session Number: 19

## 2024-08-14 MED ORDER — LIDOCAINE HCL 1 % IJ SOLN
20.0000 mL | Freq: Once | INTRAMUSCULAR | Status: AC
  Administered 2024-08-14: 10 mL via INTRADERMAL

## 2024-08-14 MED ORDER — LIDOCAINE-EPINEPHRINE 1 %-1:100000 IJ SOLN: 20.0000 mL | Freq: Once | INTRAMUSCULAR | Status: DC

## 2024-08-14 MED ORDER — HEPARIN SOD (PORK) LOCK FLUSH 100 UNIT/ML IV SOLN
500.0000 [IU] | Freq: Once | INTRAVENOUS | Status: AC
  Administered 2024-08-14: 500 [IU] via INTRAVENOUS

## 2024-08-14 MED ORDER — LIDOCAINE HCL 1 % IJ SOLN
INTRAMUSCULAR | Status: AC
  Filled 2024-08-14: qty 20

## 2024-08-14 MED ORDER — DIPHENOXYLATE-ATROPINE 2.5-0.025 MG PO TABS: 2.0000 | ORAL_TABLET | Freq: Four times a day (QID) | ORAL | 0 refills | Status: AC | PRN

## 2024-08-14 MED ORDER — HEPARIN SOD (PORK) LOCK FLUSH 100 UNIT/ML IV SOLN
INTRAVENOUS | Status: AC
  Filled 2024-08-14: qty 5

## 2024-08-14 NOTE — Procedures (Signed)
 Successful placement of dual lumen PICC line to right brachial vein. Length 31cm Tip at lower SVC/RA PICC capped No complications Ready for use.  EBL < 5 mL  Solmon Selmer Ku PA-C 08/14/2024 9:37 AM

## 2024-08-14 NOTE — Telephone Encounter (Signed)
 Spoke directly with son about patient's appointments on Monday being rescheduled to Tuesday 08/18/2024 and he stated he has received the notification on MyChart and would coordinate to ensure she makes the appointment.

## 2024-08-15 ENCOUNTER — Other Ambulatory Visit: Payer: Self-pay

## 2024-08-16 ENCOUNTER — Other Ambulatory Visit: Payer: Self-pay | Admitting: Oncology

## 2024-08-17 ENCOUNTER — Inpatient Hospital Stay

## 2024-08-17 ENCOUNTER — Telehealth: Payer: Self-pay

## 2024-08-17 ENCOUNTER — Ambulatory Visit

## 2024-08-17 ENCOUNTER — Inpatient Hospital Stay: Admitting: Oncology

## 2024-08-17 ENCOUNTER — Inpatient Hospital Stay: Admitting: Nutrition

## 2024-08-17 NOTE — Progress Notes (Signed)
 Telephone follow up completed with patient receiving radiation treatment for anal cancer. Final radiation therapy is scheduled for Feb 10.  Last weight 125 pounds on January 23. This has improved from 121 pounds 11.2 oz on December 29.  No new labs.  Patient reports diarrhea is better. She has been taking medications as prescribed. Her nausea is intermittent but improves with nausea medications. States she has started to branch out and include more variety in her diet as discussed at last RD visit. She drinks 4 or 5 bottles of Ensure daily and says it at least has flavor. Tastes like a vanilla milkshake. She has adequate ONS at home. No other new nutrition impact symptoms.  Nutrition Diagnosis: Food and Nutrition Related Knowledge Deficit improved.  Intervention: Educated to continue low fiber diet and take medications as prescribed to minimize diarrhea. Continue ONS as needed to minimize weight loss. Continue small amounts of food throughout the day.  Monitoring, Evaluation, Goals: Increase oral intake to minimize weight loss.  No follow up scheduled at this time. Patient has RD phone number for questions. Please place nutrition referral for follow up as needed.

## 2024-08-17 NOTE — Telephone Encounter (Signed)
 Spoke with patient's son directly and advised him of new appointment time on Tuesday 08/18/2024 starting at 1100. He was agreeable and verbalized understanding.

## 2024-08-18 ENCOUNTER — Encounter: Payer: Self-pay | Admitting: Oncology

## 2024-08-18 ENCOUNTER — Inpatient Hospital Stay

## 2024-08-18 ENCOUNTER — Other Ambulatory Visit (HOSPITAL_BASED_OUTPATIENT_CLINIC_OR_DEPARTMENT_OTHER): Payer: Self-pay

## 2024-08-18 ENCOUNTER — Ambulatory Visit (HOSPITAL_BASED_OUTPATIENT_CLINIC_OR_DEPARTMENT_OTHER)

## 2024-08-18 ENCOUNTER — Ambulatory Visit

## 2024-08-18 ENCOUNTER — Inpatient Hospital Stay: Admitting: Oncology

## 2024-08-18 VITALS — BP 109/59 | HR 63 | Temp 98.6°F | Resp 18

## 2024-08-18 VITALS — BP 117/55 | HR 72 | Temp 97.8°F | Resp 18 | Ht 61.0 in | Wt 127.4 lb

## 2024-08-18 DIAGNOSIS — C210 Malignant neoplasm of anus, unspecified: Secondary | ICD-10-CM

## 2024-08-18 LAB — CBC WITH DIFFERENTIAL (CANCER CENTER ONLY)
Abs Immature Granulocytes: 0.15 10*3/uL — ABNORMAL HIGH (ref 0.00–0.07)
Basophils Absolute: 0 10*3/uL (ref 0.0–0.1)
Basophils Relative: 1 %
Eosinophils Absolute: 0.1 10*3/uL (ref 0.0–0.5)
Eosinophils Relative: 1 %
HCT: 24.6 % — ABNORMAL LOW (ref 36.0–46.0)
Hemoglobin: 7.9 g/dL — ABNORMAL LOW (ref 12.0–15.0)
Immature Granulocytes: 2 %
Lymphocytes Relative: 7 %
Lymphs Abs: 0.5 10*3/uL — ABNORMAL LOW (ref 0.7–4.0)
MCH: 29.5 pg (ref 26.0–34.0)
MCHC: 32.1 g/dL (ref 30.0–36.0)
MCV: 91.8 fL (ref 80.0–100.0)
Monocytes Absolute: 0.8 10*3/uL (ref 0.1–1.0)
Monocytes Relative: 12 %
Neutro Abs: 5 10*3/uL (ref 1.7–7.7)
Neutrophils Relative %: 77 %
Platelet Count: 289 10*3/uL (ref 150–400)
RBC: 2.68 MIL/uL — ABNORMAL LOW (ref 3.87–5.11)
RDW: 18.2 % — ABNORMAL HIGH (ref 11.5–15.5)
WBC Count: 6.5 10*3/uL (ref 4.0–10.5)
nRBC: 0 % (ref 0.0–0.2)

## 2024-08-18 LAB — CMP (CANCER CENTER ONLY)
ALT: 10 U/L (ref 0–44)
AST: 17 U/L (ref 15–41)
Albumin: 3.3 g/dL — ABNORMAL LOW (ref 3.5–5.0)
Alkaline Phosphatase: 86 U/L (ref 38–126)
Anion gap: 8 (ref 5–15)
BUN: 26 mg/dL — ABNORMAL HIGH (ref 8–23)
CO2: 28 mmol/L (ref 22–32)
Calcium: 9.8 mg/dL (ref 8.9–10.3)
Chloride: 96 mmol/L — ABNORMAL LOW (ref 98–111)
Creatinine: 0.5 mg/dL (ref 0.44–1.00)
GFR, Estimated: >60 mL/min
Glucose, Bld: 146 mg/dL — ABNORMAL HIGH (ref 70–99)
Potassium: 4.3 mmol/L (ref 3.5–5.1)
Sodium: 132 mmol/L — ABNORMAL LOW (ref 135–145)
Total Bilirubin: 0.4 mg/dL (ref 0.0–1.2)
Total Protein: 6.8 g/dL (ref 6.5–8.1)

## 2024-08-18 MED ORDER — MITOMYCIN CHEMO IV INJECTION 20 MG
6.0000 mg/m2 | Freq: Once | INTRAVENOUS | Status: AC
  Administered 2024-08-18: 9 mg via INTRAVENOUS
  Filled 2024-08-18: qty 18

## 2024-08-18 MED ORDER — SODIUM CHLORIDE 0.9 % IV SOLN: INTRAVENOUS | Status: DC

## 2024-08-18 MED ORDER — SODIUM CHLORIDE 0.9 % IV SOLN
600.0000 mg/m2/d | INTRAVENOUS | Status: DC
  Administered 2024-08-18: 3500 mg via INTRAVENOUS
  Filled 2024-08-18: qty 70

## 2024-08-18 MED ORDER — PROCHLORPERAZINE MALEATE 10 MG PO TABS
10.0000 mg | ORAL_TABLET | Freq: Once | ORAL | Status: AC
  Administered 2024-08-18: 10 mg via ORAL
  Filled 2024-08-18: qty 1

## 2024-08-18 NOTE — Patient Instructions (Signed)
 [facility] - A DEPT OF [facility]  Discharge Instructions: Thank you for choosing [facility] to provide your oncology and hematology care.   If you have a lab appointment with the Cancer Center, please go directly to the Cancer Center and check in at the registration area.   Wear comfortable clothing and clothing appropriate for easy access to any Portacath or PICC line.   We strive to give you quality time with your provider. You may need to reschedule your appointment if you arrive late (15 or more minutes).  Arriving late affects you and other patients whose appointments are after yours.  Also, if you miss three or more appointments without notifying the office, you may be dismissed from the clinic at the providers discretion.      For prescription refill requests, have your pharmacy contact our office and allow 72 hours for refills to be completed.    Today you received the following chemotherapy and/or immunotherapy agents: Mitomycin  (mutamycin ) and Fluorouracil  (adrucil , 5-FU)      To help prevent nausea and vomiting after your treatment, we encourage you to take your nausea medication as directed.  BELOW ARE SYMPTOMS THAT SHOULD BE REPORTED IMMEDIATELY: *FEVER GREATER THAN 100.4 F (38 C) OR HIGHER *CHILLS OR SWEATING *NAUSEA AND VOMITING THAT IS NOT CONTROLLED WITH YOUR NAUSEA MEDICATION *UNUSUAL SHORTNESS OF BREATH *UNUSUAL BRUISING OR BLEEDING *URINARY PROBLEMS (pain or burning when urinating, or frequent urination) *BOWEL PROBLEMS (unusual diarrhea, constipation, pain near the anus) TENDERNESS IN MOUTH AND THROAT WITH OR WITHOUT PRESENCE OF ULCERS (sore throat, sores in mouth, or a toothache) UNUSUAL RASH, SWELLING OR PAIN  UNUSUAL VAGINAL DISCHARGE OR ITCHING   Items with * indicate a potential emergency and should be followed up as soon as possible or go to the Emergency Department if any problems should occur.  Please show  the CHEMOTHERAPY ALERT CARD or IMMUNOTHERAPY ALERT CARD at check-in to the Emergency Department and triage nurse.  Should you have questions after your visit or need to cancel or reschedule your appointment, please contact [facility] - A DEPT OF [facility]  Dept: [phone]  and follow the prompts.  Office hours are 8:00 a.m. to 4:30 p.m. Monday - Friday. Please note that voicemails left after 4:00 p.m. may not be returned until the following business day.  We are closed weekends and major holidays. You have access to a nurse at all times for urgent questions. Please call the main number to the clinic Dept: [phone] and follow the prompts.   For any non-urgent questions, you may also contact your provider using MyChart. We now offer e-Visits for anyone 51 and older to request care online for non-urgent symptoms. For details visit [email or web address].   Also download the MyChart app! Go to the app store, search MyChart, open the app, select [facility], and log in with your MyChart username and password.  Mitomycin  Injection What is this medication? MITOMYCIN  (mye toe MYE sin) treats stomach cancer and pancreatic cancer. It works by slowing down the growth of cancer cells. This medicine may be used for other purposes; ask your health care provider or pharmacist if you have questions. COMMON BRAND NAME(S): Mutamycin  What should I tell my care team before I take this medication? They need to know if you have any of these conditions: Bleeding disorders Infection, such as chickenpox, cold sores, herpes Low blood counts, such as low white cells, platelets,  red blood cells Kidney disease An unusual or allergic reaction to mitomycin , other medications, foods, dyes, or preservatives Pregnant or trying to get pregnant Breastfeeding How should I use this medication? This medication is injected into a vein. It is given by your care team in a hospital or  clinic setting. Talk to your care team about the use of this medication in children. Special care may be needed. Overdosage: If you think you have taken too much of this medicine contact a poison control center or emergency room at once. NOTE: This medicine is only for you. Do not share this medicine with others. What if I miss a dose? Keep appointments for follow-up doses. It is important not to miss your dose. Call your care team if you are unable to keep an appointment. What may interact with this medication? Interactions are not expected. This list may not describe all possible interactions. Give your health care provider a list of all the medicines, herbs, non-prescription drugs, or dietary supplements you use. Also tell them if you smoke, drink alcohol, or use illegal drugs. Some items may interact with your medicine. What should I watch for while using this medication? Your condition will be monitored carefully while you are receiving this medication. You may need blood work while taking this medication. This medication may make you feel generally unwell. This is not uncommon as chemotherapy can affect healthy cells as well as cancer cells. Report any side effects. Continue your course of treatment even though you feel ill unless your care team tells you to stop. This medication may increase your risk of getting an infection. Call your care team for advice if you get a fever, chills, sore throat, or other symptoms of a cold or flu. Do not treat yourself. Try to avoid being around people who are sick. Avoid taking medications that contain aspirin , acetaminophen , ibuprofen, naproxen, or ketoprofen unless instructed by your care team. These medications may hide a fever. This medication may increase your risk to bruise or bleed. Call your care team if you notice any unusual bleeding. Be careful brushing or flossing your teeth or using a toothpick because you may get an infection or bleed more  easily. If you have any dental work done, tell your dentist you are receiving this medication. Talk to your care team if you may be pregnant. Serious birth defects can occur if you take this medication during pregnancy. Contraception is recommended while taking this medication. Your care team can help you find the option that works for you. Do not breastfeed while taking this medication. What side effects may I notice from receiving this medication? Side effects that you should report to your care team as soon as possible: Allergic reactions--skin rash, itching, hives, swelling of the face, lips, tongue, or throat Dry cough, shortness of breath or trouble breathing Infection--fever, chills, cough, sore throat, wounds that don't heal, pain or trouble when passing urine, general feeling of discomfort or being unwell Kidney injury--decrease in the amount of urine, swelling of the ankles, hands, or feet Low red blood cell level--unusual weakness or fatigue, dizziness, headache, trouble breathing Stomach pain, bloody diarrhea, pale skin, unusual weakness or fatigue, decrease in the amount of urine, which may be signs of hemolytic uremic syndrome Unusual bruising or bleeding Side effects that usually do not require medical attention (report these to your care team if they continue or are bothersome): Diarrhea Hair loss Loss of appetite with weight loss Nausea Pain, redness, or swelling with sores  inside the mouth or throat This list may not describe all possible side effects. Call your doctor for medical advice about side effects. You may report side effects to FDA at 1-800-FDA-1088. Where should I keep my medication? This medication is given in a hospital or clinic. It will not be stored at home. NOTE: This sheet is a summary. It may not cover all possible information. If you have questions about this medicine, talk to your doctor, pharmacist, or health care provider.  2024 Elsevier/Gold Standard  (2021-11-30 00:00:00)  Fluorouracil  Injection What is this medication? FLUOROURACIL  (flure oh YOOR a sil) treats some types of cancer. It works by slowing down the growth of cancer cells. This medicine may be used for other purposes; ask your health care provider or pharmacist if you have questions. COMMON BRAND NAME(S): Adrucil  What should I tell my care team before I take this medication? They need to know if you have any of these conditions: Blood disorders Dihydropyrimidine dehydrogenase (DPD) deficiency Infection, such as chickenpox, cold sores, herpes Kidney disease Liver disease Poor nutrition Recent or ongoing radiation therapy An unusual or allergic reaction to fluorouracil , other medications, foods, dyes, or preservatives If you or your partner are pregnant or trying to get pregnant Breast-feeding How should I use this medication? This medication is injected into a vein. It is administered by your care team in a hospital or clinic setting. Talk to your care team about the use of this medication in children. Special care may be needed. Overdosage: If you think you have taken too much of this medicine contact a poison control center or emergency room at once. NOTE: This medicine is only for you. Do not share this medicine with others. What if I miss a dose? Keep appointments for follow-up doses. It is important not to miss your dose. Call your care team if you are unable to keep an appointment. What may interact with this medication? Do not take this medication with any of the following: Live virus vaccines This medication may also interact with the following: Medications that treat or prevent blood clots, such as warfarin, enoxaparin, dalteparin This list may not describe all possible interactions. Give your health care provider a list of all the medicines, herbs, non-prescription drugs, or dietary supplements you use. Also tell them if you smoke, drink alcohol, or use illegal  drugs. Some items may interact with your medicine. What should I watch for while using this medication? Your condition will be monitored carefully while you are receiving this medication. This medication may make you feel generally unwell. This is not uncommon as chemotherapy can affect healthy cells as well as cancer cells. Report any side effects. Continue your course of treatment even though you feel ill unless your care team tells you to stop. In some cases, you may be given additional medications to help with side effects. Follow all directions for their use. This medication may increase your risk of getting an infection. Call your care team for advice if you get a fever, chills, sore throat, or other symptoms of a cold or flu. Do not treat yourself. Try to avoid being around people who are sick. This medication may increase your risk to bruise or bleed. Call your care team if you notice any unusual bleeding. Be careful brushing or flossing your teeth or using a toothpick because you may get an infection or bleed more easily. If you have any dental work done, tell your dentist you are receiving this medication. Avoid taking  medications that contain aspirin , acetaminophen , ibuprofen, naproxen, or ketoprofen unless instructed by your care team. These medications may hide a fever. Do not treat diarrhea with over the counter products. Contact your care team if you have diarrhea that lasts more than 2 days or if it is severe and watery. This medication can make you more sensitive to the sun. Keep out of the sun. If you cannot avoid being in the sun, wear protective clothing and sunscreen. Do not use sun lamps, tanning beds, or tanning booths. Talk to your care team if you or your partner wish to become pregnant or think you might be pregnant. This medication can cause serious birth defects if taken during pregnancy and for 3 months after the last dose. A reliable form of contraception is recommended while  taking this medication and for 3 months after the last dose. Talk to your care team about effective forms of contraception. Do not father a child while taking this medication and for 3 months after the last dose. Use a condom while having sex during this time period. Do not breastfeed while taking this medication. This medication may cause infertility. Talk to your care team if you are concerned about your fertility. What side effects may I notice from receiving this medication? Side effects that you should report to your care team as soon as possible: Allergic reactions--skin rash, itching, hives, swelling of the face, lips, tongue, or throat Heart attack--pain or tightness in the chest, shoulders, arms, or jaw, nausea, shortness of breath, cold or clammy skin, feeling faint or lightheaded Heart failure--shortness of breath, swelling of the ankles, feet, or hands, sudden weight gain, unusual weakness or fatigue Heart rhythm changes--fast or irregular heartbeat, dizziness, feeling faint or lightheaded, chest pain, trouble breathing High ammonia level--unusual weakness or fatigue, confusion, loss of appetite, nausea, vomiting, seizures Infection--fever, chills, cough, sore throat, wounds that don't heal, pain or trouble when passing urine, general feeling of discomfort or being unwell Low red blood cell level--unusual weakness or fatigue, dizziness, headache, trouble breathing Pain, tingling, or numbness in the hands or feet, muscle weakness, change in vision, confusion or trouble speaking, loss of balance or coordination, trouble walking, seizures Redness, swelling, and blistering of the skin over hands and feet Severe or prolonged diarrhea Unusual bruising or bleeding Side effects that usually do not require medical attention (report to your care team if they continue or are bothersome): Dry skin Headache Increased tears Nausea Pain, redness, or swelling with sores inside the mouth or  throat Sensitivity to light Vomiting This list may not describe all possible side effects. Call your doctor for medical advice about side effects. You may report side effects to FDA at 1-800-FDA-1088. Where should I keep my medication? This medication is given in a hospital or clinic. It will not be stored at home. NOTE: This sheet is a summary. It may not cover all possible information. If you have questions about this medicine, talk to your doctor, pharmacist, or health care provider.  2024 Elsevier/Gold Standard (2021-11-14 00:00:00)

## 2024-08-18 NOTE — Patient Instructions (Signed)
 PICC Home Care Guide A peripherally inserted central catheter (PICC) is a form of IV access that allows medicines and IV fluids to be quickly put into the blood and spread throughout the body. The PICC is a long, thin, flexible tube (catheter) that is put into a vein in a person's arm or leg. The catheter ends in a large vein just outside the heart called the superior vena cava (SVC). After the PICC is put in, a chest X-ray may be done to make sure that it is in the right place. A PICC may be placed for different reasons, such as: To give medicines and liquid nutrition. To give IV fluids and blood products. To take blood samples often. If there is trouble placing a peripheral intravenous (PIV) catheter. If cared for properly, a PICC can remain in place for many months. Having a PICC can allow you to go home from the hospital sooner and continue treatment at home. Medicines and PICC care can be managed at home by a family member, caregiver, or home health care team. What are the risks? Generally, having a PICC is safe. However, problems may occur, including: A blood clot (thrombus) forming in or at the end of the PICC. A blood clot forming in a vein (deep vein thrombosis) or traveling to the lung (pulmonary embolism). Inflammation of the vein (phlebitis) in which the PICC is placed. Infection at the insertion site or in the blood. Blood infections from central lines, like PICCs, can be serious and often require a hospital stay. PICC malposition, or PICC movement or poor placement. A break or cut in the PICC. Do not use scissors near the PICC. Nerve or tendon irritation or injury during PICC insertion. How to care for your PICC Please follow the specific guidelines provided by your health care provider. Preventing infection You and any caregivers should wash your hands often with soap and water for at least 20 seconds. Wash hands: Before touching the PICC or the infusion device. Before changing a  bandage (dressing). Do not change the dressing unless you have been taught to do so and have shown you are able to change it safely. Flush the PICC as told. Tell your health care provider right away if the PICC is hard to flush or does not flush. Do not use force to flush the PICC. Use clean and germ-free (sterile) supplies only. Keep the supplies in a dry place. Do not reuse needles, syringes, or any other supplies. Reusing supplies can lead to infection. Keep the PICC dressing dry and secure it with tape if the edges stop sticking to your skin. Check your PICC insertion site every day for signs of infection. Check for: Redness, swelling, or pain. Fluid or blood. Warmth. Pus or a bad smell. Preventing other problems Do not use a syringe that is less than 10 mL to flush the PICC. Do not have your blood pressure checked on the arm in which the PICC is placed. Do not ever pull or tug on the PICC. Keep it secured to your arm with tape or a stretch wrap when not in use. Do not take the PICC out yourself. Only a trained health care provider should remove the PICC. Keep pets and children away from your PICC. How to care for your PICC dressing Keep your PICC dressing clean and dry to prevent infection. Do not take baths, swim, or use a hot tub until your health care provider approves. Ask your health care provider if you can take  showers. You may only be allowed to take sponge baths. When you are allowed to shower: Ask your health care provider to teach you how to wrap the PICC. Cover the PICC with clear plastic wrap and tape to keep it dry while showering. Follow instructions from your health care provider about how to take care of your insertion site and dressing. Make sure you: Wash your hands with soap and water for at least 20 seconds before and after you change your dressing. If soap and water are not available, use hand sanitizer. Change your dressing only if taught to do so by your health care  provider. Your PICC dressing needs to be changed if it becomes loose or wet. Leave stitches (sutures), skin glue, or adhesive strips in place. These skin closures may need to stay in place for 2 weeks or longer. If adhesive strip edges start to loosen and curl up, you may trim the loose edges. Do not remove adhesive strips completely unless your health care provider tells you to do that. Follow these instructions at home: Disposal of supplies Throw away any syringes in a disposal container that is meant for sharp items (sharps container). You can buy a sharps container from a pharmacy, or you can make one by using an empty, hard plastic bottle with a lid. Place any used dressings or infusion bags into a plastic bag. Throw that bag in the trash. General instructions  Always carry your PICC identification card or wear a medical alert bracelet. Keep the tube clamped at all times, unless it is being used. Always carry a smooth-edge clamp with you to clamp the PICC if it breaks. Do not use scissors or sharp objects near the tube. You may bend your arm and move it freely. If your PICC is near or at the bend of your elbow, avoid activity with repeated motion at the elbow. Avoid lifting heavy objects as told by your health care provider. Keep all follow-up visits. This is important. You will need to have your PICC dressing changed at least once a week. Contact a health care provider if: You have pain in your arm, ear, face, or teeth. You have a fever or chills. You have redness, swelling, or pain around the insertion site. You have fluid or blood coming from the insertion site. Your insertion site feels warm to the touch. You have pus or a bad smell coming from the insertion site. Your skin feels hard and raised around the insertion site. Your PICC dressing has gotten wet or is coming off and you have not been taught how to change it. Get help right away if: You have problems with your PICC, such as  your PICC: Was tugged or pulled and has partially come out. Do not  push the PICC back in. Cannot be flushed, is hard to flush, or leaks around the insertion site when it is flushed. Makes a flushing sound when it is flushed. Appears to have a hole or tear. Is accidentally pulled all the way out. If this happens, cover the insertion site with a gauze dressing. Do not throw the PICC away. Your health care provider will need to check it to be sure the entire catheter came out. You feel your heart racing or skipping beats, or you have chest pain. You have shortness of breath or trouble breathing. You have swelling, redness, warmth, or pain in the arm in which the PICC is placed. You have a red streak going up your arm that  starts under the PICC dressing. These symptoms may be an emergency. Get help right away. Call 911. Do not wait to see if the symptoms will go away. Do not drive yourself to the hospital. Summary A peripherally inserted central catheter (PICC) is a long, thin, flexible tube (catheter) that is put into a vein in the arm or leg. If cared for properly, a PICC can remain in place for many months. Having a PICC can allow you to go home from the hospital sooner and continue treatment at home. The PICC is inserted using a germ-free (sterile) technique by a specially trained health care provider. Only a trained health care provider should remove it. Do not have your blood pressure checked on the arm in which your PICC is placed. Always keep your PICC identification card with you. This information is not intended to replace advice given to you by your health care provider. Make sure you discuss any questions you have with your health care provider. Document Revised: 01/25/2021 Document Reviewed: 01/25/2021 Elsevier Patient Education  2024 [employer].

## 2024-08-18 NOTE — Progress Notes (Signed)
 " [facility] OFFICE PROGRESS NOTE   Diagnosis: Anal cancer  INTERVAL HISTORY:   [name] returns as scheduled.  She is here with her son.  She continues daily radiation.  She has small volume loose stools.  She has pain at the perineum.  She feels weak.  She has intermittent nausea.  Objective:  Vital signs in last 24 hours:  Blood pressure (!) 117/55, pulse 72, temperature 97.8 F (36.6 C), temperature source Temporal, resp. rate 18, height 5' 1 (1.549 m), weight 127 lb 6.4 oz (57.8 kg), SpO2 97%.    HEENT: The tongue is dry, no thrush or ulcers Resp: Lungs clear bilaterally Cardio: Regular rate and rhythm GI: Soft, no hepatosplenomegaly Vascular: Edema at the right lower leg, no erythema or tenderness  Skin: Erythema with superficial skin breakdown at the perineum, loose brown stool coating the perineum, raised lesion at the left side of the anal verge  Portacath/PICC-without erythema  Lab Results:  Lab Results  Component Value Date   WBC 6.5 08/18/2024   HGB 7.9 (L) 08/18/2024   HCT 24.6 (L) 08/18/2024   MCV 91.8 08/18/2024   PLT 289 08/18/2024   NEUTROABS 5.0 08/18/2024    CMP  Lab Results  Component Value Date   NA 132 (L) 08/18/2024   K 4.3 08/18/2024   CL 96 (L) 08/18/2024   CO2 28 08/18/2024   GLUCOSE 146 (H) 08/18/2024   BUN 26 (H) 08/18/2024   CREATININE 0.50 08/18/2024   CALCIUM  9.8 08/18/2024   PROT 6.8 08/18/2024   ALBUMIN 3.3 (L) 08/18/2024   AST 17 08/18/2024   ALT 10 08/18/2024   ALKPHOS 86 08/18/2024   BILITOT 0.4 08/18/2024   GFRNONAA >60 08/18/2024   GFRAA  05/07/2007    >60        The eGFR has been calculated using the MDRD equation. This calculation has not been validated in all clinical    Lab Results  Component Value Date   CEA 2.0 06/02/2024     Medications: I have reviewed the patient's current medications.   Assessment/Plan:  Anal cancer Rectal exam by Dr. Debby 06/23/2024-left lateral firm  fixed mass, biopsy showed well-differentiated squamous cell carcinoma CTs 06/28/2024-prominent irregularity and abnormal enhancement along the anal canal; possible ulceration along the anal canal with irregular somewhat patulous appearance.  The area of irregularity measured about 4.2 x 3.7 x 3.9 cm.  A 0.4 cm perirectal lymph node noted along the left posterior perirectal adipose tissue; left external iliac node 0.8 cm. PET scan 07/08/2024-hypermetabolic distal anal primary with direct extension and involvement of the left gluteal crease.  Left external iliac and inguinal small but hypermetabolic nodes suspicious for metastatic disease.  Mild hypermetabolism involving the L1 vertebral body without CT correlate. Radiation 07/20/2024 Cycle one 5-FU/Mitomycin -C 07/20/2024 Cycle two 5-FU/Mitomycin -C 08/18/2024, 5-FU and Mitomycin -C dose reduced secondary to pancytopenia and mucositis following cycle 1 Rectal pain and bleeding secondary to #1 History of colon cancer status post right colectomy 05/06/2007, T3 N0 Hypothyroid Prediabetes B12 deficiency Neutropenia/thrombocytopenia secondary to chemotherapy-resolved   Disposition: Whitney [name] has anal cancer.  She is completing treatment with concurrent chemotherapy and radiation.  Cycle 1 chemotherapy was complicated by severe neutropenia and thrombocytopenia.  The white count and platelets have recovered.  She has severe anemia, likely secondary to chemotherapy/radiation, subclinical rectal bleeding, and decreased nutrition.  She has skin breakdown at the perineum.  She will the skin clean and continue Tylenol /oxycodone  as needed for pain.  The  plan is to complete cycle two 5-FU/Mitomycin -C today.  The chemotherapy will be dose reduced with this cycle.  There is swelling of the right lower leg.  She will be referred for a Doppler today.  She does not wish to have a red cell transfusion today.  She will call for symptoms of anemia.  Ms.  Pownall will return for an office and lab visit next week.  Arley Hof, MD  08/18/2024  12:10 PM   "

## 2024-08-18 NOTE — Progress Notes (Signed)
 Patient seen by Dr. Arley Hof today  Vitals are within treatment parameters:Yes   Labs are within treatment parameters: Yes OK to proceed w/Hgb 7.9 (declines transfusion)  Treatment plan has been signed: Yes   Per physician team, Patient is ready for treatment. Please note the following modifications: Dose reduction of Mitomycin  and 5FU.

## 2024-08-19 ENCOUNTER — Other Ambulatory Visit: Payer: Self-pay

## 2024-08-19 ENCOUNTER — Other Ambulatory Visit (HOSPITAL_BASED_OUTPATIENT_CLINIC_OR_DEPARTMENT_OTHER): Payer: Self-pay

## 2024-08-19 ENCOUNTER — Ambulatory Visit
Admission: RE | Admit: 2024-08-19 | Discharge: 2024-08-19 | Disposition: A | Source: Ambulatory Visit | Attending: Radiation Oncology

## 2024-08-19 LAB — RAD ONC ARIA SESSION SUMMARY
Course Elapsed Days: 30
Plan Fractions Treated to Date: 20
Plan Prescribed Dose Per Fraction: 1.8 Gy
Plan Total Fractions Prescribed: 30
Plan Total Prescribed Dose: 54 Gy
Reference Point Dosage Given to Date: 36 Gy
Reference Point Session Dosage Given: 1.8 Gy
Session Number: 20

## 2024-08-20 ENCOUNTER — Ambulatory Visit

## 2024-08-21 ENCOUNTER — Other Ambulatory Visit: Payer: Self-pay

## 2024-08-21 ENCOUNTER — Ambulatory Visit
Admission: RE | Admit: 2024-08-21 | Discharge: 2024-08-21 | Disposition: A | Source: Ambulatory Visit | Attending: Radiation Oncology

## 2024-08-21 ENCOUNTER — Other Ambulatory Visit (HOSPITAL_COMMUNITY)

## 2024-08-21 ENCOUNTER — Telehealth: Payer: Self-pay

## 2024-08-21 ENCOUNTER — Inpatient Hospital Stay

## 2024-08-21 LAB — RAD ONC ARIA SESSION SUMMARY
Course Elapsed Days: 32
Plan Fractions Treated to Date: 21
Plan Prescribed Dose Per Fraction: 1.8 Gy
Plan Total Fractions Prescribed: 30
Plan Total Prescribed Dose: 54 Gy
Reference Point Dosage Given to Date: 37.8 Gy
Reference Point Session Dosage Given: 1.8 Gy
Session Number: 21

## 2024-08-21 NOTE — Telephone Encounter (Signed)
 Spoke directly with the patients son, who requested to move the pump stop appointment from Saturday, 08/22/24 to Monday, 08/24/24, preferably close to the patients scheduled radiation treatment time. Coordinated with [employer]) and successfully rescheduled the pump stop appointment as requested. Patients son was notified of the updated plan and verbalized understanding.

## 2024-08-22 ENCOUNTER — Inpatient Hospital Stay

## 2024-08-24 ENCOUNTER — Inpatient Hospital Stay

## 2024-08-24 ENCOUNTER — Ambulatory Visit

## 2024-08-25 ENCOUNTER — Ambulatory Visit
Admission: RE | Admit: 2024-08-25 | Discharge: 2024-08-25 | Disposition: A | Source: Ambulatory Visit | Attending: Radiation Oncology

## 2024-08-25 ENCOUNTER — Other Ambulatory Visit: Payer: Self-pay

## 2024-08-25 ENCOUNTER — Inpatient Hospital Stay

## 2024-08-25 ENCOUNTER — Other Ambulatory Visit: Payer: Self-pay | Admitting: *Deleted

## 2024-08-25 ENCOUNTER — Other Ambulatory Visit: Payer: Self-pay | Admitting: Nurse Practitioner

## 2024-08-25 DIAGNOSIS — C210 Malignant neoplasm of anus, unspecified: Secondary | ICD-10-CM

## 2024-08-25 LAB — RAD ONC ARIA SESSION SUMMARY
Course Elapsed Days: 36
Plan Fractions Treated to Date: 22
Plan Prescribed Dose Per Fraction: 1.8 Gy
Plan Total Fractions Prescribed: 30
Plan Total Prescribed Dose: 54 Gy
Reference Point Dosage Given to Date: 39.6 Gy
Reference Point Session Dosage Given: 1.8 Gy
Session Number: 22

## 2024-08-25 NOTE — Progress Notes (Signed)
 Patient discharged with her son- FWW to lobby. A/OX4 with good color. Good radial pulse. No bleeding from PICC site. VSS- BP (!) 132/57 (BP Location: Left Arm, Patient Position: Sitting)   Pulse 82   Temp 97.8 F (36.6 C) (Oral)   Resp 16   SpO2 95%

## 2024-08-25 NOTE — Patient Instructions (Signed)
 Removing a PICC Line (PICC Removal) in Adults: What to Know After After having a soft tube called a peripherally inserted central catheter (PICC) taken out, it's common to have some tenderness or soreness at the site where the PICC was removed. You may also have: Redness. Swelling. A scab. Follow these instructions at home: For the first 24 hours after the PICC is removed: Keep the bandage over the site clean and dry. Do not remove the bandage until told. Do not lift or do things that take a lot of effort until your health care provider says it's OK. Avoid: Lifting weights. Doing yard work. Doing things with repeated arm movements. Watch for any signs of an air bubble in your vein (air embolism). This is rare but dangerous. Signs may include: Trouble breathing. Making a high-pitched whistling sound when you breathe out (wheezing). Chest pain. A fast heart rate. If you have signs of an air embolism: Call 911 right away. Lie down on your left side. After 24 hours have passed:  Remove your bandage as told. Wash your hands with soap and water for at least 20 seconds before and after you remove the bandage. If you can't use soap and water, use hand sanitizer. A small scab may form over the site. Do not pick at the scab. Do not take baths, swim, or use a hot tub until you're told it's OK. Ask if you can shower. The site can be gently washed with soap and water and patted dry. Check the area around your site every day for signs of infection. Check for: Redness, swelling, or pain. Fluid or blood. Warmth. Pus or a bad smell. A red streak spreading away from your site. Ask what things are safe for you to do at home. Ask when you can go back to work or school. General instructions Take your medicines only as told. Contact a health care provider if: You have a fever or chills. You have swelling at the site or swollen glands under your arm. You have soreness, redness, or swelling in your  arm that gets worse. You have any signs of infection. Get help right away if: You have numbness or tingling in your fingers, hand, or arm. Your arm looks blue and feels cold. You have signs of an air embolism. These symptoms may be an emergency. Call 911 right away. Do not wait to see if the symptoms will go away. Do not drive yourself to the hospital. This information is not intended to replace advice given to you by your health care provider. Make sure you discuss any questions you have with your health care provider. Document Revised: 04/15/2024 Document Reviewed: 04/15/2024 Elsevier Patient Education  2025 [employer].

## 2024-08-26 ENCOUNTER — Other Ambulatory Visit: Payer: Self-pay | Admitting: Nurse Practitioner

## 2024-08-26 ENCOUNTER — Other Ambulatory Visit: Payer: Self-pay | Admitting: *Deleted

## 2024-08-26 ENCOUNTER — Other Ambulatory Visit: Payer: Self-pay

## 2024-08-26 ENCOUNTER — Telehealth: Payer: Self-pay | Admitting: Nurse Practitioner

## 2024-08-26 ENCOUNTER — Inpatient Hospital Stay

## 2024-08-26 ENCOUNTER — Ambulatory Visit
Admission: RE | Admit: 2024-08-26 | Discharge: 2024-08-26 | Disposition: A | Source: Ambulatory Visit | Attending: Radiation Oncology

## 2024-08-26 DIAGNOSIS — C210 Malignant neoplasm of anus, unspecified: Secondary | ICD-10-CM

## 2024-08-26 LAB — RAD ONC ARIA SESSION SUMMARY
Course Elapsed Days: 37
Plan Fractions Treated to Date: 23
Plan Prescribed Dose Per Fraction: 1.8 Gy
Plan Total Fractions Prescribed: 30
Plan Total Prescribed Dose: 54 Gy
Reference Point Dosage Given to Date: 41.4 Gy
Reference Point Session Dosage Given: 1.8 Gy
Session Number: 23

## 2024-08-26 MED ORDER — OXYCODONE HCL 5 MG PO TABS: 5.0000 mg | ORAL_TABLET | ORAL | 0 refills | Status: AC | PRN

## 2024-08-26 NOTE — Telephone Encounter (Signed)
 Son called to request refill on her oxycodone .

## 2024-08-26 NOTE — Telephone Encounter (Signed)
 Called Pt's son to update about lab appt for 2/5. Day and time confirmed.

## 2024-08-27 ENCOUNTER — Inpatient Hospital Stay

## 2024-08-27 ENCOUNTER — Ambulatory Visit

## 2024-08-27 ENCOUNTER — Other Ambulatory Visit: Payer: Self-pay

## 2024-08-27 ENCOUNTER — Ambulatory Visit
Admission: RE | Admit: 2024-08-27 | Discharge: 2024-08-27 | Disposition: A | Source: Ambulatory Visit | Attending: Radiation Oncology

## 2024-08-27 ENCOUNTER — Ambulatory Visit: Payer: Self-pay | Admitting: Oncology

## 2024-08-27 DIAGNOSIS — C210 Malignant neoplasm of anus, unspecified: Secondary | ICD-10-CM

## 2024-08-27 LAB — BASIC METABOLIC PANEL - CANCER CENTER ONLY
Anion gap: 11 (ref 5–15)
BUN: 16 mg/dL (ref 8–23)
CO2: 25 mmol/L (ref 22–32)
Calcium: 9.2 mg/dL (ref 8.9–10.3)
Chloride: 94 mmol/L — ABNORMAL LOW (ref 98–111)
Creatinine: 0.55 mg/dL (ref 0.44–1.00)
GFR, Estimated: >60 mL/min
Glucose, Bld: 137 mg/dL — ABNORMAL HIGH (ref 70–99)
Potassium: 3.8 mmol/L (ref 3.5–5.1)
Sodium: 131 mmol/L — ABNORMAL LOW (ref 135–145)

## 2024-08-27 LAB — RAD ONC ARIA SESSION SUMMARY
Course Elapsed Days: 38
Plan Fractions Treated to Date: 24
Plan Prescribed Dose Per Fraction: 1.8 Gy
Plan Total Fractions Prescribed: 30
Plan Total Prescribed Dose: 54 Gy
Reference Point Dosage Given to Date: 43.2 Gy
Reference Point Session Dosage Given: 1.8 Gy
Session Number: 24

## 2024-08-27 LAB — CBC WITH DIFFERENTIAL (CANCER CENTER ONLY)
Abs Immature Granulocytes: 0.01 10*3/uL (ref 0.00–0.07)
Basophils Absolute: 0 10*3/uL (ref 0.0–0.1)
Basophils Relative: 1 %
Eosinophils Absolute: 0.1 10*3/uL (ref 0.0–0.5)
Eosinophils Relative: 2 %
HCT: 24.3 % — ABNORMAL LOW (ref 36.0–46.0)
Hemoglobin: 8.3 g/dL — ABNORMAL LOW (ref 12.0–15.0)
Immature Granulocytes: 0 %
Lymphocytes Relative: 6 %
Lymphs Abs: 0.2 10*3/uL — ABNORMAL LOW (ref 0.7–4.0)
MCH: 31.1 pg (ref 26.0–34.0)
MCHC: 34.2 g/dL (ref 30.0–36.0)
MCV: 91 fL (ref 80.0–100.0)
Monocytes Absolute: 0.3 10*3/uL (ref 0.1–1.0)
Monocytes Relative: 8 %
Neutro Abs: 2.8 10*3/uL (ref 1.7–7.7)
Neutrophils Relative %: 83 %
Platelet Count: 105 10*3/uL — ABNORMAL LOW (ref 150–400)
RBC: 2.67 MIL/uL — ABNORMAL LOW (ref 3.87–5.11)
RDW: 20.9 % — ABNORMAL HIGH (ref 11.5–15.5)
WBC Count: 3.4 10*3/uL — ABNORMAL LOW (ref 4.0–10.5)
nRBC: 0 % (ref 0.0–0.2)

## 2024-08-27 LAB — SAMPLE TO BLOOD BANK

## 2024-08-28 ENCOUNTER — Ambulatory Visit

## 2024-08-28 ENCOUNTER — Encounter: Payer: Self-pay | Admitting: Oncology

## 2024-08-28 ENCOUNTER — Ambulatory Visit: Admission: RE | Admit: 2024-08-28 | Source: Ambulatory Visit

## 2024-08-28 ENCOUNTER — Other Ambulatory Visit: Payer: Self-pay

## 2024-08-28 LAB — RAD ONC ARIA SESSION SUMMARY
Course Elapsed Days: 39
Plan Fractions Treated to Date: 25
Plan Prescribed Dose Per Fraction: 1.8 Gy
Plan Total Fractions Prescribed: 30
Plan Total Prescribed Dose: 54 Gy
Reference Point Dosage Given to Date: 45 Gy
Reference Point Session Dosage Given: 0.0404 Gy
Session Number: 25

## 2024-08-28 NOTE — Telephone Encounter (Signed)
-----   Message from Arley Hof, MD sent at 08/27/2024  4:50 PM EST ----- Please call patient, the neutrophil count remains normal, platelets are mildly low, call for bleeding, follow-up as scheduled

## 2024-08-28 NOTE — Telephone Encounter (Signed)
 LVM and sent Mychart message that WBC is normal and platelets are mildly low. Call for bleeding and f/u as scheduled with Dr. Cloretta on 08/31/24

## 2024-08-31 ENCOUNTER — Inpatient Hospital Stay

## 2024-08-31 ENCOUNTER — Ambulatory Visit

## 2024-08-31 ENCOUNTER — Inpatient Hospital Stay: Admitting: Nurse Practitioner

## 2024-09-01 ENCOUNTER — Ambulatory Visit

## 2024-09-02 ENCOUNTER — Ambulatory Visit

## 2024-09-03 ENCOUNTER — Ambulatory Visit

## 2024-09-04 ENCOUNTER — Ambulatory Visit
